# Patient Record
Sex: Female | Born: 1955 | Race: White | Hispanic: No | Marital: Single | Smoking: Never smoker
Health system: Southern US, Community
[De-identification: ages and names within clinical notes are randomized; demographics above are authoritative.]

## PROBLEM LIST (undated history)

## (undated) ENCOUNTER — Emergency Department (HOSPITAL_BASED_OUTPATIENT_CLINIC_OR_DEPARTMENT_OTHER): Admission: EM | Payer: Worker's Compensation | Source: Home / Self Care

## (undated) DIAGNOSIS — N390 Urinary tract infection, site not specified: Secondary | ICD-10-CM

## (undated) DIAGNOSIS — E119 Type 2 diabetes mellitus without complications: Secondary | ICD-10-CM

## (undated) DIAGNOSIS — G43909 Migraine, unspecified, not intractable, without status migrainosus: Secondary | ICD-10-CM

## (undated) DIAGNOSIS — K3184 Gastroparesis: Secondary | ICD-10-CM

## (undated) DIAGNOSIS — N186 End stage renal disease: Secondary | ICD-10-CM

## (undated) DIAGNOSIS — K7460 Unspecified cirrhosis of liver: Secondary | ICD-10-CM

## (undated) DIAGNOSIS — E1140 Type 2 diabetes mellitus with diabetic neuropathy, unspecified: Secondary | ICD-10-CM

## (undated) DIAGNOSIS — E785 Hyperlipidemia, unspecified: Secondary | ICD-10-CM

## (undated) DIAGNOSIS — N189 Chronic kidney disease, unspecified: Secondary | ICD-10-CM

## (undated) DIAGNOSIS — N3010 Interstitial cystitis (chronic) without hematuria: Secondary | ICD-10-CM

## (undated) HISTORY — PX: TOE SURGERY: SHX1073

## (undated) HISTORY — PX: ABDOMINAL HYSTERECTOMY: SHX81

## (undated) HISTORY — PX: ESOPHAGEAL DILATION: SHX303

## (undated) HISTORY — PX: APPENDECTOMY: SHX54

## (undated) HISTORY — PX: FINGER SURGERY: SHX640

## (undated) HISTORY — PX: ABDOMINAL SURGERY: SHX537

---

## 2010-02-18 ENCOUNTER — Emergency Department (HOSPITAL_BASED_OUTPATIENT_CLINIC_OR_DEPARTMENT_OTHER): Admission: EM | Admit: 2010-02-18 | Discharge: 2010-02-18 | Payer: Self-pay | Admitting: Emergency Medicine

## 2010-12-04 LAB — URINALYSIS, ROUTINE W REFLEX MICROSCOPIC
Bilirubin Urine: NEGATIVE
Glucose, UA: >1000 mg/dL — AB
Hgb urine dipstick: NEGATIVE
Specific Gravity, Urine: 1.027 (ref 1.005–1.030)
pH: 6 (ref 5.0–8.0)

## 2010-12-04 LAB — COMPREHENSIVE METABOLIC PANEL
ALT: 52 U/L — ABNORMAL HIGH (ref 0–35)
AST: 93 U/L — ABNORMAL HIGH (ref 0–37)
CO2: 22 mEq/L (ref 19–32)
Calcium: 8.6 mg/dL (ref 8.4–10.5)
Creatinine, Ser: 0.7 mg/dL (ref 0.4–1.2)
GFR calc Af Amer: >60 mL/min (ref >60)
GFR calc non Af Amer: >60 mL/min (ref >60)
Sodium: 136 mEq/L (ref 135–145)
Total Protein: 6.9 g/dL (ref 6.0–8.3)

## 2010-12-04 LAB — DIFFERENTIAL
Eosinophils Absolute: 0.1 10*3/uL (ref 0.0–0.7)
Lymphocytes Relative: 25 % (ref 12–46)
Lymphs Abs: 1.5 10*3/uL (ref 0.7–4.0)
Monocytes Relative: 7 % (ref 3–12)
Neutrophils Relative %: 66 % (ref 43–77)

## 2010-12-04 LAB — URINE MICROSCOPIC-ADD ON

## 2010-12-04 LAB — AMMONIA: Ammonia: 16 umol/L (ref 11–35)

## 2010-12-04 LAB — LIPASE, BLOOD: Lipase: 133 U/L (ref 23–300)

## 2010-12-04 LAB — GLUCOSE, CAPILLARY: Glucose-Capillary: 458 mg/dL — ABNORMAL HIGH (ref 70–99)

## 2010-12-04 LAB — CBC
MCHC: 34.2 g/dL (ref 30.0–36.0)
MCV: 90.1 fL (ref 78.0–100.0)
RDW: 13.2 % (ref 11.5–15.5)

## 2013-08-05 ENCOUNTER — Emergency Department (HOSPITAL_BASED_OUTPATIENT_CLINIC_OR_DEPARTMENT_OTHER)
Admission: EM | Admit: 2013-08-05 | Discharge: 2013-08-05 | Disposition: A | Discharge status: Home / Self Care | Payer: Medicare Other | Attending: Emergency Medicine | Admitting: Emergency Medicine

## 2013-08-05 ENCOUNTER — Encounter (HOSPITAL_BASED_OUTPATIENT_CLINIC_OR_DEPARTMENT_OTHER): Payer: Self-pay | Admitting: Emergency Medicine

## 2013-08-05 DIAGNOSIS — R000 Tachycardia, unspecified: Secondary | ICD-10-CM | POA: Insufficient documentation

## 2013-08-05 DIAGNOSIS — E119 Type 2 diabetes mellitus without complications: Secondary | ICD-10-CM | POA: Insufficient documentation

## 2013-08-05 DIAGNOSIS — G43909 Migraine, unspecified, not intractable, without status migrainosus: Secondary | ICD-10-CM | POA: Insufficient documentation

## 2013-08-05 DIAGNOSIS — E860 Dehydration: Secondary | ICD-10-CM | POA: Insufficient documentation

## 2013-08-05 DIAGNOSIS — Z8719 Personal history of other diseases of the digestive system: Secondary | ICD-10-CM | POA: Insufficient documentation

## 2013-08-05 DIAGNOSIS — M542 Cervicalgia: Secondary | ICD-10-CM | POA: Insufficient documentation

## 2013-08-05 HISTORY — DX: Type 2 diabetes mellitus without complications: E11.9

## 2013-08-05 HISTORY — DX: Gastroparesis: K31.84

## 2013-08-05 HISTORY — DX: Migraine, unspecified, not intractable, without status migrainosus: G43.909

## 2013-08-05 HISTORY — DX: Hyperlipidemia, unspecified: E78.5

## 2013-08-05 LAB — CBC WITH DIFFERENTIAL/PLATELET
Basophils Absolute: 0 10*3/uL (ref 0.0–0.1)
Eosinophils Absolute: 0 10*3/uL (ref 0.0–0.7)
Lymphocytes Relative: 16 % (ref 12–46)
MCHC: 33.3 g/dL (ref 30.0–36.0)
Monocytes Relative: 9 % (ref 3–12)
Neutrophils Relative %: 75 % (ref 43–77)
Platelets: 115 10*3/uL — ABNORMAL LOW (ref 150–400)
RDW: 13.1 % (ref 11.5–15.5)
Smear Review: ADEQUATE
WBC: 7.4 10*3/uL (ref 4.0–10.5)

## 2013-08-05 LAB — BASIC METABOLIC PANEL
BUN: 18 mg/dL (ref 6–23)
Chloride: 102 mEq/L (ref 96–112)
GFR calc Af Amer: 51 mL/min — ABNORMAL LOW (ref >90)
GFR calc non Af Amer: 44 mL/min — ABNORMAL LOW (ref >90)
Potassium: 3.6 mEq/L (ref 3.5–5.1)
Sodium: 137 mEq/L (ref 135–145)

## 2013-08-05 MED ORDER — DEXAMETHASONE SODIUM PHOSPHATE 10 MG/ML IJ SOLN
10.0000 mg | Freq: Once | INTRAMUSCULAR | Status: AC
  Administered 2013-08-05: 10 mg via INTRAVENOUS
  Filled 2013-08-05: qty 1

## 2013-08-05 MED ORDER — DIPHENHYDRAMINE HCL 50 MG/ML IJ SOLN
25.0000 mg | Freq: Once | INTRAMUSCULAR | Status: AC
  Administered 2013-08-05: 16:00:00 via INTRAVENOUS
  Filled 2013-08-05: qty 1

## 2013-08-05 MED ORDER — HYDROMORPHONE HCL PF 1 MG/ML IJ SOLN
INTRAMUSCULAR | Status: AC
  Administered 2013-08-05: 1 mg via INTRAVENOUS
  Filled 2013-08-05: qty 1

## 2013-08-05 MED ORDER — ONDANSETRON HCL 4 MG/2ML IJ SOLN: 4.0000 mg | Freq: Once | INTRAMUSCULAR | Status: DC

## 2013-08-05 MED ORDER — METOCLOPRAMIDE HCL 5 MG/ML IJ SOLN
10.0000 mg | Freq: Once | INTRAMUSCULAR | Status: AC
  Administered 2013-08-05: 10 mg via INTRAVENOUS
  Filled 2013-08-05: qty 2

## 2013-08-05 MED ORDER — HYDROMORPHONE HCL PF 1 MG/ML IJ SOLN
1.0000 mg | Freq: Once | INTRAMUSCULAR | Status: AC
  Administered 2013-08-05: 1 mg via INTRAVENOUS

## 2013-08-05 MED ORDER — SODIUM CHLORIDE 0.9 % IV BOLUS (SEPSIS)
1000.0000 mL | Freq: Once | INTRAVENOUS | Status: AC
  Administered 2013-08-05: 1000 mL via INTRAVENOUS

## 2013-08-05 NOTE — ED Provider Notes (Addendum)
CSN: [id number]     Arrival date & time 08/05/13  1505 History   First MD Initiated Contact with Patient 08/05/13 1520     Chief Complaint  Patient presents with  . Migraine   (Consider location/radiation/quality/duration/timing/severity/associated sxs/prior Treatment) Patient is a 57 y.o. female presenting with migraines. The history is provided by the patient.  Migraine This is a recurrent problem. Episode onset: 1 week ago. The problem occurs constantly. The problem has been gradually worsening. Associated symptoms include headaches. Pertinent negatives include no abdominal pain. Associated symptoms comments: N/V, neck pain, balance problems today when headache became severe.  States she has had headaches like this in the past but it has been awhile. The symptoms are aggravated by exertion, stress and eating (light). Nothing relieves the symptoms. Treatments tried: maxalt, ibuprofen, reglan. The treatment provided no relief.    Past Medical History  Diagnosis Date  . Migraine   . Diabetes mellitus without complication   . Gastroparesis   . Hyperlipemia    Past Surgical History  Procedure Laterality Date  . Abdominal hysterectomy    . Appendectomy    . Abdominal surgery     History reviewed. No pertinent family history. History  Substance Use Topics  . Smoking status: Never Smoker   . Smokeless tobacco: Not on file  . Alcohol Use: No   OB History   Grav Para Term Preterm Abortions TAB SAB Ect Mult Living                 Review of Systems  Constitutional: Negative for fever and chills.  Gastrointestinal: Positive for nausea and vomiting. Negative for abdominal pain.  Musculoskeletal: Positive for neck pain.  Neurological: Positive for headaches.  All other systems reviewed and are negative.    Allergies  Sulfa antibiotics  Home Medications  No current outpatient prescriptions on file. BP 154/77  Pulse 120  Temp(Src) 99.9 F (37.7 C)  Resp 18  Ht 5\' 2"  (1.575  m)  Wt 144 lb (65.318 kg)  BMI 26.33 kg/m2  SpO2 97% Physical Exam  Nursing note and vitals reviewed. Constitutional: She is oriented to person, place, and time. She appears well-developed and well-nourished. No distress.  HENT:  Head: Normocephalic and atraumatic.  Mouth/Throat: Oropharynx is clear and moist.  Eyes: Conjunctivae and EOM are normal. Pupils are equal, round, and reactive to light.  photophobia  Neck: Normal range of motion. Neck supple. No rigidity. Normal range of motion present. No Brudzinski's sign and no Kernig's sign noted.    Pt is able to completely flex and ext the neck without restriction.  No meningeal signs  Cardiovascular: Regular rhythm and intact distal pulses.  Tachycardia present.   No murmur heard. Pulmonary/Chest: Effort normal and breath sounds normal. No respiratory distress. She has no wheezes. She has no rales.  Abdominal: Soft. She exhibits no distension. There is no tenderness. There is no rebound and no guarding.  Musculoskeletal: Normal range of motion. She exhibits no edema and no tenderness.  Neurological: She is alert and oriented to person, place, and time. She has normal strength. No cranial nerve deficit or sensory deficit.  Skin: Skin is warm and dry. No rash noted. No erythema.  Psychiatric: She has a normal mood and affect. Her behavior is normal.    ED Course  Procedures (including critical care time) Labs Review Labs Reviewed  CBC WITH DIFFERENTIAL - Abnormal; Notable for the following:    Hemoglobin 11.5 (*)    HCT 34.5 (*)  Platelets 115 (*)    All other components within normal limits  BASIC METABOLIC PANEL - Abnormal; Notable for the following:    Glucose, Bld 135 (*)    Creatinine, Ser 1.30 (*)    GFR calc non Af Amer 44 (*)    GFR calc Af Amer 51 (*)    All other components within normal limits   Imaging Review No results found.  EKG Interpretation   None       MDM   1. Migraine   2. Dehydration      Pt withhx of migraine HA for years who not has had HA for 1week that continues to worsen with trouble with balance and N/V without sx suggestive of SAH(sudden onset, worst of life, or deficits), infection, or cavernous vein thrombosis.  Normal neuro exam.  Tachycardic here at 120 and appears dehydrated.  Pt denies fever at home 99.9 here. Will give HA cocktail and given extent of HA and hx of DM with gastroparesis and N/V will check CBC, BMP to ensure normal electrolytes.  4:34 PM Labs wnl.  After fluids and HA cocktail and pain medications pt feels much better.  She is eating and drinking and now has no complaints.  Tachycardia improved.   Blanchie Dessert, MD 08/05/13 1635  Blanchie Dessert, MD 08/05/13 [place], MD 08/05/13 1654

## 2013-08-05 NOTE — ED Notes (Signed)
Pt c/o " migraine" x 1 week

## 2013-08-07 ENCOUNTER — Emergency Department (HOSPITAL_BASED_OUTPATIENT_CLINIC_OR_DEPARTMENT_OTHER): Payer: Medicare Other

## 2013-08-07 ENCOUNTER — Encounter (HOSPITAL_BASED_OUTPATIENT_CLINIC_OR_DEPARTMENT_OTHER): Payer: Self-pay | Admitting: Emergency Medicine

## 2013-08-07 ENCOUNTER — Emergency Department (HOSPITAL_BASED_OUTPATIENT_CLINIC_OR_DEPARTMENT_OTHER)
Admission: EM | Admit: 2013-08-07 | Discharge: 2013-08-07 | Disposition: A | Discharge status: Other Acute Inpatient Hospital | Payer: Medicare Other | Attending: Emergency Medicine | Admitting: Emergency Medicine

## 2013-08-07 DIAGNOSIS — G43909 Migraine, unspecified, not intractable, without status migrainosus: Secondary | ICD-10-CM | POA: Insufficient documentation

## 2013-08-07 DIAGNOSIS — Z79899 Other long term (current) drug therapy: Secondary | ICD-10-CM | POA: Insufficient documentation

## 2013-08-07 DIAGNOSIS — E119 Type 2 diabetes mellitus without complications: Secondary | ICD-10-CM | POA: Insufficient documentation

## 2013-08-07 DIAGNOSIS — R509 Fever, unspecified: Secondary | ICD-10-CM | POA: Insufficient documentation

## 2013-08-07 DIAGNOSIS — E785 Hyperlipidemia, unspecified: Secondary | ICD-10-CM | POA: Insufficient documentation

## 2013-08-07 DIAGNOSIS — Z8719 Personal history of other diseases of the digestive system: Secondary | ICD-10-CM | POA: Insufficient documentation

## 2013-08-07 HISTORY — DX: Unspecified cirrhosis of liver: K74.60

## 2013-08-07 LAB — URINE MICROSCOPIC-ADD ON

## 2013-08-07 LAB — CBC WITH DIFFERENTIAL/PLATELET
Basophils Absolute: 0 10*3/uL (ref 0.0–0.1)
Eosinophils Relative: 1 % (ref 0–5)
HCT: 31.5 % — ABNORMAL LOW (ref 36.0–46.0)
Lymphocytes Relative: 13 % (ref 12–46)
Lymphs Abs: 0.6 10*3/uL — ABNORMAL LOW (ref 0.7–4.0)
MCHC: 32.7 g/dL (ref 30.0–36.0)
Monocytes Relative: 13 % — ABNORMAL HIGH (ref 3–12)
Neutro Abs: 3.4 10*3/uL (ref 1.7–7.7)
Neutrophils Relative %: 73 % (ref 43–77)
Platelets: 111 10*3/uL — ABNORMAL LOW (ref 150–400)
RBC: 3.56 MIL/uL — ABNORMAL LOW (ref 3.87–5.11)
RDW: 13.1 % (ref 11.5–15.5)
WBC: 4.6 10*3/uL (ref 4.0–10.5)

## 2013-08-07 LAB — COMPREHENSIVE METABOLIC PANEL
AST: 30 U/L (ref 0–37)
Albumin: 3.2 g/dL — ABNORMAL LOW (ref 3.5–5.2)
Alkaline Phosphatase: 83 U/L (ref 39–117)
BUN: 14 mg/dL (ref 6–23)
Chloride: 102 mEq/L (ref 96–112)
Glucose, Bld: 155 mg/dL — ABNORMAL HIGH (ref 70–99)
Potassium: 3.4 mEq/L — ABNORMAL LOW (ref 3.5–5.1)
Total Bilirubin: 0.3 mg/dL (ref 0.3–1.2)
Total Protein: 6.5 g/dL (ref 6.0–8.3)

## 2013-08-07 LAB — URINALYSIS, ROUTINE W REFLEX MICROSCOPIC
Ketones, ur: NEGATIVE mg/dL
Nitrite: POSITIVE — AB
pH: 5.5 (ref 5.0–8.0)

## 2013-08-07 LAB — GLUCOSE, CAPILLARY: Glucose-Capillary: 133 mg/dL — ABNORMAL HIGH (ref 70–99)

## 2013-08-07 LAB — CG4 I-STAT (LACTIC ACID): Lactic Acid, Venous: 2.89 mmol/L — ABNORMAL HIGH (ref 0.5–2.2)

## 2013-08-07 MED ORDER — IBUPROFEN 400 MG PO TABS
400.0000 mg | ORAL_TABLET | Freq: Once | ORAL | Status: AC
  Administered 2013-08-07: 400 mg via ORAL
  Filled 2013-08-07: qty 1

## 2013-08-07 MED ORDER — IBUPROFEN 400 MG PO TABS
400.0000 mg | ORAL_TABLET | Freq: Once | ORAL | Status: AC
  Administered 2013-08-07: 400 mg via ORAL

## 2013-08-07 MED ORDER — VANCOMYCIN HCL IN DEXTROSE 1-5 GM/200ML-% IV SOLN
1000.0000 mg | Freq: Once | INTRAVENOUS | Status: AC
  Administered 2013-08-07: 1000 mg via INTRAVENOUS
  Filled 2013-08-07: qty 200

## 2013-08-07 MED ORDER — CEFTRIAXONE SODIUM 2 G IJ SOLR
INTRAMUSCULAR | Status: AC
  Filled 2013-08-07: qty 2

## 2013-08-07 MED ORDER — ACETAMINOPHEN 325 MG PO TABS
650.0000 mg | ORAL_TABLET | Freq: Once | ORAL | Status: AC
  Administered 2013-08-07: 650 mg via ORAL
  Filled 2013-08-07: qty 2

## 2013-08-07 MED ORDER — SODIUM CHLORIDE 0.9 % IV BOLUS (SEPSIS)
1000.0000 mL | Freq: Once | INTRAVENOUS | Status: AC
  Administered 2013-08-07: 1000 mL via INTRAVENOUS

## 2013-08-07 MED ORDER — DEXTROSE 5 % IV SOLN
2.0000 g | Freq: Once | INTRAVENOUS | Status: AC
  Administered 2013-08-07: 2 g via INTRAVENOUS

## 2013-08-07 MED ORDER — SODIUM CHLORIDE 0.9 % IV SOLN
INTRAVENOUS | Status: DC
  Administered 2013-08-07 (×2): via INTRAVENOUS

## 2013-08-07 NOTE — ED Provider Notes (Addendum)
CSN: [id number]     Arrival date & time 08/07/13  0112 History   First MD Initiated Contact with Patient 08/07/13 0134     Chief Complaint  Patient presents with  . Altered Mental Status   (Consider location/radiation/quality/duration/timing/severity/associated sxs/prior Treatment) HPI Level 5 Caveat: altered mental status.  This is a 57 year old female with a history of diabetes and migraine headache. She also has a history of a bladder implant and has to self catheterize herself. Her roommate states that she has had a prolonged history of vomiting after eating. She's been diagnosed with gastroparesis. For the past week she has been weaker than usual.  She was seen here 2 days ago for a headache and was treated as a migraine with relief of her headache. She was noted to have a temperature of 99.9 at that time.   Her roommate brings her to the ED this morning and due to a fever of 103.7 and altered mental status which she first noticed about 2 hours ago. The altered mental status manifests itself as lethargy and changing the subject in the middle of sentences. Her temperature was noted to be 104.3 on arrival with a pulse rate of 132. She has not been vomiting this morning. She denies headache, stiff neck, shortness of breath, chest pain or abdominal pain. She has not had a bowel movement in several days her roommate attributes to persistent vomiting after eating.    Past Medical History  Diagnosis Date  . Migraine   . Diabetes mellitus without complication   . Gastroparesis   . Hyperlipemia    Past Surgical History  Procedure Laterality Date  . Abdominal hysterectomy    . Appendectomy    . Abdominal surgery     No family history on file. History  Substance Use Topics  . Smoking status: Never Smoker   . Smokeless tobacco: Not on file  . Alcohol Use: No   OB History   Grav Para Term Preterm Abortions TAB SAB Ect Mult Living                 Review of Systems  Unable to  perform ROS   Allergies  Sulfa antibiotics  Home Medications   Current Outpatient Rx  Name  Route  Sig  Dispense  Refill  . buPROPion (WELLBUTRIN SR) 100 MG 12 hr tablet   Oral   Take 200 mg by mouth 2 (two) times daily.         Marland Kitchen buPROPion (WELLBUTRIN) 100 MG tablet   Oral   Take 100 mg by mouth 2 (two) times daily.         . clonazePAM (KLONOPIN) 0.5 MG tablet   Oral   Take 0.5 mg by mouth as directed. .5mg  tablet 1-2 tablets in am, 1 tablet in afternoon, then 1 tablet at bedtime         . ibuprofen (ADVIL,MOTRIN) 200 MG tablet   Oral   Take 200 mg by mouth every 6 (six) hours as needed.         . lamoTRIgine (LAMICTAL) 100 MG tablet   Oral   Take 100 mg by mouth 2 (two) times daily.         . pravastatin (PRAVACHOL) 20 MG tablet   Oral   Take 20 mg by mouth daily.         . pregabalin (LYRICA) 150 MG capsule   Oral   Take 150 mg by mouth 2 (two) times daily.         Marland Kitchen  sitaGLIPtin-metformin (JANUMET) 50-1000 MG per tablet   Oral   Take 1 tablet by mouth 2 (two) times daily with a meal.          BP 124/50  Pulse 120  Temp(Src) 103.2 F (39.6 C) (Rectal)  Resp 18  Wt 144 lb (65.318 kg)  SpO2 97%  Physical Exam General: Well-developed, well-nourished female in no acute distress; appearance consistent with age of record HENT: normocephalic; atraumatic Eyes: pupils equal, round and reactive to light; extraocular muscles intact Neck: supple; no meningeal signs Heart: regular rate and rhythm; tachycardia Lungs: clear to auscultation bilaterally Abdomen: soft; nondistended; suprapubic tenderness; no masses or hepatosplenomegaly; bowel sounds present Extremities: No deformity; full range of motion; pulses normal; no muscle rigidity Neurologic: Awake but lethargic; oriented to person, place, day of week and month; motor function intact in all extremities and symmetric with generalized weakness; no facial droop; answers questions inappropriately with  changing of subject midsentence Skin: Warm and dry    ED Course  Procedures (including critical care time)  LUMBAR PUNCTURE The patient was placed upright and bending forward. She was prepped and draped in the usual sterile fashion. Landmarks were identified by palpating the iliac crests. LP site was anesthetized with 2 mL of 1% lidocaine. A standard spinal needle was used to enter the L3/4 vertebral interspace. Several attempts were made but only gross blood was obtained which did not halo. The patient tolerated this well and there were no immediate complications.  CRITICAL CARE Performed by: Shanon Rosser L Total critical care time: 30 minutes Critical care time was exclusive of separately billable procedures and treating other patients. Critical care was necessary to treat or prevent imminent or life-threatening deterioration. Critical care was time spent personally by me on the following activities: development of treatment plan with patient and/or surrogate as well as nursing, discussions with consultants, evaluation of patient's response to treatment, examination of patient, obtaining history from patient or surrogate, ordering and performing treatments and interventions, ordering and review of laboratory studies, ordering and review of radiographic studies, pulse oximetry and re-evaluation of patient's condition.    MDM   Nursing notes and vitals signs, including pulse oximetry, reviewed.  Summary of this visit's results, reviewed by myself:  Labs:  Results for orders placed during the hospital encounter of 08/07/13 (from the past 24 hour(s))  CBC WITH DIFFERENTIAL     Status: Abnormal   Collection Time    08/07/13  1:35 AM      Result Value Range   WBC 4.6  4.0 - 10.5 K/uL   RBC 3.56 (*) 3.87 - 5.11 MIL/uL   Hemoglobin 10.3 (*) 12.0 - 15.0 g/dL   HCT 31.5 (*) 36.0 - 46.0 %   MCV 88.5  78.0 - 100.0 fL   MCH 28.9  26.0 - 34.0 pg   MCHC 32.7  30.0 - 36.0 g/dL   RDW 13.1  11.5  - 15.5 %   Platelets 111 (*) 150 - 400 K/uL   Neutrophils Relative % 73  43 - 77 %   Neutro Abs 3.4  1.7 - 7.7 K/uL   Lymphocytes Relative 13  12 - 46 %   Lymphs Abs 0.6 (*) 0.7 - 4.0 K/uL   Monocytes Relative 13 (*) 3 - 12 %   Monocytes Absolute 0.6  0.1 - 1.0 K/uL   Eosinophils Relative 1  0 - 5 %   Eosinophils Absolute 0.0  0.0 - 0.7 K/uL   Basophils Relative 0  0 -  1 %   Basophils Absolute 0.0  0.0 - 0.1 K/uL   WBC Morphology WHITE COUNT CONFIRMED ON SMEAR     Smear Review PLATELET COUNT CONFIRMED BY SMEAR    COMPREHENSIVE METABOLIC PANEL     Status: Abnormal   Collection Time    08/07/13  1:35 AM      Result Value Range   Sodium 136  135 - 145 mEq/L   Potassium 3.4 (*) 3.5 - 5.1 mEq/L   Chloride 102  96 - 112 mEq/L   CO2 17 (*) 19 - 32 mEq/L   Glucose, Bld 155 (*) 70 - 99 mg/dL   BUN 14  6 - 23 mg/dL   Creatinine, Ser 1.10  0.50 - 1.10 mg/dL   Calcium 7.9 (*) 8.4 - 10.5 mg/dL   Total Protein 6.5  6.0 - 8.3 g/dL   Albumin 3.2 (*) 3.5 - 5.2 g/dL   AST 30  0 - 37 U/L   ALT 20  0 - 35 U/L   Alkaline Phosphatase 83  39 - 117 U/L   Total Bilirubin 0.3  0.3 - 1.2 mg/dL   GFR calc non Af Amer 54 (*) >90 mL/min   GFR calc Af Amer 62 (*) >90 mL/min  CG4 I-STAT (LACTIC ACID)     Status: Abnormal   Collection Time    08/07/13  1:43 AM      Result Value Range   Lactic Acid, Venous 2.89 (*) 0.5 - 2.2 mmol/L  GLUCOSE, CAPILLARY     Status: Abnormal   Collection Time    08/07/13  1:43 AM      Result Value Range   Glucose-Capillary 133 (*) 70 - 99 mg/dL  URINALYSIS, ROUTINE W REFLEX MICROSCOPIC     Status: Abnormal   Collection Time    08/07/13  1:57 AM      Result Value Range   Color, Urine YELLOW  YELLOW   APPearance CLEAR  CLEAR   Specific Gravity, Urine 1.014  1.005 - 1.030   pH 5.5  5.0 - 8.0   Glucose, UA NEGATIVE  NEGATIVE mg/dL   Hgb urine dipstick NEGATIVE  NEGATIVE   Bilirubin Urine NEGATIVE  NEGATIVE   Ketones, ur NEGATIVE  NEGATIVE mg/dL   Protein, ur NEGATIVE   NEGATIVE mg/dL   Urobilinogen, UA 0.2  0.0 - 1.0 mg/dL   Nitrite POSITIVE (*) NEGATIVE   Leukocytes, UA SMALL (*) NEGATIVE  URINE MICROSCOPIC-ADD ON     Status: Abnormal   Collection Time    08/07/13  1:57 AM      Result Value Range   Squamous Epithelial / LPF RARE  RARE   WBC, UA 3-6  <3 WBC/hpf   RBC / HPF 0-2  <3 RBC/hpf   Bacteria, UA MANY (*) RARE    Imaging Studies: Ct Head Wo Contrast  08/07/2013   CLINICAL DATA:  Altered mental status, severe headache  EXAM: CT HEAD WITHOUT CONTRAST  TECHNIQUE: Contiguous axial images were obtained from the base of the skull through the vertex without intravenous contrast.  COMPARISON:  Prior CT from 06/12/2012  FINDINGS: No acute intracranial hemorrhage or infarct identified. No mass or midline shift. CSF containing spaces are within normal limits. No hydrocephalus. Gray-white matter differentiation is maintained. No extra-axial fluid collection.  Orbits are normal. Calvarium is intact. Paranasal sinuses and mastoid air cells are clear.  IMPRESSION: Normal head CT with no acute intracranial process.   Electronically Signed   By: Jeannine Boga  M.D.   On: 08/07/2013 02:56   Dg Chest Port 1 View  08/07/2013   CLINICAL DATA:  Fever.  Altered mental status  EXAM: PORTABLE CHEST - 1 VIEW  COMPARISON:  None.  Priors not currently available.  FINDINGS: The heart size and mediastinal contours are within normal limits. Both lungs are clear. The visualized skeletal structures are unremarkable.  IMPRESSION: Negative for pneumonia.   Electronically Signed   By: Jorje Guild M.D.   On: 08/07/2013 02:46   3:39 AM Rocephin 2 g IV given for possible meningitis. Vancomycin will be added. Lumbar puncture was unsuccessful. We will have the patient admitted to [facility]. Discussed with Dr. Florene Glen.  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 The hyperthermia and lack of  leukocytosis would be consistent with serotonin syndrome but the fevers response to ibuprofen would be unexpected. Also the patient did not exhibit muscular rigidity.   4:23 AM Mentation has improved following defervescence. No meningeal signs present.  6:04 AM Fever 101.9, patient oriented and appropriate.  7:00 AM Stable. Awaiting bed at [facility].    Wynetta Fines, MD 08/07/13 0345  Wynetta Fines, MD 08/07/13 [id number]  Wynetta Fines, MD 08/07/13 [phone]

## 2013-08-07 NOTE — ED Notes (Signed)
Pt seen here 2 days ago for migraine. Family member sts that pt has had a fever and has been delirious since apprx. midnight tonight. Pt woke her family member and stated that she had dropped some papers and fell but there were no papers on the floor. Pt able to answer questions and follow commands.

## 2013-08-07 NOTE — ED Notes (Signed)
LP attempted by EDP. Procedure unsuccessful. Patient tolerated procedure well. Denies pain at procedure site.

## 2013-08-07 NOTE — ED Notes (Signed)
Pt care assumed, pt and companion sleeping, arouse to spoken name, pt is awake and alert, denies any c/o, oriented x 4. Given ginger ale per request, updated on plan of care.

## 2013-08-07 NOTE — ED Provider Notes (Signed)
10:04 AM  Pt seen by Dr. Florina Ou overnight.  Pt is a 57 y.o. female with history of migraines, diabetes, hyperlipidemia who presented to the emergency department overnight with fever and altered mental status. Labs have been unremarkable.  Lumbar puncture was attempted without success. Urine does show positive urinary tract infection. She has received vancomycin and ceftriaxone. Patient's altered mental status has completely resolved after treatment of her fever. She is currently hemodynamically stable, alert oriented x3 without complaints. She is awaiting transfer to [facility].  11:03 AM  Patient is still waiting for a bed at [place] with Dr. Mcarthur Rossetti, hospitalist at [facility]. Given patient has no meningismus on exam in her symptoms have improved and she has nitrite positive urinary tract infection which is likely the cause of her fever and altered mental status, he will discuss with his infectious disease colleagues and likely admit patient to a non-isolation room which we'll expedite transfer.  12:00 PM  Pt to go to non-isolation room.  [place], DO 08/07/13 1343

## 2013-08-07 NOTE — ED Notes (Signed)
Pt was transferred to [facility] at 1305. Epic will not allow me to d/c pt at actual departure time due to need for pain score within one hour. Pt left at 1305.

## 2013-08-09 LAB — URINE CULTURE: Colony Count: >=100000

## 2013-08-10 NOTE — ED Notes (Signed)
Culture report reviewed and completed by Pharmacy.

## 2013-08-10 NOTE — Progress Notes (Signed)
ED Antimicrobial Stewardship Positive Culture Follow Up   Tina Ramos is an 57 y.o. female who presented to [facility] on 08/07/2013 with a chief complaint of  Chief Complaint  Patient presents with  . Altered Mental Status    Recent Results (from the past 720 hour(s))  CULTURE, BLOOD (ROUTINE X 2)     Status: None   Collection Time    08/07/13  1:35 AM      Result Value Range Status   Specimen Description BLOOD RIGHT ARM   Final   Special Requests BOTTLES DRAWN AEROBIC AND ANAEROBIC 5CC EACH   Final   Culture  Setup Time     Final   Value: 08/07/2013 09:19     Performed at [facility]   Culture     Final   Value:        BLOOD CULTURE RECEIVED NO GROWTH TO DATE CULTURE WILL BE HELD FOR 5 DAYS BEFORE ISSUING A FINAL NEGATIVE REPORT     Performed at [facility]   Report Status PENDING   Incomplete  CULTURE, BLOOD (ROUTINE X 2)     Status: None   Collection Time    08/07/13  1:50 AM      Result Value Range Status   Specimen Description BLOOD LEFT ARM   Final   Special Requests BOTTLES DRAWN AEROBIC AND ANAEROBIC 10 ML EACH   Final   Culture  Setup Time     Final   Value: 08/07/2013 09:19     Performed at [facility]   Culture     Final   Value:        BLOOD CULTURE RECEIVED NO GROWTH TO DATE CULTURE WILL BE HELD FOR 5 DAYS BEFORE ISSUING A FINAL NEGATIVE REPORT     Performed at [facility]   Report Status PENDING   Incomplete  URINE CULTURE     Status: None   Collection Time    08/07/13  1:57 AM      Result Value Range Status   Specimen Description URINE, CATHETERIZED   Final   Special Requests NONE   Final   Culture  Setup Time     Final   Value: 08/07/2013 09:16     Performed at [place]     Final   Value: >=100,000 COLONIES/ML     Performed at [facility]   Culture     Final   Value: ESCHERICHIA COLI     Performed at [facility]   Report Status 08/09/2013 FINAL   Final   Organism  ID, Bacteria ESCHERICHIA COLI   Final   Patient presented to [facility] ED with AMS and fever. During ED visit, patient received Vancomycin 1g and Ceftriaxone 2g IV. LP was attempted but was unsuccessful. Per notes, patient's mental status improved after fever trended down. Patient was transferred and admitted to [place]: Spoke with Caralyn Guile (pharmacist at [facility])  1. Faxed and confirmed receipt of culture results - culture will placed on chart and pharmacist will communicate with MD 2. Confirmed patient has been receiving Ceftriaxone during hospitalization (will cover Ecoli organism)   ED Provider: Margarita Mail, PA-C   Earleen Newport 08/10/2013, 4:14 PM Infectious Diseases Pharmacist Phone# [phone]

## 2013-08-13 LAB — CULTURE, BLOOD (ROUTINE X 2)
Culture: NO GROWTH
Culture: NO GROWTH

## 2013-09-23 ENCOUNTER — Encounter (HOSPITAL_BASED_OUTPATIENT_CLINIC_OR_DEPARTMENT_OTHER): Payer: Self-pay | Admitting: Emergency Medicine

## 2013-09-23 ENCOUNTER — Emergency Department (HOSPITAL_BASED_OUTPATIENT_CLINIC_OR_DEPARTMENT_OTHER)
Admission: EM | Admit: 2013-09-23 | Discharge: 2013-09-23 | Disposition: A | Discharge status: Home / Self Care | Payer: Medicare HMO | Attending: Emergency Medicine | Admitting: Emergency Medicine

## 2013-09-23 DIAGNOSIS — E119 Type 2 diabetes mellitus without complications: Secondary | ICD-10-CM | POA: Insufficient documentation

## 2013-09-23 DIAGNOSIS — Z794 Long term (current) use of insulin: Secondary | ICD-10-CM | POA: Insufficient documentation

## 2013-09-23 DIAGNOSIS — Z87440 Personal history of urinary (tract) infections: Secondary | ICD-10-CM | POA: Insufficient documentation

## 2013-09-23 DIAGNOSIS — Z8719 Personal history of other diseases of the digestive system: Secondary | ICD-10-CM | POA: Insufficient documentation

## 2013-09-23 DIAGNOSIS — Z79899 Other long term (current) drug therapy: Secondary | ICD-10-CM | POA: Insufficient documentation

## 2013-09-23 DIAGNOSIS — R519 Headache, unspecified: Secondary | ICD-10-CM

## 2013-09-23 DIAGNOSIS — E785 Hyperlipidemia, unspecified: Secondary | ICD-10-CM | POA: Insufficient documentation

## 2013-09-23 DIAGNOSIS — G43909 Migraine, unspecified, not intractable, without status migrainosus: Secondary | ICD-10-CM | POA: Insufficient documentation

## 2013-09-23 DIAGNOSIS — Z91040 Latex allergy status: Secondary | ICD-10-CM | POA: Insufficient documentation

## 2013-09-23 DIAGNOSIS — R51 Headache: Secondary | ICD-10-CM

## 2013-09-23 HISTORY — DX: Urinary tract infection, site not specified: N39.0

## 2013-09-23 MED ORDER — LORAZEPAM 2 MG/ML IJ SOLN
1.0000 mg | Freq: Once | INTRAMUSCULAR | Status: AC
  Administered 2013-09-23: 1 mg via INTRAMUSCULAR
  Filled 2013-09-23: qty 1

## 2013-09-23 MED ORDER — HYDROMORPHONE HCL PF 1 MG/ML IJ SOLN
1.0000 mg | Freq: Once | INTRAMUSCULAR | Status: AC
  Administered 2013-09-23: 1 mg via INTRAMUSCULAR
  Filled 2013-09-23: qty 1

## 2013-09-23 MED ORDER — KETOROLAC TROMETHAMINE 60 MG/2ML IM SOLN
60.0000 mg | Freq: Once | INTRAMUSCULAR | Status: AC
  Administered 2013-09-23: 60 mg via INTRAMUSCULAR
  Filled 2013-09-23: qty 2

## 2013-09-23 MED ORDER — PROMETHAZINE HCL 25 MG/ML IJ SOLN
25.0000 mg | Freq: Once | INTRAMUSCULAR | Status: AC
  Administered 2013-09-23: 25 mg via INTRAMUSCULAR
  Filled 2013-09-23: qty 1

## 2013-09-23 NOTE — ED Notes (Signed)
Migraine HA x 3 days-NAD-steady gait into triage

## 2013-09-23 NOTE — ED Provider Notes (Signed)
CSN: [id number]     Arrival date & time 09/23/13  1558 History   First MD Initiated Contact with Patient 09/23/13 1758     Chief Complaint  Patient presents with  . Migraine   (Consider location/radiation/quality/duration/timing/severity/associated sxs/prior Treatment) Patient is a 58 y.o. female presenting with migraines. The history is provided by the patient.  Migraine This is a new problem. The current episode started in the past 7 days. The problem occurs constantly. The problem has been gradually worsening. Nothing aggravates the symptoms. She has tried nothing for the symptoms. The treatment provided no relief.  Pt reports she has migrine headaches.  Pt complains of a migraine for 3 days  Past Medical History  Diagnosis Date  . Migraine   . Diabetes mellitus without complication   . Gastroparesis   . Hyperlipemia   . Cirrhosis   . UTI (lower urinary tract infection)    Past Surgical History  Procedure Laterality Date  . Abdominal hysterectomy    . Appendectomy    . Abdominal surgery     No family history on file. History  Substance Use Topics  . Smoking status: Never Smoker   . Smokeless tobacco: Not on file  . Alcohol Use: No   OB History   Grav Para Term Preterm Abortions TAB SAB Ect Mult Living                 Review of Systems  All other systems reviewed and are negative.    Allergies  Darvon; Latex; Sulfa antibiotics; and Talwin  Home Medications   Current Outpatient Rx  Name  Route  Sig  Dispense  Refill  . insulin glargine (LANTUS) 100 UNIT/ML injection   Subcutaneous   Inject into the skin at bedtime.         Marland Kitchen QUEtiapine Fumarate (SEROQUEL PO)   Oral   Take by mouth.         Marland Kitchen buPROPion (WELLBUTRIN SR) 100 MG 12 hr tablet   Oral   Take 200 mg by mouth 2 (two) times daily.         Marland Kitchen buPROPion (WELLBUTRIN) 100 MG tablet   Oral   Take 100 mg by mouth 2 (two) times daily.         . clonazePAM (KLONOPIN) 0.5 MG tablet   Oral  Take 0.5 mg by mouth as directed. .5mg  tablet 1-2 tablets in am, 1 tablet in afternoon, then 1 tablet at bedtime         . ibuprofen (ADVIL,MOTRIN) 200 MG tablet   Oral   Take 200 mg by mouth every 6 (six) hours as needed.         . lamoTRIgine (LAMICTAL) 100 MG tablet   Oral   Take 100 mg by mouth 2 (two) times daily.         . pravastatin (PRAVACHOL) 20 MG tablet   Oral   Take 20 mg by mouth daily.         . pregabalin (LYRICA) 150 MG capsule   Oral   Take 150 mg by mouth 2 (two) times daily.         . sitaGLIPtin-metformin (JANUMET) 50-1000 MG per tablet   Oral   Take 1 tablet by mouth 2 (two) times daily with a meal.          BP 160/74  Pulse 106  Temp(Src) 98.1 F (36.7 C) (Oral)  Resp 16  SpO2 98% Physical Exam  Nursing note and vitals  reviewed. Constitutional: She is oriented to person, place, and time. She appears well-developed and well-nourished.  HENT:  Head: Normocephalic.  Right Ear: External ear normal.  Left Ear: External ear normal.  Eyes: Conjunctivae and EOM are normal. Pupils are equal, round, and reactive to light.  Neck: Normal range of motion. Neck supple.  Cardiovascular: Normal rate and regular rhythm.   Pulmonary/Chest: Effort normal and breath sounds normal.  Abdominal: Soft. She exhibits no distension.  Musculoskeletal: Normal range of motion.  Neurological: She is alert and oriented to person, place, and time.  Skin: Skin is warm.  Psychiatric: She has a normal mood and affect.    ED Course  Procedures (including critical care time) Labs Review Labs Reviewed - No data to display Imaging Review No results found.  EKG Interpretation   None       MDM  No diagnosis found. Pt feels better with torodol, phenergan and dilaudid.  Pt complains of continued spasm neck and right arm.  Pt given Ativan im.   Pt advised to see her Md tomorrow for recheck    Fransico Meadow, [place] 09/23/13 1913

## 2013-09-23 NOTE — Discharge Instructions (Signed)

## 2013-09-23 NOTE — ED Provider Notes (Signed)
Medical screening examination/treatment/procedure(s) were performed by non-physician practitioner and as supervising physician I was immediately available for consultation/collaboration.  EKG Interpretation   None         Blanchie Dessert, MD 09/23/13 2328

## 2013-10-26 ENCOUNTER — Emergency Department (HOSPITAL_BASED_OUTPATIENT_CLINIC_OR_DEPARTMENT_OTHER)
Admission: EM | Admit: 2013-10-26 | Discharge: 2013-10-26 | Disposition: A | Discharge status: Home / Self Care | Payer: Medicare HMO | Attending: Emergency Medicine | Admitting: Emergency Medicine

## 2013-10-26 ENCOUNTER — Encounter (HOSPITAL_BASED_OUTPATIENT_CLINIC_OR_DEPARTMENT_OTHER): Payer: Self-pay | Admitting: Emergency Medicine

## 2013-10-26 DIAGNOSIS — R350 Frequency of micturition: Secondary | ICD-10-CM | POA: Insufficient documentation

## 2013-10-26 DIAGNOSIS — R109 Unspecified abdominal pain: Secondary | ICD-10-CM | POA: Insufficient documentation

## 2013-10-26 DIAGNOSIS — R300 Dysuria: Secondary | ICD-10-CM | POA: Insufficient documentation

## 2013-10-26 DIAGNOSIS — Z79899 Other long term (current) drug therapy: Secondary | ICD-10-CM | POA: Insufficient documentation

## 2013-10-26 DIAGNOSIS — E119 Type 2 diabetes mellitus without complications: Secondary | ICD-10-CM | POA: Insufficient documentation

## 2013-10-26 DIAGNOSIS — E785 Hyperlipidemia, unspecified: Secondary | ICD-10-CM | POA: Insufficient documentation

## 2013-10-26 DIAGNOSIS — G43909 Migraine, unspecified, not intractable, without status migrainosus: Secondary | ICD-10-CM | POA: Insufficient documentation

## 2013-10-26 DIAGNOSIS — Z91040 Latex allergy status: Secondary | ICD-10-CM | POA: Insufficient documentation

## 2013-10-26 DIAGNOSIS — Z8719 Personal history of other diseases of the digestive system: Secondary | ICD-10-CM | POA: Insufficient documentation

## 2013-10-26 DIAGNOSIS — Z794 Long term (current) use of insulin: Secondary | ICD-10-CM | POA: Insufficient documentation

## 2013-10-26 DIAGNOSIS — Z87440 Personal history of urinary (tract) infections: Secondary | ICD-10-CM | POA: Insufficient documentation

## 2013-10-26 LAB — URINALYSIS, ROUTINE W REFLEX MICROSCOPIC
BILIRUBIN URINE: NEGATIVE
Glucose, UA: NEGATIVE mg/dL
Hgb urine dipstick: NEGATIVE
Ketones, ur: NEGATIVE mg/dL
Leukocytes, UA: NEGATIVE
NITRITE: NEGATIVE
Protein, ur: NEGATIVE mg/dL
SPECIFIC GRAVITY, URINE: 1.022 (ref 1.005–1.030)
Urobilinogen, UA: 0.2 mg/dL (ref 0.0–1.0)
pH: 5.5 (ref 5.0–8.0)

## 2013-10-26 MED ORDER — NITROFURANTOIN MONOHYD MACRO 100 MG PO CAPS: 100.0000 mg | ORAL_CAPSULE | Freq: Two times a day (BID) | ORAL | Status: DC

## 2013-10-26 MED ORDER — NITROFURANTOIN MONOHYD MACRO 100 MG PO CAPS: 100.0000 mg | ORAL_CAPSULE | Freq: Once | ORAL | Status: DC

## 2013-10-26 MED ORDER — PHENAZOPYRIDINE HCL 100 MG PO TABS
100.0000 mg | ORAL_TABLET | Freq: Once | ORAL | Status: AC
  Administered 2013-10-26: 100 mg via ORAL
  Filled 2013-10-26: qty 1

## 2013-10-26 MED ORDER — PHENAZOPYRIDINE HCL 200 MG PO TABS: 200.0000 mg | ORAL_TABLET | Freq: Three times a day (TID) | ORAL | Status: DC

## 2013-10-26 NOTE — Discharge Instructions (Signed)
Your urine culture will take 2-3 days to determine the presence of infection. Recheck with her urologist at your scheduled appointment. They can review culture results. Take the Macrobid for a minimum of 3 days, or until your symptoms are resolved.  Dysuria Dysuria is the medical term for pain with urination. There are many causes for dysuria, but urinary tract infection is the most common. If a urinalysis was performed it can show that there is a urinary tract infection. A urine culture confirms that you or your child is sick. You will need to follow up with a healthcare provider because:  If a urine culture was done you will need to know the culture results and treatment recommendations.  If the urine culture was positive, you or your child will need to be put on antibiotics or know if the antibiotics prescribed are the right antibiotics for your urinary tract infection.  If the urine culture is negative (no urinary tract infection), then other causes may need to be explored or antibiotics need to be stopped. Today laboratory work may have been done and there does not seem to be an infection. If cultures were done they will take at least 24 to 48 hours to be completed. Today x-rays may have been taken and they read as normal. No cause can be found for the problems. The x-rays may be re-read by a radiologist and you will be contacted if additional findings are made. You or your child may have been put on medications to help with this problem until you can see your primary caregiver. If the problems get better, see your primary caregiver if the problems return. If you were given antibiotics (medications which kill germs), take all of the mediations as directed for the full course of treatment.  If laboratory work was done, you need to find the results. Leave a telephone number where you can be reached. If this is not possible, make sure you find out how you are to get test results. HOME CARE  INSTRUCTIONS   Drink lots of fluids. For adults, drink eight, 8 ounce glasses of clear juice or water a day. For children, replace fluids as suggested by your caregiver.  Empty the bladder often. Avoid holding urine for long periods of time.  After a bowel movement, women should cleanse front to back, using each tissue only once.  Empty your bladder before and after sexual intercourse.  Take all the medicine given to you until it is gone. You may feel better in a few days, but TAKE ALL MEDICINE.  Avoid caffeine, tea, alcohol and carbonated beverages, because they tend to irritate the bladder.  In men, alcohol may irritate the prostate.  Only take over-the-counter or prescription medicines for pain, discomfort, or fever as directed by your caregiver.  If your caregiver has given you a follow-up appointment, it is very important to keep that appointment. Not keeping the appointment could result in a chronic or permanent injury, pain, and disability. If there is any problem keeping the appointment, you must call back to this facility for assistance. SEEK IMMEDIATE MEDICAL CARE IF:   Back pain develops.  A fever develops.  There is nausea (feeling sick to your stomach) or vomiting (throwing up).  Problems are no better with medications or are getting worse. MAKE SURE YOU:   Understand these instructions.  Will watch your condition.  Will get help right away if you are not doing well or get worse. Document Released: 06/01/2004 Document Revised: 11/26/2011  Document Reviewed: 04/08/2008 [facility] Patient Information 2014 [place].

## 2013-10-26 NOTE — ED Notes (Signed)
Pt c/o painful urination and urinary frequency. Pt reports h/o internal stimulator for bladder spasms and has to self cath.

## 2013-10-26 NOTE — ED Provider Notes (Signed)
CSN: [id number]     Arrival date & time 10/26/13  1454 History  This chart was scribed for Tanna Furry, MD by Celesta Gentile, ED Scribe. The patient was seen in room MH03/MH03. Patient's care was started at 3:26 PM.  Chief Complaint  Patient presents with  . Dysuria    The history is provided by the patient. No language interpreter was used.   HPI Comments: Tina Ramos is a 58 y.o. female who presents to the Emergency Department complaining of dysuria with associated urinary frequency that started about 3 days ago.  Pt states that she feels pressure in her bladder.  She reports that she has voided several times in the past couple of days.  Pt denies fever, chills, nausea, and emesis.  Pt has tried a heating pad without relief.  Pt reports she has a h/o internal bladder stimulator for bladder spasms and has to self cath.  Pt states that she is in full control of her stimulator.  Pt states that she normally caths in the morning and evening.  She states that her self cath is disposable.     Past Medical History  Diagnosis Date  . Migraine   . Diabetes mellitus without complication   . Gastroparesis   . Hyperlipemia   . Cirrhosis   . UTI (lower urinary tract infection)    Past Surgical History  Procedure Laterality Date  . Abdominal hysterectomy    . Appendectomy    . Abdominal surgery     No family history on file. History  Substance Use Topics  . Smoking status: Never Smoker   . Smokeless tobacco: Not on file  . Alcohol Use: No   OB History   Grav Para Term Preterm Abortions TAB SAB Ect Mult Living                 Review of Systems  Constitutional: Negative for fever, chills, diaphoresis, appetite change and fatigue.  HENT: Negative for mouth sores, sore throat and trouble swallowing.   Eyes: Negative for visual disturbance.  Respiratory: Negative for cough, chest tightness, shortness of breath and wheezing.   Cardiovascular: Negative for chest pain.  Gastrointestinal:  Negative for nausea, vomiting, abdominal pain, diarrhea and abdominal distention.  Endocrine: Negative for polydipsia, polyphagia and polyuria.  Genitourinary: Positive for dysuria and frequency. Negative for hematuria.  Musculoskeletal: Negative for gait problem.  Skin: Negative for color change, pallor and rash.  Neurological: Negative for dizziness, syncope, light-headedness and headaches.  Hematological: Does not bruise/bleed easily.  Psychiatric/Behavioral: Negative for behavioral problems and confusion.      Allergies  Darvon; Latex; Sulfa antibiotics; and Talwin  Home Medications   Current Outpatient Rx  Name  Route  Sig  Dispense  Refill  . buPROPion (WELLBUTRIN SR) 100 MG 12 hr tablet   Oral   Take 200 mg by mouth 2 (two) times daily.         Marland Kitchen buPROPion (WELLBUTRIN) 100 MG tablet   Oral   Take 100 mg by mouth 2 (two) times daily.         . clonazePAM (KLONOPIN) 0.5 MG tablet   Oral   Take 0.5 mg by mouth as directed. .5mg  tablet 1-2 tablets in am, 1 tablet in afternoon, then 1 tablet at bedtime         . ibuprofen (ADVIL,MOTRIN) 200 MG tablet   Oral   Take 200 mg by mouth every 6 (six) hours as needed.         Marland Kitchen  insulin glargine (LANTUS) 100 UNIT/ML injection   Subcutaneous   Inject into the skin at bedtime.         . lamoTRIgine (LAMICTAL) 100 MG tablet   Oral   Take 100 mg by mouth 2 (two) times daily.         . nitrofurantoin, macrocrystal-monohydrate, (MACROBID) 100 MG capsule   Oral   Take 1 capsule (100 mg total) by mouth 2 (two) times daily.   10 capsule   0   . phenazopyridine (PYRIDIUM) 200 MG tablet   Oral   Take 1 tablet (200 mg total) by mouth 3 (three) times daily.   6 tablet   0   . pravastatin (PRAVACHOL) 20 MG tablet   Oral   Take 20 mg by mouth daily.         . pregabalin (LYRICA) 150 MG capsule   Oral   Take 150 mg by mouth 2 (two) times daily.         . QUEtiapine Fumarate (SEROQUEL PO)   Oral   Take by  mouth.         . sitaGLIPtin-metformin (JANUMET) 50-1000 MG per tablet   Oral   Take 1 tablet by mouth 2 (two) times daily with a meal.          Triage Vitals: BP 134/58  Pulse 82  Temp(Src) 98 F (36.7 C) (Oral)  Resp 18  SpO2 100% Physical Exam  Nursing note and vitals reviewed. Constitutional: She appears well-developed and well-nourished. No distress.  HENT:  Head: Normocephalic and atraumatic.  Eyes: Conjunctivae and EOM are normal. Right eye exhibits no discharge. Left eye exhibits no discharge.  Neck: Neck supple.  Cardiovascular: Normal rate, regular rhythm and normal heart sounds.  Exam reveals no gallop and no friction rub.   No murmur heard. Pulmonary/Chest: Effort normal and breath sounds normal. No respiratory distress. She has no wheezes. She has no rales.  Abdominal: Soft. She exhibits no distension. There is no tenderness.  Musculoskeletal: She exhibits no edema and no tenderness.  Slight tenderness to suprapubic.    Neurological: She is alert.  Skin: Skin is warm and dry. No rash noted. No pallor.  Psychiatric: She has a normal mood and affect. Her behavior is normal. Judgment and thought content normal.    ED Course  Procedures (including critical care time) DIAGNOSTIC STUDIES: Oxygen Saturation is 100% on RA, normal by my interpretation.    COORDINATION OF CARE: 3:35 PM-Will order UA.  Patient informed of current plan of treatment and evaluation and agrees with plan.    Labs Review Labs Reviewed  URINALYSIS, ROUTINE W REFLEX MICROSCOPIC - Abnormal; Notable for the following:    APPearance CLOUDY (*)    All other components within normal limits   Imaging Review No results found.  MDM   Final diagnoses:  Dysuria   I personally performed the services described in this documentation, which was scribed in my presence. The recorded information has been reviewed and is accurate.   Urinalysis is essentially normal other than cloudy urine. She has  had a robotic sent home for the last 24 hours. I will place her on a minimum of 3 day course of Macrobid, and asked her to continue until her symptoms resolve.. She has a urologist appointment on Thursday. Asked her to recheck the culture results then.    Tanna Furry, MD 10/26/13 [phone]

## 2013-11-27 ENCOUNTER — Encounter (HOSPITAL_BASED_OUTPATIENT_CLINIC_OR_DEPARTMENT_OTHER): Payer: Self-pay | Admitting: Emergency Medicine

## 2013-11-27 ENCOUNTER — Emergency Department (HOSPITAL_BASED_OUTPATIENT_CLINIC_OR_DEPARTMENT_OTHER)
Admission: EM | Admit: 2013-11-27 | Discharge: 2013-11-27 | Disposition: A | Discharge status: Home / Self Care | Payer: Medicare HMO | Attending: Emergency Medicine | Admitting: Emergency Medicine

## 2013-11-27 DIAGNOSIS — E785 Hyperlipidemia, unspecified: Secondary | ICD-10-CM | POA: Insufficient documentation

## 2013-11-27 DIAGNOSIS — Z794 Long term (current) use of insulin: Secondary | ICD-10-CM | POA: Insufficient documentation

## 2013-11-27 DIAGNOSIS — Z79899 Other long term (current) drug therapy: Secondary | ICD-10-CM | POA: Insufficient documentation

## 2013-11-27 DIAGNOSIS — E119 Type 2 diabetes mellitus without complications: Secondary | ICD-10-CM | POA: Insufficient documentation

## 2013-11-27 DIAGNOSIS — G43909 Migraine, unspecified, not intractable, without status migrainosus: Secondary | ICD-10-CM | POA: Insufficient documentation

## 2013-11-27 DIAGNOSIS — Z87440 Personal history of urinary (tract) infections: Secondary | ICD-10-CM | POA: Insufficient documentation

## 2013-11-27 DIAGNOSIS — Z91040 Latex allergy status: Secondary | ICD-10-CM | POA: Insufficient documentation

## 2013-11-27 MED ORDER — DIPHENHYDRAMINE HCL 50 MG/ML IJ SOLN
25.0000 mg | Freq: Once | INTRAMUSCULAR | Status: AC
  Administered 2013-11-27: 25 mg via INTRAMUSCULAR
  Filled 2013-11-27: qty 1

## 2013-11-27 MED ORDER — DIAZEPAM 5 MG PO TABS
5.0000 mg | ORAL_TABLET | Freq: Once | ORAL | Status: AC
  Administered 2013-11-27: 5 mg via ORAL
  Filled 2013-11-27: qty 1

## 2013-11-27 MED ORDER — METOCLOPRAMIDE HCL 5 MG/ML IJ SOLN
10.0000 mg | Freq: Once | INTRAMUSCULAR | Status: AC
  Administered 2013-11-27: 10 mg via INTRAMUSCULAR
  Filled 2013-11-27: qty 2

## 2013-11-27 MED ORDER — KETOROLAC TROMETHAMINE 60 MG/2ML IM SOLN
60.0000 mg | Freq: Once | INTRAMUSCULAR | Status: AC
  Administered 2013-11-27: 60 mg via INTRAMUSCULAR
  Filled 2013-11-27: qty 2

## 2013-11-27 NOTE — ED Notes (Signed)
Migraine x 3 days. Light sensitive. Nauseated. Has taken Maxalt x 2 today and 2 yesterday.

## 2013-11-27 NOTE — Discharge Instructions (Signed)
Migraine Headache A migraine headache is an intense, throbbing pain on one or both sides of your head. A migraine can last for 30 minutes to several hours. CAUSES  The exact cause of a migraine headache is not always known. However, a migraine may be caused when nerves in the brain become irritated and release chemicals that cause inflammation. This causes pain. Certain things may also trigger migraines, such as:  Alcohol.  Smoking.  Stress.  Menstruation.  Aged cheeses.  Foods or drinks that contain nitrates, glutamate, aspartame, or tyramine.  Lack of sleep.  Chocolate.  Caffeine.  Hunger.  Physical exertion.  Fatigue.  Medicines used to treat chest pain (nitroglycerine), birth control pills, estrogen, and some blood pressure medicines. SIGNS AND SYMPTOMS  Pain on one or both sides of your head.  Pulsating or throbbing pain.  Severe pain that prevents daily activities.  Pain that is aggravated by any physical activity.  Nausea, vomiting, or both.  Dizziness.  Pain with exposure to bright lights, loud noises, or activity.  General sensitivity to bright lights, loud noises, or smells. Before you get a migraine, you may get warning signs that a migraine is coming (aura). An aura may include:  Seeing flashing lights.  Seeing bright spots, halos, or zig-zag lines.  Having tunnel vision or blurred vision.  Having feelings of numbness or tingling.  Having trouble talking.  Having muscle weakness. DIAGNOSIS  A migraine headache is often diagnosed based on:  Symptoms.  Physical exam.  A CT scan or MRI of your head. These imaging tests cannot diagnose migraines, but they can help rule out other causes of headaches. TREATMENT Medicines may be given for pain and nausea. Medicines can also be given to help prevent recurrent migraines.  HOME CARE INSTRUCTIONS  Only take over-the-counter or prescription medicines for pain or discomfort as directed by your  health care provider. The use of long-term narcotics is not recommended.  Lie down in a dark, quiet room when you have a migraine.  Keep a journal to find out what may trigger your migraine headaches. For example, write down:  What you eat and drink.  How much sleep you get.  Any change to your diet or medicines.  Limit alcohol consumption.  Quit smoking if you smoke.  Get 7 9 hours of sleep, or as recommended by your health care provider.  Limit stress.  Keep lights dim if bright lights bother you and make your migraines worse. SEEK IMMEDIATE MEDICAL CARE IF:   Your migraine becomes severe.  You have a fever.  You have a stiff neck.  You have vision loss.  You have muscular weakness or loss of muscle control.  You start losing your balance or have trouble walking.  You feel faint or pass out.  You have severe symptoms that are different from your first symptoms. MAKE SURE YOU:   Understand these instructions.  Will watch your condition.  Will get help right away if you are not doing well or get worse. Document Released: 09/03/2005 Document Revised: 06/24/2013 Document Reviewed: 05/11/2013 [employer] Patient Information 2014 [employer].  

## 2013-11-27 NOTE — ED Provider Notes (Signed)
Medical screening examination/treatment/procedure(s) were performed by non-physician practitioner and as supervising physician I was immediately available for consultation/collaboration.   EKG Interpretation None        Eunie Lawn B. Karle Starch, MD 11/27/13 2214

## 2013-11-27 NOTE — ED Provider Notes (Signed)
CSN: [id number]     Arrival date & time 11/27/13  1924 History   First MD Initiated Contact with Patient 11/27/13 2135     Chief Complaint  Patient presents with  . Migraine     (Consider location/radiation/quality/duration/timing/severity/associated sxs/prior Treatment) Patient is a 58 y.o. female presenting with migraines. The history is provided by the patient. No language interpreter was used.  Migraine This is a new problem. Episode onset: 3 days. The problem occurs constantly. The problem has been gradually worsening. Pertinent negatives include no fever or sore throat. Nothing aggravates the symptoms. She has tried nothing for the symptoms. The treatment provided no relief.  Pt reports no relief with maxalt.    Past Medical History  Diagnosis Date  . Migraine   . Diabetes mellitus without complication   . Gastroparesis   . Hyperlipemia   . Cirrhosis   . UTI (lower urinary tract infection)    Past Surgical History  Procedure Laterality Date  . Abdominal hysterectomy    . Appendectomy    . Abdominal surgery     No family history on file. History  Substance Use Topics  . Smoking status: Never Smoker   . Smokeless tobacco: Not on file  . Alcohol Use: No   OB History   Grav Para Term Preterm Abortions TAB SAB Ect Mult Living                 Review of Systems  Constitutional: Negative for fever.  HENT: Negative for sore throat.   All other systems reviewed and are negative.      Allergies  Darvon; Latex; Sulfa antibiotics; and Talwin  Home Medications   Current Outpatient Rx  Name  Route  Sig  Dispense  Refill  . amitriptyline (ELAVIL) 50 MG tablet   Oral   Take 50 mg by mouth at bedtime.         Marland Kitchen buPROPion (WELLBUTRIN SR) 100 MG 12 hr tablet   Oral   Take 200 mg by mouth 2 (two) times daily.         Marland Kitchen buPROPion (WELLBUTRIN) 100 MG tablet   Oral   Take 100 mg by mouth 2 (two) times daily.         . clonazePAM (KLONOPIN) 0.5 MG tablet  Oral   Take 0.5 mg by mouth as directed. .5mg  tablet 1-2 tablets in am, 1 tablet in afternoon, then 1 tablet at bedtime         . ibuprofen (ADVIL,MOTRIN) 200 MG tablet   Oral   Take 200 mg by mouth every 6 (six) hours as needed.         . insulin glargine (LANTUS) 100 UNIT/ML injection   Subcutaneous   Inject into the skin at bedtime.         . lamoTRIgine (LAMICTAL) 100 MG tablet   Oral   Take 100 mg by mouth 2 (two) times daily.         . nitrofurantoin, macrocrystal-monohydrate, (MACROBID) 100 MG capsule   Oral   Take 1 capsule (100 mg total) by mouth 2 (two) times daily.   10 capsule   0   . phenazopyridine (PYRIDIUM) 200 MG tablet   Oral   Take 1 tablet (200 mg total) by mouth 3 (three) times daily.   6 tablet   0   . pravastatin (PRAVACHOL) 20 MG tablet   Oral   Take 20 mg by mouth daily.         Marland Kitchen  pregabalin (LYRICA) 150 MG capsule   Oral   Take 150 mg by mouth 2 (two) times daily.         . QUEtiapine Fumarate (SEROQUEL PO)   Oral   Take by mouth.         . sitaGLIPtin-metformin (JANUMET) 50-1000 MG per tablet   Oral   Take 1 tablet by mouth 2 (two) times daily with a meal.          BP 147/73  Pulse 95  Temp(Src) 98.6 F (37 C) (Oral)  Resp 20  Ht 5\' 2"  (1.575 m)  Wt 144 lb (65.318 kg)  BMI 26.33 kg/m2  SpO2 97% Physical Exam  Constitutional: She is oriented to person, place, and time. She appears well-developed and well-nourished.  HENT:  Head: Normocephalic.  Right Ear: External ear normal.  Left Ear: External ear normal.  Mouth/Throat: Oropharynx is clear and moist.  Eyes: Conjunctivae are normal. Pupils are equal, round, and reactive to light.  Neck: Normal range of motion.  Cardiovascular: Normal rate and normal heart sounds.   Pulmonary/Chest: Effort normal.  Abdominal: Soft.  Musculoskeletal: Normal range of motion.  Neurological: She is alert and oriented to person, place, and time.  Skin: Skin is warm.  Psychiatric:  She has a normal mood and affect.    ED Course  Procedures (including critical care time) Labs Review Labs Reviewed - No data to display Imaging Review No results found.   EKG Interpretation None      MDM   Final diagnoses:  Migraine    Pt reports [facility] cocktail has worked well in the past.  Pt given torodol, reglan and benadryl    Fransico Meadow, [place] 11/27/13 2213

## 2014-01-23 ENCOUNTER — Encounter (HOSPITAL_BASED_OUTPATIENT_CLINIC_OR_DEPARTMENT_OTHER): Payer: Self-pay | Admitting: Emergency Medicine

## 2014-01-23 ENCOUNTER — Emergency Department (HOSPITAL_BASED_OUTPATIENT_CLINIC_OR_DEPARTMENT_OTHER)
Admission: EM | Admit: 2014-01-23 | Discharge: 2014-01-23 | Disposition: A | Discharge status: Home / Self Care | Payer: Medicare HMO | Attending: Emergency Medicine | Admitting: Emergency Medicine

## 2014-01-23 DIAGNOSIS — Z79899 Other long term (current) drug therapy: Secondary | ICD-10-CM | POA: Insufficient documentation

## 2014-01-23 DIAGNOSIS — J309 Allergic rhinitis, unspecified: Secondary | ICD-10-CM | POA: Insufficient documentation

## 2014-01-23 DIAGNOSIS — Z8719 Personal history of other diseases of the digestive system: Secondary | ICD-10-CM | POA: Insufficient documentation

## 2014-01-23 DIAGNOSIS — Z794 Long term (current) use of insulin: Secondary | ICD-10-CM | POA: Insufficient documentation

## 2014-01-23 DIAGNOSIS — E119 Type 2 diabetes mellitus without complications: Secondary | ICD-10-CM | POA: Insufficient documentation

## 2014-01-23 DIAGNOSIS — Z91040 Latex allergy status: Secondary | ICD-10-CM | POA: Insufficient documentation

## 2014-01-23 DIAGNOSIS — G43909 Migraine, unspecified, not intractable, without status migrainosus: Secondary | ICD-10-CM | POA: Insufficient documentation

## 2014-01-23 DIAGNOSIS — E785 Hyperlipidemia, unspecified: Secondary | ICD-10-CM | POA: Insufficient documentation

## 2014-01-23 DIAGNOSIS — Z87440 Personal history of urinary (tract) infections: Secondary | ICD-10-CM | POA: Insufficient documentation

## 2014-01-23 MED ORDER — FEXOFENADINE HCL 60 MG PO TABS: 60.0000 mg | ORAL_TABLET | Freq: Two times a day (BID) | ORAL | Status: DC

## 2014-01-23 NOTE — ED Notes (Signed)
Rx x 1 given for allegra-

## 2014-01-23 NOTE — ED Notes (Signed)
Patient here with frontal sinus pressure, headache and congestion increasing since Wednesday. Using mucinex and alka seltzer with minimal relief. Also reports that nasal drainage is now green and some mild ear pain

## 2014-01-23 NOTE — ED Provider Notes (Signed)
CSN: [id number]     Arrival date & time 01/23/14  1428 History   First MD Initiated Contact with Patient 01/23/14 1445     Chief Complaint  Patient presents with  . Nasal Congestion  . Headache     (Consider location/radiation/quality/duration/timing/severity/associated sxs/prior Treatment) HPI Comments: Patient presents with a four-day history of nasal congestion and sneezing. She states she's had a history of allergic rhinitis in the past and feels like her allergies are acting up. She has clear and yellow nasal discharge. She has some pressure in her eyes. She denies any fevers. She denies any nausea vomiting or dizziness. She's had some mild bifrontal-type headaches. She's been using Mucinex and Alka-Seltzer without relief.   Past Medical History  Diagnosis Date  . Migraine   . Diabetes mellitus without complication   . Gastroparesis   . Hyperlipemia   . Cirrhosis   . UTI (lower urinary tract infection)    Past Surgical History  Procedure Laterality Date  . Abdominal hysterectomy    . Appendectomy    . Abdominal surgery     No family history on file. History  Substance Use Topics  . Smoking status: Never Smoker   . Smokeless tobacco: Not on file  . Alcohol Use: No   OB History   Grav Para Term Preterm Abortions TAB SAB Ect Mult Living                 Review of Systems  Constitutional: Negative for fever, chills, diaphoresis and fatigue.  HENT: Positive for congestion, postnasal drip, rhinorrhea and sinus pressure. Negative for sneezing.   Eyes: Negative.   Respiratory: Negative for cough, chest tightness and shortness of breath.   Cardiovascular: Negative for chest pain and leg swelling.  Gastrointestinal: Negative for nausea, vomiting, abdominal pain, diarrhea and blood in stool.  Genitourinary: Negative for frequency, hematuria, flank pain and difficulty urinating.  Musculoskeletal: Negative for arthralgias and back pain.  Skin: Negative for rash.   Neurological: Negative for dizziness, speech difficulty, weakness, numbness and headaches.      Allergies  Darvon; Latex; Sulfa antibiotics; and Talwin  Home Medications   Prior to Admission medications   Medication Sig Start Date End Date Taking? Authorizing Provider  amitriptyline (ELAVIL) 50 MG tablet Take 50 mg by mouth at bedtime.    Historical Provider, MD  buPROPion (WELLBUTRIN SR) 100 MG 12 hr tablet Take 200 mg by mouth 2 (two) times daily.    Historical Provider, MD  buPROPion (WELLBUTRIN) 100 MG tablet Take 100 mg by mouth 2 (two) times daily.    Historical Provider, MD  clonazePAM (KLONOPIN) 0.5 MG tablet Take 0.5 mg by mouth as directed. .5mg  tablet 1-2 tablets in am, 1 tablet in afternoon, then 1 tablet at bedtime    Historical Provider, MD  fexofenadine (ALLEGRA) 60 MG tablet Take 1 tablet (60 mg total) by mouth 2 (two) times daily. 01/23/14   Malvin Johns, MD  ibuprofen (ADVIL,MOTRIN) 200 MG tablet Take 200 mg by mouth every 6 (six) hours as needed.    Historical Provider, MD  insulin glargine (LANTUS) 100 UNIT/ML injection Inject into the skin at bedtime.    Historical Provider, MD  lamoTRIgine (LAMICTAL) 100 MG tablet Take 100 mg by mouth 2 (two) times daily.    Historical Provider, MD  pravastatin (PRAVACHOL) 20 MG tablet Take 20 mg by mouth daily.    Historical Provider, MD  pregabalin (LYRICA) 150 MG capsule Take 150 mg by mouth 2 (two) times  daily.    Historical Provider, MD  QUEtiapine Fumarate (SEROQUEL PO) Take by mouth.    Historical Provider, MD  sitaGLIPtin-metformin (JANUMET) 50-1000 MG per tablet Take 1 tablet by mouth 2 (two) times daily with a meal.    Historical Provider, MD   BP 184/75  Pulse 96  Temp(Src) 98.5 F (36.9 C) (Oral)  Resp 18  Ht 5\' 2"  (1.575 m)  Wt 139 lb (63.05 kg)  BMI 25.42 kg/m2  SpO2 94% Physical Exam  Constitutional: She is oriented to person, place, and time. She appears well-developed and well-nourished.  HENT:  Head:  Normocephalic and atraumatic.  Right Ear: External ear normal.  Left Ear: External ear normal.  Mouth/Throat: Oropharynx is clear and moist.  Eyes: Pupils are equal, round, and reactive to light.  Neck: Normal range of motion. Neck supple.  Cardiovascular: Normal rate, regular rhythm and normal heart sounds.   Pulmonary/Chest: Effort normal and breath sounds normal. No respiratory distress. She has no wheezes. She has no rales. She exhibits no tenderness.  Abdominal: Soft. Bowel sounds are normal. There is no tenderness. There is no rebound and no guarding.  Musculoskeletal: Normal range of motion. She exhibits no edema.  Lymphadenopathy:    She has no cervical adenopathy.  Neurological: She is alert and oriented to person, place, and time.  Skin: Skin is warm and dry. No rash noted.  Psychiatric: She has a normal mood and affect.    ED Course  Procedures (including critical care time) Labs Review Labs Reviewed - No data to display  Imaging Review No results found.   EKG Interpretation None      MDM   Final diagnoses:  Allergic rhinitis    Pt presents with nasal congestion and sneezing for the last 3-4 days. She is nontoxic appearing give her prescription for Allegra-D use. She also has and Nasacort spray that I advised her to start. She has a full bottle at home. She will follow with her primary care physician if her symptoms are not improving.Malvin Johns, MD 01/23/14 [phone]

## 2014-01-23 NOTE — Discharge Instructions (Signed)
Allergic Rhinitis Allergic rhinitis is when the mucous membranes in the nose respond to allergens. Allergens are particles in the air that cause your body to have an allergic reaction. This causes you to release allergic antibodies. Through a chain of events, these eventually cause you to release histamine into the blood stream. Although meant to protect the body, it is this release of histamine that causes your discomfort, such as frequent sneezing, congestion, and an itchy, runny nose.  CAUSES  Seasonal allergic rhinitis (hay fever) is caused by pollen allergens that may come from grasses, trees, and weeds. Year-round allergic rhinitis (perennial allergic rhinitis) is caused by allergens such as house dust mites, pet dander, and mold spores.  SYMPTOMS   Nasal stuffiness (congestion).  Itchy, runny nose with sneezing and tearing of the eyes. DIAGNOSIS  Your health care provider can help you determine the allergen or allergens that trigger your symptoms. If you and your health care provider are unable to determine the allergen, skin or blood testing may be used. TREATMENT  Allergic Rhinitis does not have a cure, but it can be controlled by:  Medicines and allergy shots (immunotherapy).  Avoiding the allergen. Hay fever may often be treated with antihistamines in pill or nasal spray forms. Antihistamines block the effects of histamine. There are over-the-counter medicines that may help with nasal congestion and swelling around the eyes. Check with your health care provider before taking or giving this medicine.  If avoiding the allergen or the medicine prescribed do not work, there are many new medicines your health care provider can prescribe. Stronger medicine may be used if initial measures are ineffective. Desensitizing injections can be used if medicine and avoidance does not work. Desensitization is when a patient is given ongoing shots until the body becomes less sensitive to the allergen.  Make sure you follow up with your health care provider if problems continue. HOME CARE INSTRUCTIONS It is not possible to completely avoid allergens, but you can reduce your symptoms by taking steps to limit your exposure to them. It helps to know exactly what you are allergic to so that you can avoid your specific triggers. SEEK MEDICAL CARE IF:   You have a fever.  You develop a cough that does not stop easily (persistent).  You have shortness of breath.  You start wheezing.  Symptoms interfere with normal daily activities. Document Released: 05/29/2001 Document Revised: 06/24/2013 Document Reviewed: 05/11/2013 [employer] Patient Information 2014 [employer].  

## 2014-07-01 ENCOUNTER — Emergency Department (HOSPITAL_BASED_OUTPATIENT_CLINIC_OR_DEPARTMENT_OTHER)
Admission: EM | Admit: 2014-07-01 | Discharge: 2014-07-01 | Disposition: A | Discharge status: Home / Self Care | Payer: Medicare HMO | Attending: Emergency Medicine | Admitting: Emergency Medicine

## 2014-07-01 ENCOUNTER — Encounter (HOSPITAL_BASED_OUTPATIENT_CLINIC_OR_DEPARTMENT_OTHER): Payer: Self-pay | Admitting: Emergency Medicine

## 2014-07-01 DIAGNOSIS — E785 Hyperlipidemia, unspecified: Secondary | ICD-10-CM | POA: Diagnosis not present

## 2014-07-01 DIAGNOSIS — E119 Type 2 diabetes mellitus without complications: Secondary | ICD-10-CM | POA: Diagnosis not present

## 2014-07-01 DIAGNOSIS — Z8719 Personal history of other diseases of the digestive system: Secondary | ICD-10-CM | POA: Insufficient documentation

## 2014-07-01 DIAGNOSIS — G43909 Migraine, unspecified, not intractable, without status migrainosus: Secondary | ICD-10-CM | POA: Diagnosis present

## 2014-07-01 DIAGNOSIS — Z87440 Personal history of urinary (tract) infections: Secondary | ICD-10-CM | POA: Insufficient documentation

## 2014-07-01 DIAGNOSIS — Z79899 Other long term (current) drug therapy: Secondary | ICD-10-CM | POA: Diagnosis not present

## 2014-07-01 DIAGNOSIS — Z91040 Latex allergy status: Secondary | ICD-10-CM | POA: Insufficient documentation

## 2014-07-01 DIAGNOSIS — Z791 Long term (current) use of non-steroidal anti-inflammatories (NSAID): Secondary | ICD-10-CM | POA: Insufficient documentation

## 2014-07-01 DIAGNOSIS — G43709 Chronic migraine without aura, not intractable, without status migrainosus: Secondary | ICD-10-CM | POA: Diagnosis not present

## 2014-07-01 MED ORDER — DEXAMETHASONE SODIUM PHOSPHATE 10 MG/ML IJ SOLN
10.0000 mg | Freq: Once | INTRAMUSCULAR | Status: AC
  Administered 2014-07-01: 10 mg via INTRAVENOUS
  Filled 2014-07-01: qty 1

## 2014-07-01 MED ORDER — KETOROLAC TROMETHAMINE 30 MG/ML IJ SOLN
30.0000 mg | Freq: Once | INTRAMUSCULAR | Status: AC
  Administered 2014-07-01: 30 mg via INTRAVENOUS
  Filled 2014-07-01: qty 1

## 2014-07-01 MED ORDER — DIPHENHYDRAMINE HCL 50 MG/ML IJ SOLN
25.0000 mg | Freq: Once | INTRAMUSCULAR | Status: AC
  Administered 2014-07-01: 25 mg via INTRAVENOUS
  Filled 2014-07-01: qty 1

## 2014-07-01 MED ORDER — METOCLOPRAMIDE HCL 5 MG/ML IJ SOLN
10.0000 mg | Freq: Once | INTRAMUSCULAR | Status: AC
  Administered 2014-07-01: 10 mg via INTRAVENOUS
  Filled 2014-07-01: qty 2

## 2014-07-01 MED ORDER — HYDROCODONE-ACETAMINOPHEN 5-325 MG PO TABS: 1.0000 | ORAL_TABLET | Freq: Four times a day (QID) | ORAL | Status: DC | PRN

## 2014-07-01 MED ORDER — SODIUM CHLORIDE 0.9 % IV BOLUS (SEPSIS)
1000.0000 mL | Freq: Once | INTRAVENOUS | Status: AC
  Administered 2014-07-01: 1000 mL via INTRAVENOUS

## 2014-07-01 NOTE — ED Notes (Signed)
Pt has migraine for two days.  Pt has some nausea, no vomiting.  No fever.  Some light and sound sensitivity.  Pt states this is typical of her migraines.

## 2014-07-01 NOTE — Discharge Instructions (Signed)

## 2014-07-01 NOTE — ED Provider Notes (Signed)
CSN: [id number]     Arrival date & time 07/01/14  1331 History   First MD Initiated Contact with Patient 07/01/14 1345     Chief Complaint  Patient presents with  . Migraine     (Consider location/radiation/quality/duration/timing/severity/associated sxs/prior Treatment) Patient is a 58 y.o. female presenting with migraines. The history is provided by the patient.  Migraine This is a chronic problem. The current episode started more than 2 days ago. The problem occurs constantly. The problem has not changed since onset.Pertinent negatives include no chest pain, no abdominal pain and no shortness of breath. Exacerbated by: light, sound. Nothing relieves the symptoms.    Past Medical History  Diagnosis Date  . Migraine   . Diabetes mellitus without complication   . Gastroparesis   . Hyperlipemia   . Cirrhosis   . UTI (lower urinary tract infection)    Past Surgical History  Procedure Laterality Date  . Abdominal hysterectomy    . Appendectomy    . Abdominal surgery     No family history on file. History  Substance Use Topics  . Smoking status: Never Smoker   . Smokeless tobacco: Not on file  . Alcohol Use: No   OB History   Grav Para Term Preterm Abortions TAB SAB Ect Mult Living                 Review of Systems  Constitutional: Negative for fever.  Eyes: Positive for visual disturbance (mild blurry vision).  Respiratory: Negative for cough and shortness of breath.   Cardiovascular: Negative for chest pain.  Gastrointestinal: Positive for nausea. Negative for abdominal pain.  All other systems reviewed and are negative.     Allergies  Darvon; Latex; Sulfa antibiotics; and Talwin  Home Medications   Prior to Admission medications   Medication Sig Start Date End Date Taking? Authorizing Provider  amitriptyline (ELAVIL) 50 MG tablet Take 50 mg by mouth at bedtime.    Historical Provider, MD  buPROPion (WELLBUTRIN SR) 100 MG 12 hr tablet Take 200 mg by mouth 2  (two) times daily.    Historical Provider, MD  buPROPion (WELLBUTRIN) 100 MG tablet Take 100 mg by mouth 2 (two) times daily.    Historical Provider, MD  clonazePAM (KLONOPIN) 0.5 MG tablet Take 0.5 mg by mouth as directed. .5mg  tablet 1-2 tablets in am, 1 tablet in afternoon, then 1 tablet at bedtime    Historical Provider, MD  fexofenadine (ALLEGRA) 60 MG tablet Take 1 tablet (60 mg total) by mouth 2 (two) times daily. 01/23/14   Malvin Johns, MD  ibuprofen (ADVIL,MOTRIN) 200 MG tablet Take 200 mg by mouth every 6 (six) hours as needed.    Historical Provider, MD  insulin glargine (LANTUS) 100 UNIT/ML injection Inject into the skin at bedtime.    Historical Provider, MD  lamoTRIgine (LAMICTAL) 100 MG tablet Take 100 mg by mouth 2 (two) times daily.    Historical Provider, MD  pravastatin (PRAVACHOL) 20 MG tablet Take 20 mg by mouth daily.    Historical Provider, MD  pregabalin (LYRICA) 150 MG capsule Take 150 mg by mouth 2 (two) times daily.    Historical Provider, MD  QUEtiapine Fumarate (SEROQUEL PO) Take by mouth.    Historical Provider, MD  sitaGLIPtin-metformin (JANUMET) 50-1000 MG per tablet Take 1 tablet by mouth 2 (two) times daily with a meal.    Historical Provider, MD   BP 158/74  Pulse 106  Temp(Src) 98 F (36.7 C) (Oral)  Resp  14  Ht 5\' 2"  (1.575 m)  Wt 146 lb (66.225 kg)  BMI 26.70 kg/m2  SpO2 98% Physical Exam  Nursing note and vitals reviewed. Constitutional: She is oriented to person, place, and time. She appears well-developed and well-nourished. No distress.  HENT:  Head: Normocephalic and atraumatic.  Mouth/Throat: Oropharynx is clear and moist.  Eyes: EOM are normal. Pupils are equal, round, and reactive to light.  Neck: Normal range of motion. Neck supple.  Cardiovascular: Normal rate and regular rhythm.  Exam reveals no friction rub.   No murmur heard. Pulmonary/Chest: Effort normal and breath sounds normal. No respiratory distress. She has no wheezes. She has  no rales.  Abdominal: Soft. She exhibits no distension. There is no tenderness. There is no rebound.  Musculoskeletal: Normal range of motion. She exhibits no edema.  Neurological: She is alert and oriented to person, place, and time. No cranial nerve deficit. She exhibits normal muscle tone. Coordination normal.  Skin: No rash noted. She is not diaphoretic.    ED Course  Procedures (including critical care time) Labs Review Labs Reviewed - No data to display  Imaging Review No results found.   EKG Interpretation None      MDM   Final diagnoses:  Chronic migraine without aura without status migrainosus, not intractable    25F here with migraine. Hx of same. Here multiple times for migraine. Pain for last few days. No fevers. Associated mild blurry vision and nausea, similar to prior migraines. Sensitive to light and sound. Here AFVSS. Nonfocal neuro exam. Will give migraine cocktail. Feeling much better, ready for discharge. Easily ambulatory.  Evelina Bucy, MD 07/01/14 [phone]

## 2014-09-10 ENCOUNTER — Encounter (HOSPITAL_BASED_OUTPATIENT_CLINIC_OR_DEPARTMENT_OTHER): Payer: Self-pay | Admitting: *Deleted

## 2014-09-10 ENCOUNTER — Emergency Department (HOSPITAL_BASED_OUTPATIENT_CLINIC_OR_DEPARTMENT_OTHER)
Admission: EM | Admit: 2014-09-10 | Discharge: 2014-09-10 | Disposition: A | Discharge status: Home / Self Care | Payer: Medicare HMO | Attending: Emergency Medicine | Admitting: Emergency Medicine

## 2014-09-10 DIAGNOSIS — G43001 Migraine without aura, not intractable, with status migrainosus: Secondary | ICD-10-CM | POA: Insufficient documentation

## 2014-09-10 DIAGNOSIS — E785 Hyperlipidemia, unspecified: Secondary | ICD-10-CM | POA: Insufficient documentation

## 2014-09-10 DIAGNOSIS — Z91040 Latex allergy status: Secondary | ICD-10-CM | POA: Insufficient documentation

## 2014-09-10 DIAGNOSIS — E119 Type 2 diabetes mellitus without complications: Secondary | ICD-10-CM | POA: Insufficient documentation

## 2014-09-10 DIAGNOSIS — Z794 Long term (current) use of insulin: Secondary | ICD-10-CM | POA: Insufficient documentation

## 2014-09-10 DIAGNOSIS — Z8719 Personal history of other diseases of the digestive system: Secondary | ICD-10-CM | POA: Diagnosis not present

## 2014-09-10 DIAGNOSIS — G43909 Migraine, unspecified, not intractable, without status migrainosus: Secondary | ICD-10-CM | POA: Diagnosis present

## 2014-09-10 DIAGNOSIS — N390 Urinary tract infection, site not specified: Secondary | ICD-10-CM | POA: Insufficient documentation

## 2014-09-10 DIAGNOSIS — Z79899 Other long term (current) drug therapy: Secondary | ICD-10-CM | POA: Insufficient documentation

## 2014-09-10 HISTORY — DX: Interstitial cystitis (chronic) without hematuria: N30.10

## 2014-09-10 MED ORDER — KETOROLAC TROMETHAMINE 30 MG/ML IJ SOLN
30.0000 mg | Freq: Once | INTRAMUSCULAR | Status: AC
  Administered 2014-09-10: 30 mg via INTRAVENOUS
  Filled 2014-09-10: qty 1

## 2014-09-10 MED ORDER — SODIUM CHLORIDE 0.9 % IV BOLUS (SEPSIS)
1000.0000 mL | Freq: Once | INTRAVENOUS | Status: AC
  Administered 2014-09-10: 1000 mL via INTRAVENOUS

## 2014-09-10 MED ORDER — METOCLOPRAMIDE HCL 5 MG/ML IJ SOLN
10.0000 mg | Freq: Once | INTRAMUSCULAR | Status: AC
  Administered 2014-09-10: 10 mg via INTRAVENOUS
  Filled 2014-09-10: qty 2

## 2014-09-10 MED ORDER — DIPHENHYDRAMINE HCL 50 MG/ML IJ SOLN
25.0000 mg | Freq: Once | INTRAMUSCULAR | Status: AC
  Administered 2014-09-10: 25 mg via INTRAVENOUS
  Filled 2014-09-10: qty 1

## 2014-09-10 MED ORDER — DIAZEPAM 5 MG PO TABS
5.0000 mg | ORAL_TABLET | Freq: Once | ORAL | Status: AC
  Administered 2014-09-10: 5 mg via ORAL
  Filled 2014-09-10: qty 1

## 2014-09-10 MED ORDER — DEXAMETHASONE SODIUM PHOSPHATE 10 MG/ML IJ SOLN
10.0000 mg | Freq: Once | INTRAMUSCULAR | Status: AC
  Administered 2014-09-10: 10 mg via INTRAVENOUS
  Filled 2014-09-10: qty 1

## 2014-09-10 NOTE — Discharge Instructions (Signed)
Return to the ED with any concerns including weakness of arms or legs, changes in vision or speech, fainting, vomiting and not able to keep down liquids, decreased level of alertness/lethargy or any other alarming symptoms

## 2014-09-10 NOTE — ED Notes (Signed)
mha x 2 days- taking maxalt without relief- +nausea

## 2014-09-10 NOTE — ED Provider Notes (Signed)
CSN: [id number]     Arrival date & time 09/10/14  1349 History   First MD Initiated Contact with Patient 09/10/14 1507     Chief Complaint  Patient presents with  . Migraine     (Consider location/radiation/quality/duration/timing/severity/associated sxs/prior Treatment) HPI  Pt presents with c/o migraine headache, similar to her prior migraines.  Pain is primarily frontal and on the right side.  Throbbing and constant.  She has had some nausea and vomiting associated.  No fever/chills.  No neck stiffness.  Pt states headache has been present for 2 days, was gradual in onset and has been worsening. She has been taking maxalt without much relief of her symptoms.  There are no other associated systemic symptoms, there are no other alleviating or modifying factors.   Past Medical History  Diagnosis Date  . Migraine   . Diabetes mellitus without complication   . Gastroparesis   . Hyperlipemia   . Cirrhosis   . UTI (lower urinary tract infection)   . Interstitial cystitis    Past Surgical History  Procedure Laterality Date  . Abdominal hysterectomy    . Appendectomy    . Abdominal surgery     No family history on file. History  Substance Use Topics  . Smoking status: Never Smoker   . Smokeless tobacco: Not on file  . Alcohol Use: No   OB History    No data available     Review of Systems  ROS reviewed and all otherwise negative except for mentioned in HPI    Allergies  Darvon; Sulfa antibiotics; Talwin; and Latex  Home Medications   Prior to Admission medications   Medication Sig Start Date End Date Taking? Authorizing Provider  amitriptyline (ELAVIL) 50 MG tablet Take 50 mg by mouth at bedtime.   Yes Historical Provider, MD  buPROPion (WELLBUTRIN SR) 100 MG 12 hr tablet Take 200 mg by mouth 2 (two) times daily.   Yes Historical Provider, MD  buPROPion (WELLBUTRIN) 100 MG tablet Take 100 mg by mouth 2 (two) times daily.   Yes Historical Provider, MD  clonazePAM  (KLONOPIN) 0.5 MG tablet Take 0.5 mg by mouth as directed. .5mg  tablet 1-2 tablets in am, 1 tablet in afternoon, then 1 tablet at bedtime   Yes Historical Provider, MD  fexofenadine (ALLEGRA) 60 MG tablet Take 1 tablet (60 mg total) by mouth 2 (two) times daily. 01/23/14  Yes Malvin Johns, MD  lamoTRIgine (LAMICTAL) 100 MG tablet Take 100 mg by mouth 2 (two) times daily.   Yes Historical Provider, MD  pravastatin (PRAVACHOL) 20 MG tablet Take 20 mg by mouth daily.   Yes Historical Provider, MD  QUEtiapine Fumarate (SEROQUEL PO) Take by mouth.   Yes Historical Provider, MD  sitaGLIPtin-metformin (JANUMET) 50-1000 MG per tablet Take 1 tablet by mouth 2 (two) times daily with a meal.   Yes Historical Provider, MD  HYDROcodone-acetaminophen (NORCO/VICODIN) 5-325 MG per tablet Take 1 tablet by mouth every 6 (six) hours as needed for moderate pain. 07/01/14   Evelina Bucy, MD  ibuprofen (ADVIL,MOTRIN) 200 MG tablet Take 200 mg by mouth every 6 (six) hours as needed.    Historical Provider, MD  insulin glargine (LANTUS) 100 UNIT/ML injection Inject into the skin at bedtime.    Historical Provider, MD  pregabalin (LYRICA) 150 MG capsule Take 150 mg by mouth 2 (two) times daily.    Historical Provider, MD   BP 114/55 mmHg  Pulse 97  Temp(Src) 97.9 F (36.6 C) (Oral)  Resp 18  SpO2 98%  Vitals reviewed Physical Exam  Physical Examination: General appearance - alert, well appearing, and in no distress Mental status - alert, oriented to person, place, and time Eyes - pupils equal and reactive, extraocular eye movements intact Mouth - mucous membranes moist, pharynx normal without lesions Neck - supple, no significant adenopathy Chest - clear to auscultation, no wheezes, rales or rhonchi, symmetric air entry Heart - normal rate, regular rhythm, normal S1, S2, no murmurs, rubs, clicks or gallops Neurological - alert, oriented x 3, cranial nerves 2-12 tested and intact, strength 5/5 in extremities x 4,  sensation intact Extremities - peripheral pulses normal, no pedal edema, no clubbing or cyanosis Skin - normal coloration and turgor, no rashes  ED Course  Procedures (including critical care time)  5:30 PM pt feels complete resolution of her headache and she is requesting discharge.  Labs Review Labs Reviewed - No data to display  Imaging Review No results found.   EKG Interpretation None      MDM   Final diagnoses:  Migraine without aura and with status migrainosus, not intractable    Pt presenting with c/o headache similar to her prior migraines- pt feels improved after migraine cocktail and is requesting discharge.  Normal neuro exam.  Low suspicion for [facility], meningitis or other emergent intracranial process    Threasa Beards, MD 09/12/14 2227

## 2014-11-27 ENCOUNTER — Encounter (HOSPITAL_BASED_OUTPATIENT_CLINIC_OR_DEPARTMENT_OTHER): Payer: Self-pay

## 2014-11-27 ENCOUNTER — Emergency Department (HOSPITAL_BASED_OUTPATIENT_CLINIC_OR_DEPARTMENT_OTHER)
Admission: EM | Admit: 2014-11-27 | Discharge: 2014-11-27 | Disposition: A | Discharge status: Home / Self Care | Payer: Medicare Other | Attending: Emergency Medicine | Admitting: Emergency Medicine

## 2014-11-27 DIAGNOSIS — G43109 Migraine with aura, not intractable, without status migrainosus: Secondary | ICD-10-CM | POA: Insufficient documentation

## 2014-11-27 DIAGNOSIS — Z8719 Personal history of other diseases of the digestive system: Secondary | ICD-10-CM | POA: Diagnosis not present

## 2014-11-27 DIAGNOSIS — Z87440 Personal history of urinary (tract) infections: Secondary | ICD-10-CM | POA: Insufficient documentation

## 2014-11-27 DIAGNOSIS — Z87448 Personal history of other diseases of urinary system: Secondary | ICD-10-CM | POA: Diagnosis not present

## 2014-11-27 DIAGNOSIS — G43009 Migraine without aura, not intractable, without status migrainosus: Secondary | ICD-10-CM

## 2014-11-27 DIAGNOSIS — Z794 Long term (current) use of insulin: Secondary | ICD-10-CM | POA: Insufficient documentation

## 2014-11-27 DIAGNOSIS — G43909 Migraine, unspecified, not intractable, without status migrainosus: Secondary | ICD-10-CM | POA: Diagnosis present

## 2014-11-27 DIAGNOSIS — E785 Hyperlipidemia, unspecified: Secondary | ICD-10-CM | POA: Diagnosis not present

## 2014-11-27 DIAGNOSIS — E119 Type 2 diabetes mellitus without complications: Secondary | ICD-10-CM | POA: Insufficient documentation

## 2014-11-27 DIAGNOSIS — Z79899 Other long term (current) drug therapy: Secondary | ICD-10-CM | POA: Insufficient documentation

## 2014-11-27 DIAGNOSIS — Z91040 Latex allergy status: Secondary | ICD-10-CM | POA: Insufficient documentation

## 2014-11-27 MED ORDER — HYDROCODONE-ACETAMINOPHEN 5-325 MG PO TABS: 1.0000 | ORAL_TABLET | Freq: Four times a day (QID) | ORAL | Status: DC | PRN

## 2014-11-27 MED ORDER — KETOROLAC TROMETHAMINE 30 MG/ML IJ SOLN
30.0000 mg | Freq: Once | INTRAMUSCULAR | Status: AC
  Administered 2014-11-27: 30 mg via INTRAVENOUS
  Filled 2014-11-27: qty 1

## 2014-11-27 MED ORDER — SODIUM CHLORIDE 0.9 % IV BOLUS (SEPSIS)
1000.0000 mL | Freq: Once | INTRAVENOUS | Status: AC
  Administered 2014-11-27: 1000 mL via INTRAVENOUS

## 2014-11-27 MED ORDER — PROMETHAZINE HCL 25 MG PO TABS: 25.0000 mg | ORAL_TABLET | Freq: Four times a day (QID) | ORAL | Status: AC | PRN

## 2014-11-27 MED ORDER — IBUPROFEN 600 MG PO TABS: 600.0000 mg | ORAL_TABLET | Freq: Four times a day (QID) | ORAL | Status: DC | PRN

## 2014-11-27 MED ORDER — METOCLOPRAMIDE HCL 5 MG/ML IJ SOLN
10.0000 mg | Freq: Once | INTRAMUSCULAR | Status: AC
  Administered 2014-11-27: 10 mg via INTRAVENOUS
  Filled 2014-11-27: qty 2

## 2014-11-27 MED ORDER — DIPHENHYDRAMINE HCL 50 MG/ML IJ SOLN
25.0000 mg | Freq: Once | INTRAMUSCULAR | Status: AC
  Administered 2014-11-27: 25 mg via INTRAVENOUS
  Filled 2014-11-27: qty 1

## 2014-11-27 NOTE — Discharge Instructions (Signed)
Migraine Headache A migraine headache is an intense, throbbing pain on one or both sides of your head. A migraine can last for 30 minutes to several hours. CAUSES  The exact cause of a migraine headache is not always known. However, a migraine may be caused when nerves in the brain become irritated and release chemicals that cause inflammation. This causes pain. Certain things may also trigger migraines, such as:  Alcohol.  Smoking.  Stress.  Menstruation.  Aged cheeses.  Foods or drinks that contain nitrates, glutamate, aspartame, or tyramine.  Lack of sleep.  Chocolate.  Caffeine.  Hunger.  Physical exertion.  Fatigue.  Medicines used to treat chest pain (nitroglycerine), birth control pills, estrogen, and some blood pressure medicines. SIGNS AND SYMPTOMS  Pain on one or both sides of your head.  Pulsating or throbbing pain.  Severe pain that prevents daily activities.  Pain that is aggravated by any physical activity.  Nausea, vomiting, or both.  Dizziness.  Pain with exposure to bright lights, loud noises, or activity.  General sensitivity to bright lights, loud noises, or smells. Before you get a migraine, you may get warning signs that a migraine is coming (aura). An aura may include:  Seeing flashing lights.  Seeing bright spots, halos, or zigzag lines.  Having tunnel vision or blurred vision.  Having feelings of numbness or tingling.  Having trouble talking.  Having muscle weakness. DIAGNOSIS  A migraine headache is often diagnosed based on:  Symptoms.  Physical exam.  A CT scan or MRI of your head. These imaging tests cannot diagnose migraines, but they can help rule out other causes of headaches. TREATMENT Medicines may be given for pain and nausea. Medicines can also be given to help prevent recurrent migraines.  HOME CARE INSTRUCTIONS  Only take over-the-counter or prescription medicines for pain or discomfort as directed by your  health care provider. The use of long-term narcotics is not recommended.  Lie down in a dark, quiet room when you have a migraine.  Keep a journal to find out what may trigger your migraine headaches. For example, write down:  What you eat and drink.  How much sleep you get.  Any change to your diet or medicines.  Limit alcohol consumption.  Quit smoking if you smoke.  Get 7-9 hours of sleep, or as recommended by your health care provider.  Limit stress.  Keep lights dim if bright lights bother you and make your migraines worse. SEEK IMMEDIATE MEDICAL CARE IF:   Your migraine becomes severe.  You have a fever.  You have a stiff neck.  You have vision loss.  You have muscular weakness or loss of muscle control.  You start losing your balance or have trouble walking.  You feel faint or pass out.  You have severe symptoms that are different from your first symptoms. MAKE SURE YOU:   Understand these instructions.  Will watch your condition.  Will get help right away if you are not doing well or get worse. Document Released: 09/03/2005 Document Revised: 01/18/2014 Document Reviewed: 05/11/2013 [facility] Patient Information 2015 [place], [place]. This information is not intended to replace advice given to you by your health care provider. Make sure you discuss any questions you have with your health care provider.  Recurrent Migraine Headache A migraine headache is an intense, throbbing pain on one or both sides of your head. Recurrent migraines keep coming back. A migraine can last for 30 minutes to several hours. CAUSES  The exact cause  of a migraine headache is not always known. However, a migraine may be caused when nerves in the brain become irritated and release chemicals that cause inflammation. This causes pain. Certain things may also trigger migraines, such as:   Alcohol.  Smoking.  Stress.  Menstruation.  Aged cheeses.  Foods or drinks that contain  nitrates, glutamate, aspartame, or tyramine.  Lack of sleep.  Chocolate.  Caffeine.  Hunger.  Physical exertion.  Fatigue.  Medicines used to treat chest pain (nitroglycerine), birth control pills, estrogen, and some blood pressure medicines. SYMPTOMS   Pain on one or both sides of your head.  Pulsating or throbbing pain.  Severe pain that prevents daily activities.  Pain that is aggravated by any physical activity.  Nausea, vomiting, or both.  Dizziness.  Pain with exposure to bright lights, loud noises, or activity.  General sensitivity to bright lights, loud noises, or smells. Before you get a migraine, you may get warning signs that a migraine is coming (aura). An aura may include:  Seeing flashing lights.  Seeing bright spots, halos, or zigzag lines.  Having tunnel vision or blurred vision.  Having feelings of numbness or tingling.  Having trouble talking.  Having muscle weakness. DIAGNOSIS  A recurrent migraine headache is often diagnosed based on:  Symptoms.  Physical examination.  A CT scan or MRI of your head. These imaging tests cannot diagnose migraines but can help rule out other causes of headaches.  TREATMENT  Medicines may be given for pain and nausea. Medicines can also be given to help prevent recurrent migraines. HOME CARE INSTRUCTIONS  Only take over-the-counter or prescription medicines for pain or discomfort as directed by your health care provider. The use of long-term narcotics is not recommended.  Lie down in a dark, quiet room when you have a migraine.  Keep a journal to find out what may trigger your migraine headaches. For example, write down:  What you eat and drink.  How much sleep you get.  Any change to your diet or medicines.  Limit alcohol consumption.  Quit smoking if you smoke.  Get 7-9 hours of sleep, or as recommended by your health care provider.  Limit stress.  Keep lights dim if bright lights bother  you and make your migraines worse. SEEK MEDICAL CARE IF:   You do not get relief from the medicines given to you.  You have a recurrence of pain.  You have a fever. SEEK IMMEDIATE MEDICAL CARE IF:  Your migraine becomes severe.  You have a stiff neck.  You have loss of vision.  You have muscular weakness or loss of muscle control.  You start losing your balance or have trouble walking.  You feel faint or pass out.  You have severe symptoms that are different from your first symptoms. MAKE SURE YOU:   Understand these instructions.  Will watch your condition.  Will get help right away if you are not doing well or get worse. Document Released: 05/29/2001 Document Revised: 01/18/2014 Document Reviewed: 05/11/2013 [facility] Patient Information 2015 [place], [place]. This information is not intended to replace advice given to you by your health care provider. Make sure you discuss any questions you have with your health care provider.

## 2014-11-27 NOTE — ED Notes (Signed)
Patient here with 4 days of persistent headache and saw neurologist on Thursday and received injection of toradol with no relief. Nausea with same, denies trauma. History of migraines

## 2014-11-27 NOTE — ED Provider Notes (Signed)
CSN: [id number]     Arrival date & time 11/27/14  1026 History   First MD Initiated Contact with Patient 11/27/14 1134     Chief Complaint  Patient presents with  . Migraine     (Consider location/radiation/quality/duration/timing/severity/associated sxs/prior Treatment) HPI Comments: Tina Ramos is a 59 y.o. female who complains of migraine headache for 4 day(s). She has a well established history of recurrent migraines.  Description of pain: throbbing pain, bilateral in the frontal area. Associated symptoms: light sensitivity and nausea. Patient has already taken Maxzide for this headache without relief.  Current Facility-Administered Medications: sodium chloride 0.9 % bolus 1,000 mL, 1,000 mL, Intravenous, Once, Courvoisier Hamblen, MD, Last Rate: 1,000 mL/hr at 11/27/14 1155, 1,000 mL at 11/27/14 1155  Current Outpatient Prescriptions: amitriptyline (ELAVIL) 50 MG tablet, Take 50 mg by mouth at bedtime., Disp: , Rfl:  buPROPion (WELLBUTRIN SR) 100 MG 12 hr tablet, Take 200 mg by mouth 2 (two) times daily., Disp: , Rfl:  buPROPion (WELLBUTRIN) 100 MG tablet, Take 100 mg by mouth 2 (two) times daily., Disp: , Rfl:  clonazePAM (KLONOPIN) 0.5 MG tablet, Take 0.5 mg by mouth as directed. .5mg  tablet 1-2 tablets in am, 1 tablet in afternoon, then 1 tablet at bedtime, Disp: , Rfl:  fexofenadine (ALLEGRA) 60 MG tablet, Take 1 tablet (60 mg total) by mouth 2 (two) times daily., Disp: 30 tablet, Rfl: 0 HYDROcodone-acetaminophen (NORCO/VICODIN) 5-325 MG per tablet, Take 1 tablet by mouth every 6 (six) hours as needed., Disp: 6 tablet, Rfl: 0 ibuprofen (ADVIL,MOTRIN) 600 MG tablet, Take 1 tablet (600 mg total) by mouth every 6 (six) hours as needed., Disp: 30 tablet, Rfl: 0 insulin glargine (LANTUS) 100 UNIT/ML injection, Inject into the skin at bedtime., Disp: , Rfl:  lamoTRIgine (LAMICTAL) 100 MG tablet, Take 100 mg by mouth 2 (two) times daily., Disp: , Rfl:  pravastatin (PRAVACHOL) 20 MG  tablet, Take 20 mg by mouth daily., Disp: , Rfl:  pregabalin (LYRICA) 150 MG capsule, Take 150 mg by mouth 2 (two) times daily., Disp: , Rfl:  QUEtiapine Fumarate (SEROQUEL PO), Take by mouth., Disp: , Rfl:  sitaGLIPtin-metformin (JANUMET) 50-1000 MG per tablet, Take 1 tablet by mouth 2 (two) times daily with a meal., Disp: , Rfl:    There are no associated abnormal neurological symptoms such as TIA's, loss of balance, loss of vision or speech, numbness or weakness on review. Past neurological history: negative for stroke, MS, epilepsy, or brain tumor.    Patient is a 59 y.o. female presenting with migraines. The history is provided by the patient.  Migraine Associated symptoms include headaches. Pertinent negatives include no chest pain, no abdominal pain and no shortness of breath.    Past Medical History  Diagnosis Date  . Migraine   . Diabetes mellitus without complication   . Gastroparesis   . Hyperlipemia   . Cirrhosis   . UTI (lower urinary tract infection)   . Interstitial cystitis    Past Surgical History  Procedure Laterality Date  . Abdominal hysterectomy    . Appendectomy    . Abdominal surgery     No family history on file. History  Substance Use Topics  . Smoking status: Never Smoker   . Smokeless tobacco: Not on file  . Alcohol Use: No   OB History    No data available     Review of Systems  Constitutional: Negative for activity change.  Respiratory: Negative for shortness of breath.   Cardiovascular: Negative for  chest pain.  Gastrointestinal: Positive for nausea. Negative for vomiting and abdominal pain.  Genitourinary: Negative for dysuria.  Musculoskeletal: Negative for neck pain.  Neurological: Positive for headaches. Negative for dizziness, weakness and numbness.      Allergies  Darvon; Sulfa antibiotics; Talwin; and Latex  Home Medications   Prior to Admission medications   Medication Sig Start Date End Date Taking? Authorizing Provider   amitriptyline (ELAVIL) 50 MG tablet Take 50 mg by mouth at bedtime.    Historical Provider, MD  buPROPion (WELLBUTRIN SR) 100 MG 12 hr tablet Take 200 mg by mouth 2 (two) times daily.    Historical Provider, MD  buPROPion (WELLBUTRIN) 100 MG tablet Take 100 mg by mouth 2 (two) times daily.    Historical Provider, MD  clonazePAM (KLONOPIN) 0.5 MG tablet Take 0.5 mg by mouth as directed. .5mg  tablet 1-2 tablets in am, 1 tablet in afternoon, then 1 tablet at bedtime    Historical Provider, MD  fexofenadine (ALLEGRA) 60 MG tablet Take 1 tablet (60 mg total) by mouth 2 (two) times daily. 01/23/14   Malvin Johns, MD  HYDROcodone-acetaminophen (NORCO/VICODIN) 5-325 MG per tablet Take 1 tablet by mouth every 6 (six) hours as needed. 11/27/14   Varney Biles, MD  ibuprofen (ADVIL,MOTRIN) 600 MG tablet Take 1 tablet (600 mg total) by mouth every 6 (six) hours as needed. 11/27/14   Varney Biles, MD  insulin glargine (LANTUS) 100 UNIT/ML injection Inject into the skin at bedtime.    Historical Provider, MD  lamoTRIgine (LAMICTAL) 100 MG tablet Take 100 mg by mouth 2 (two) times daily.    Historical Provider, MD  pravastatin (PRAVACHOL) 20 MG tablet Take 20 mg by mouth daily.    Historical Provider, MD  pregabalin (LYRICA) 150 MG capsule Take 150 mg by mouth 2 (two) times daily.    Historical Provider, MD  QUEtiapine Fumarate (SEROQUEL PO) Take by mouth.    Historical Provider, MD  sitaGLIPtin-metformin (JANUMET) 50-1000 MG per tablet Take 1 tablet by mouth 2 (two) times daily with a meal.    Historical Provider, MD   BP 138/71 mmHg  Pulse 122  Temp(Src) 98.3 F (36.8 C) (Oral)  Resp 20  Ht 5\' 2"  (1.575 m)  Wt 134 lb (60.782 kg)  BMI 24.50 kg/m2  SpO2 98% Physical Exam  Constitutional: She is oriented to person, place, and time. She appears well-developed and well-nourished.  HENT:  Head: Normocephalic and atraumatic.  Eyes: Conjunctivae and EOM are normal.  Neck: Neck supple.  Pulmonary/Chest:  Effort normal. No respiratory distress.  Neurological: She is alert and oriented to person, place, and time. No cranial nerve deficit.  Skin: Skin is warm and dry.  Nursing note and vitals reviewed.   ED Course  Procedures (including critical care time) Labs Review Labs Reviewed - No data to display  Imaging Review No results found.   EKG Interpretation None      MDM   Final diagnoses:  Migraine without aura and without status migrainosus, not intractable    DDX includes: Primary headaches - including migrainous headaches, cluster headaches, tension headaches. ICH Carotid dissection Cavernous sinus thrombosis Meningitis Encephalitis Sinusitis Tumor Vascular headaches AV malformation Brain aneurysm Muscular headaches  A/P: Pt comes in with cc of headaches. No concerns for life threatening secondary headaches based on history and exam and there is hx of migraines with similar headaches. Pt given toradol, reglan and is better. Headache resolved. Will d.c. She will see her Neurologist next week.  Varney Biles, MD 11/27/14 1301

## 2015-01-05 ENCOUNTER — Emergency Department (HOSPITAL_BASED_OUTPATIENT_CLINIC_OR_DEPARTMENT_OTHER): Payer: Medicare Other

## 2015-01-05 ENCOUNTER — Encounter (HOSPITAL_BASED_OUTPATIENT_CLINIC_OR_DEPARTMENT_OTHER): Payer: Self-pay

## 2015-01-05 ENCOUNTER — Emergency Department (HOSPITAL_BASED_OUTPATIENT_CLINIC_OR_DEPARTMENT_OTHER)
Admission: EM | Admit: 2015-01-05 | Discharge: 2015-01-05 | Disposition: A | Discharge status: Home / Self Care | Payer: Medicare Other | Attending: Emergency Medicine | Admitting: Emergency Medicine

## 2015-01-05 DIAGNOSIS — Z91040 Latex allergy status: Secondary | ICD-10-CM | POA: Diagnosis not present

## 2015-01-05 DIAGNOSIS — E119 Type 2 diabetes mellitus without complications: Secondary | ICD-10-CM | POA: Insufficient documentation

## 2015-01-05 DIAGNOSIS — G43909 Migraine, unspecified, not intractable, without status migrainosus: Secondary | ICD-10-CM | POA: Insufficient documentation

## 2015-01-05 DIAGNOSIS — Z87440 Personal history of urinary (tract) infections: Secondary | ICD-10-CM | POA: Insufficient documentation

## 2015-01-05 DIAGNOSIS — Z87448 Personal history of other diseases of urinary system: Secondary | ICD-10-CM | POA: Diagnosis not present

## 2015-01-05 DIAGNOSIS — R112 Nausea with vomiting, unspecified: Secondary | ICD-10-CM | POA: Diagnosis not present

## 2015-01-05 DIAGNOSIS — E785 Hyperlipidemia, unspecified: Secondary | ICD-10-CM | POA: Diagnosis not present

## 2015-01-05 DIAGNOSIS — K5900 Constipation, unspecified: Secondary | ICD-10-CM | POA: Insufficient documentation

## 2015-01-05 DIAGNOSIS — Z79899 Other long term (current) drug therapy: Secondary | ICD-10-CM | POA: Insufficient documentation

## 2015-01-05 DIAGNOSIS — Z794 Long term (current) use of insulin: Secondary | ICD-10-CM | POA: Insufficient documentation

## 2015-01-05 DIAGNOSIS — R1012 Left upper quadrant pain: Secondary | ICD-10-CM | POA: Diagnosis present

## 2015-01-05 DIAGNOSIS — R1013 Epigastric pain: Secondary | ICD-10-CM

## 2015-01-05 LAB — URINALYSIS, ROUTINE W REFLEX MICROSCOPIC
Bilirubin Urine: NEGATIVE
GLUCOSE, UA: NEGATIVE mg/dL
HGB URINE DIPSTICK: NEGATIVE
Ketones, ur: NEGATIVE mg/dL
LEUKOCYTES UA: NEGATIVE
NITRITE: NEGATIVE
PROTEIN: NEGATIVE mg/dL
SPECIFIC GRAVITY, URINE: 1.015 (ref 1.005–1.030)
UROBILINOGEN UA: 0.2 mg/dL (ref 0.0–1.0)
pH: 6.5 (ref 5.0–8.0)

## 2015-01-05 LAB — CBC WITH DIFFERENTIAL/PLATELET
Basophils Absolute: 0 10*3/uL (ref 0.0–0.1)
Basophils Relative: 0 % (ref 0–1)
Eosinophils Absolute: 0.2 10*3/uL (ref 0.0–0.7)
Eosinophils Relative: 2 % (ref 0–5)
HCT: 43.5 % (ref 36.0–46.0)
Hemoglobin: 14.4 g/dL (ref 12.0–15.0)
Lymphocytes Relative: 23 % (ref 12–46)
Lymphs Abs: 1.7 10*3/uL (ref 0.7–4.0)
MCH: 31.2 pg (ref 26.0–34.0)
MCHC: 33.1 g/dL (ref 30.0–36.0)
MCV: 94.2 fL (ref 78.0–100.0)
Monocytes Absolute: 0.5 10*3/uL (ref 0.1–1.0)
Monocytes Relative: 7 % (ref 3–12)
Neutro Abs: 5.1 10*3/uL (ref 1.7–7.7)
Neutrophils Relative %: 68 % (ref 43–77)
Platelets: 137 10*3/uL — ABNORMAL LOW (ref 150–400)
RBC: 4.62 MIL/uL (ref 3.87–5.11)
RDW: 13.4 % (ref 11.5–15.5)
WBC: 7.4 10*3/uL (ref 4.0–10.5)

## 2015-01-05 LAB — TROPONIN I

## 2015-01-05 LAB — COMPREHENSIVE METABOLIC PANEL
ALT: 25 U/L (ref 0–35)
ANION GAP: 9 (ref 5–15)
AST: 33 U/L (ref 0–37)
Albumin: 4.3 g/dL (ref 3.5–5.2)
Alkaline Phosphatase: 62 U/L (ref 39–117)
BUN: 19 mg/dL (ref 6–23)
CHLORIDE: 110 mmol/L (ref 96–112)
CO2: 20 mmol/L (ref 19–32)
CREATININE: 1.13 mg/dL — AB (ref 0.50–1.10)
Calcium: 9.1 mg/dL (ref 8.4–10.5)
GFR calc Af Amer: 60 mL/min — ABNORMAL LOW (ref >90)
GFR calc non Af Amer: 52 mL/min — ABNORMAL LOW (ref >90)
GLUCOSE: 271 mg/dL — AB (ref 70–99)
Potassium: 5.2 mmol/L — ABNORMAL HIGH (ref 3.5–5.1)
Sodium: 139 mmol/L (ref 135–145)
Total Bilirubin: 0.4 mg/dL (ref 0.3–1.2)
Total Protein: 7.2 g/dL (ref 6.0–8.3)

## 2015-01-05 LAB — CBG MONITORING, ED: GLUCOSE-CAPILLARY: 252 mg/dL — AB (ref 70–99)

## 2015-01-05 LAB — LIPASE, BLOOD: Lipase: 38 U/L (ref 11–59)

## 2015-01-05 MED ORDER — PANTOPRAZOLE SODIUM 40 MG IV SOLR
40.0000 mg | Freq: Once | INTRAVENOUS | Status: AC
  Administered 2015-01-05: 40 mg via INTRAVENOUS
  Filled 2015-01-05: qty 40

## 2015-01-05 MED ORDER — GI COCKTAIL ~~LOC~~
30.0000 mL | Freq: Once | ORAL | Status: AC
  Administered 2015-01-05: 30 mL via ORAL
  Filled 2015-01-05: qty 30

## 2015-01-05 MED ORDER — SODIUM CHLORIDE 0.9 % IV BOLUS (SEPSIS)
500.0000 mL | Freq: Once | INTRAVENOUS | Status: AC
  Administered 2015-01-05: 500 mL via INTRAVENOUS

## 2015-01-05 MED ORDER — IOHEXOL 300 MG/ML  SOLN
50.0000 mL | Freq: Once | INTRAMUSCULAR | Status: AC | PRN
  Administered 2015-01-05: 50 mL via ORAL

## 2015-01-05 NOTE — Discharge Instructions (Signed)
Your potassium was slightly elevated today.  Drink plenty of fluids and have this rechecked by your Family Doctor in the next week.  Your CT scan showed an irregularity in your colon that needs to be rechecked by your Gastroenterologist.     Abdominal Pain, Women Abdominal (stomach, pelvic, or belly) pain can be caused by many things. It is important to tell your doctor:  The location of the pain.  Does it come and go or is it present all the time?  Are there things that start the pain (eating certain foods, exercise)?  Are there other symptoms associated with the pain (fever, nausea, vomiting, diarrhea)? All of this is helpful to know when trying to find the cause of the pain. CAUSES   Stomach: virus or bacteria infection, or ulcer.  Intestine: appendicitis (inflamed appendix), regional ileitis (Crohn's disease), ulcerative colitis (inflamed colon), irritable bowel syndrome, diverticulitis (inflamed diverticulum of the colon), or cancer of the stomach or intestine.  Gallbladder disease or stones in the gallbladder.  Kidney disease, kidney stones, or infection.  Pancreas infection or cancer.  Fibromyalgia (pain disorder).  Diseases of the female organs:  Uterus: fibroid (non-cancerous) tumors or infection.  Fallopian tubes: infection or tubal pregnancy.  Ovary: cysts or tumors.  Pelvic adhesions (scar tissue).  Endometriosis (uterus lining tissue growing in the pelvis and on the pelvic organs).  Pelvic congestion syndrome (female organs filling up with blood just before the menstrual period).  Pain with the menstrual period.  Pain with ovulation (producing an egg).  Pain with an IUD (intrauterine device, birth control) in the uterus.  Cancer of the female organs.  Functional pain (pain not caused by a disease, may improve without treatment).  Psychological pain.  Depression. DIAGNOSIS  Your doctor will decide the seriousness of your pain by doing an  examination.  Blood tests.  X-rays.  Ultrasound.  CT scan (computed tomography, special type of X-ray).  MRI (magnetic resonance imaging).  Cultures, for infection.  Barium enema (dye inserted in the large intestine, to better view it with X-rays).  Colonoscopy (looking in intestine with a lighted tube).  Laparoscopy (minor surgery, looking in abdomen with a lighted tube).  Major abdominal exploratory surgery (looking in abdomen with a large incision). TREATMENT  The treatment will depend on the cause of the pain.   Many cases can be observed and treated at home.  Over-the-counter medicines recommended by your caregiver.  Prescription medicine.  Antibiotics, for infection.  Birth control pills, for painful periods or for ovulation pain.  Hormone treatment, for endometriosis.  Nerve blocking injections.  Physical therapy.  Antidepressants.  Counseling with a psychologist or psychiatrist.  Minor or major surgery. HOME CARE INSTRUCTIONS   Do not take laxatives, unless directed by your caregiver.  Take over-the-counter pain medicine only if ordered by your caregiver. Do not take aspirin because it can cause an upset stomach or bleeding.  Try a clear liquid diet (broth or water) as ordered by your caregiver. Slowly move to a bland diet, as tolerated, if the pain is related to the stomach or intestine.  Have a thermometer and take your temperature several times a day, and record it.  Bed rest and sleep, if it helps the pain.  Avoid sexual intercourse, if it causes pain.  Avoid stressful situations.  Keep your follow-up appointments and tests, as your caregiver orders.  If the pain does not go away with medicine or surgery, you may try:  Acupuncture.  Relaxation exercises (yoga, meditation).  Group therapy.  Counseling. SEEK MEDICAL CARE IF:   You notice certain foods cause stomach pain.  Your home care treatment is not helping your pain.  You  need stronger pain medicine.  You want your IUD removed.  You feel faint or lightheaded.  You develop nausea and vomiting.  You develop a rash.  You are having side effects or an allergy to your medicine. SEEK IMMEDIATE MEDICAL CARE IF:   Your pain does not go away or gets worse.  You have a fever.  Your pain is felt only in portions of the abdomen. The right side could possibly be appendicitis. The left lower portion of the abdomen could be colitis or diverticulitis.  You are passing blood in your stools (bright red or black tarry stools, with or without vomiting).  You have blood in your urine.  You develop chills, with or without a fever.  You pass out. MAKE SURE YOU:   Understand these instructions.  Will watch your condition.  Will get help right away if you are not doing well or get worse. Document Released: 07/01/2007 Document Revised: 01/18/2014 Document Reviewed: 07/21/2009 [facility] Patient Information 2015 [place], [place]. This information is not intended to replace advice given to you by your health care provider. Make sure you discuss any questions you have with your health care provider.  Constipation Constipation is when a person has fewer than three bowel movements a week, has difficulty having a bowel movement, or has stools that are dry, hard, or larger than normal. As people grow older, constipation is more common. If you try to fix constipation with medicines that make you have a bowel movement (laxatives), the problem may get worse. Long-term laxative use may cause the muscles of the colon to become weak. A low-fiber diet, not taking in enough fluids, and taking certain medicines may make constipation worse.  CAUSES   Certain medicines, such as antidepressants, pain medicine, iron supplements, antacids, and water pills.   Certain diseases, such as diabetes, irritable bowel syndrome (IBS), thyroid disease, or depression.   Not drinking enough water.    Not eating enough fiber-rich foods.   Stress or travel.   Lack of physical activity or exercise.   Ignoring the urge to have a bowel movement.   Using laxatives too much.  SIGNS AND SYMPTOMS   Having fewer than three bowel movements a week.   Straining to have a bowel movement.   Having stools that are hard, dry, or larger than normal.   Feeling full or bloated.   Pain in the lower abdomen.   Not feeling relief after having a bowel movement.  DIAGNOSIS  Your health care provider will take a medical history and perform a physical exam. Further testing may be done for severe constipation. Some tests may include:  A barium enema X-ray to examine your rectum, colon, and, sometimes, your small intestine.   A sigmoidoscopy to examine your lower colon.   A colonoscopy to examine your entire colon. TREATMENT  Treatment will depend on the severity of your constipation and what is causing it. Some dietary treatments include drinking more fluids and eating more fiber-rich foods. Lifestyle treatments may include regular exercise. If these diet and lifestyle recommendations do not help, your health care provider may recommend taking over-the-counter laxative medicines to help you have bowel movements. Prescription medicines may be prescribed if over-the-counter medicines do not work.  HOME CARE INSTRUCTIONS   Eat foods that have a lot of fiber, such as  fruits, vegetables, whole grains, and beans.  Limit foods high in fat and processed sugars, such as french fries, hamburgers, cookies, candies, and soda.   A fiber supplement may be added to your diet if you cannot get enough fiber from foods.   Drink enough fluids to keep your urine clear or pale yellow.   Exercise regularly or as directed by your health care provider.   Go to the restroom when you have the urge to go. Do not hold it.   Only take over-the-counter or prescription medicines as directed by your  health care provider. Do not take other medicines for constipation without talking to your health care provider first.  [place] IF:   You have bright red blood in your stool.   Your constipation lasts for more than 4 days or gets worse.   You have abdominal or rectal pain.   You have thin, pencil-like stools.   You have unexplained weight loss. MAKE SURE YOU:   Understand these instructions.  Will watch your condition.  Will get help right away if you are not doing well or get worse. Document Released: 06/01/2004 Document Revised: 09/08/2013 Document Reviewed: 06/15/2013 [facility] Patient Information 2015 [place], [place]. This information is not intended to replace advice given to you by your health care provider. Make sure you discuss any questions you have with your health care provider.

## 2015-01-05 NOTE — ED Notes (Signed)
Pt reports hx of ulcers and started having pain last Saturday in epigastric region.  Reports been taking over the counter medication without relief.

## 2015-01-05 NOTE — ED Notes (Signed)
Pt alert, NAD, calm, interactive, resps e/u, speaking in clear complete sentences. Denies sob, nausea or dizziness, rates abd pain 8/10, "feel the same".

## 2015-01-05 NOTE — ED Provider Notes (Signed)
CSN: [id number]     Arrival date & time 01/05/15  1817 History  This chart was scribed for Quintella Reichert, MD by Irene Pap, ED Scribe. This patient was seen in room MH02/MH02 and patient care was started at 6:41 PM.       Chief Complaint  Patient presents with  . Abdominal Pain   Patient is a 59 y.o. female presenting with abdominal pain. The history is provided by the patient. No language interpreter was used.  Abdominal Pain Pain location:  LUQ Pain quality: bloating and burning   Pain radiates to:  Does not radiate Pain severity:  Moderate Onset quality:  Sudden Duration:  4 days Timing:  Constant Progression:  Worsening Chronicity:  New Associated symptoms: nausea and vomiting   Associated symptoms: no constipation    HPI Comments: Tina Ramos is a 59 y.o. female with a history of cirrhosis, gastroparesis and diabetes who presents to the Emergency Department complaining of constant, burning left upper abdominal pain onset 4 days ago. Patient reports associated polydipsia, nausea, vomiting, and feels swollen in the abdominal area. She states that she is unsure about her current sugar level as she has run out of strips and has not received anymore from her doctor. She states that her sugar was over 300 a few days ago, which she states is really high for her. She denies constipation, fever, or dysuria. She reports additional history of fatty liver and liver problems and a hysterectomy.   Past Medical History  Diagnosis Date  . Migraine   . Diabetes mellitus without complication   . Gastroparesis   . Hyperlipemia   . Cirrhosis   . UTI (lower urinary tract infection)   . Interstitial cystitis    Past Surgical History  Procedure Laterality Date  . Abdominal hysterectomy    . Appendectomy    . Abdominal surgery     No family history on file. History  Substance Use Topics  . Smoking status: Never Smoker   . Smokeless tobacco: Not on file  . Alcohol Use: No   OB  History    No data available     Review of Systems  Gastrointestinal: Positive for nausea, vomiting and abdominal pain. Negative for constipation.  Endocrine: Positive for polydipsia.  All other systems reviewed and are negative.  Allergies  Darvon; Sulfa antibiotics; Talwin; and Latex  Home Medications   Prior to Admission medications   Medication Sig Start Date End Date Taking? Authorizing Provider  amitriptyline (ELAVIL) 50 MG tablet Take 50 mg by mouth at bedtime.    Historical Provider, MD  buPROPion (WELLBUTRIN SR) 100 MG 12 hr tablet Take 200 mg by mouth 2 (two) times daily.    Historical Provider, MD  buPROPion (WELLBUTRIN) 100 MG tablet Take 100 mg by mouth 2 (two) times daily.    Historical Provider, MD  clonazePAM (KLONOPIN) 0.5 MG tablet Take 0.5 mg by mouth as directed. .5mg  tablet 1-2 tablets in am, 1 tablet in afternoon, then 1 tablet at bedtime    Historical Provider, MD  fexofenadine (ALLEGRA) 60 MG tablet Take 1 tablet (60 mg total) by mouth 2 (two) times daily. 01/23/14   Malvin Johns, MD  HYDROcodone-acetaminophen (NORCO/VICODIN) 5-325 MG per tablet Take 1 tablet by mouth every 6 (six) hours as needed. 11/27/14   Varney Biles, MD  ibuprofen (ADVIL,MOTRIN) 600 MG tablet Take 1 tablet (600 mg total) by mouth every 6 (six) hours as needed. 11/27/14   Varney Biles, MD  insulin  glargine (LANTUS) 100 UNIT/ML injection Inject into the skin at bedtime.    Historical Provider, MD  lamoTRIgine (LAMICTAL) 100 MG tablet Take 100 mg by mouth 2 (two) times daily.    Historical Provider, MD  pravastatin (PRAVACHOL) 20 MG tablet Take 20 mg by mouth daily.    Historical Provider, MD  pregabalin (LYRICA) 150 MG capsule Take 150 mg by mouth 2 (two) times daily.    Historical Provider, MD  promethazine (PHENERGAN) 25 MG tablet Take 1 tablet (25 mg total) by mouth every 6 (six) hours as needed for nausea. 11/27/14   Varney Biles, MD  QUEtiapine Fumarate (SEROQUEL PO) Take by mouth.     Historical Provider, MD  sitaGLIPtin-metformin (JANUMET) 50-1000 MG per tablet Take 1 tablet by mouth 2 (two) times daily with a meal.    Historical Provider, MD   BP 124/62 mmHg  Pulse 108  Temp(Src) 98.8 F (37.1 C) (Oral)  Resp 18  Ht 5\' 2"  (1.575 m)  Wt 134 lb (60.782 kg)  BMI 24.50 kg/m2  SpO2 99%   Physical Exam  Constitutional: She is oriented to person, place, and time. She appears well-developed and well-nourished.  HENT:  Head: Normocephalic and atraumatic.  Cardiovascular: Normal rate and regular rhythm.   No murmur heard. Pulmonary/Chest: Effort normal and breath sounds normal. No respiratory distress.  Abdominal: Soft. There is tenderness. There is no rebound and no guarding.  Mild diffuse tenderness  Musculoskeletal: She exhibits no edema or tenderness.  Neurological: She is alert and oriented to person, place, and time.  Skin: Skin is warm and dry.  Psychiatric: She has a normal mood and affect. Her behavior is normal.  Nursing note and vitals reviewed.   ED Course  Procedures (including critical care time) DIAGNOSTIC STUDIES: Oxygen Saturation is 99% on ropom air,normal  by my interpretation.    COORDINATION OF CARE: 6:45 PM-Discussed treatment plan which includes labs and EKG with pt at bedside and pt agreed to plan.   Labs Review Labs Reviewed  COMPREHENSIVE METABOLIC PANEL - Abnormal; Notable for the following:    Potassium 5.2 (*)    Glucose, Bld 271 (*)    Creatinine, Ser 1.13 (*)    GFR calc non Af Amer 52 (*)    GFR calc Af Amer 60 (*)    All other components within normal limits  CBC WITH DIFFERENTIAL/PLATELET - Abnormal; Notable for the following:    Platelets 137 (*)    All other components within normal limits  CBG MONITORING, ED - Abnormal; Notable for the following:    Glucose-Capillary 252 (*)    All other components within normal limits  URINALYSIS, ROUTINE W REFLEX MICROSCOPIC  TROPONIN I  LIPASE, BLOOD   Imaging Review Ct  Abdomen Pelvis Wo Contrast  01/05/2015   CLINICAL DATA:  59 year old female with 4 day history of left upper quadrant abdominal pain, nausea, vomiting, constipation.  EXAM: CT ABDOMEN AND PELVIS WITHOUT CONTRAST  TECHNIQUE: Multidetector CT imaging of the abdomen and pelvis was performed following the standard protocol without IV contrast.  COMPARISON:  Prior CT abdomen/pelvis 06/11/2012  FINDINGS: Lower Chest: The lung bases are clear. Visualized cardiac structures are within normal limits for size. No pericardial effusion. Unremarkable visualized distal thoracic esophagus.  Abdomen: Unremarkable CT appearance of the stomach, duodenum, spleen, adrenal glands and pancreas save for fatty atrophy in the head and neck. Diffusely nodular hepatic contour with cirrhotic morphology. No discrete hepatic lesion identified although evaluation is limited in the absence  of intravenous contrast. Gallbladder is unremarkable. No intra or extrahepatic biliary ductal dilatation.  Unremarkable appearance of the bilateral kidneys. No focal solid lesion, hydronephrosis or nephrolithiasis. The left colon is nearly completely decompressed. There is a moderate volume of formed stool throughout the transverse colon and the cecum is distended with fecal contents. The transition point from stool containing transverse colon to decompress left colon is noted in the left upper quadrant just proximal to the splenic flexure (image 44 series 2. There is a suggestion of focal wall thickening at this location. No evidence of surrounding inflammation or lymphadenopathy.  No free fluid or suspicious adenopathy.  Pelvis: Surgical changes of prior hysterectomy. Bladder is unremarkable. No free fluid or suspicious adenopathy.  Bones/Soft Tissues: Sacral stimulator. Multilevel degenerative disc disease and right worse than left lower lumbar facet arthropathy.  Vascular: Limited evaluation in the absence of intravenous contrast. No aneurysmal dilatation  or significant atherosclerotic calcification.  IMPRESSION: 1. No acute abnormality in the abdomen or pelvis. 2. Large colonic stool burden in the cecum, ascending and transverse colon with an abrupt tapered through decompressed colon in the left upper quadrant. There is a suggestion of mild focal thickening in this region of the colon without surrounding inflammatory change or evidence of lymphadenopathy. Focal contraction is possible however, a partially obstructing circumferential colonic mass is difficult to exclude radiographically. Consider further evaluation with colonoscopy. 3. Hepatic cirrhosis. 4. Multilevel degenerative disc disease and facet arthropathy.   Electronically Signed   By: Jacqulynn Cadet M.D.   On: 01/05/2015 22:46     EKG Interpretation   Date/Time:  Wednesday January 05 2015 19:02:56 EDT Ventricular Rate:  101 PR Interval:  150 QRS Duration: 76 QT Interval:  362 QTC Calculation: 469 R Axis:   74 Text Interpretation:  Sinus tachycardia Otherwise normal ECG Confirmed by  Hazle Coca [phone]) on 01/05/2015 7:17:33 PM      MDM   Final diagnoses:  Epigastric pain  Constipation, unspecified constipation type   Patient here for evaluation of abdominal pain. Labs demonstrate stable renal insufficiency with minimal hyperkalemia, no EKG changes. CT abdomen demonstrates large colonic stool burden and some circumferential thickening which may be causing partial obstruction. Patient without any vomiting in the department, tolerating oral fluids. Discussed with patient findings of CT scan and recommendation for treatment of constipation. Patient does have lactulose at home and will start this when she gets home. Discussed PCP as well as gastroenterology follow-up and return precautions.  I personally performed the services described in this documentation, which was scribed in my presence. The recorded information has been reviewed and is accurate.    Quintella Reichert, MD 01/06/15  [phone]

## 2015-03-17 ENCOUNTER — Emergency Department (HOSPITAL_BASED_OUTPATIENT_CLINIC_OR_DEPARTMENT_OTHER)
Admission: EM | Admit: 2015-03-17 | Discharge: 2015-03-17 | Disposition: A | Discharge status: Home / Self Care | Payer: Medicare Other | Attending: Emergency Medicine | Admitting: Emergency Medicine

## 2015-03-17 ENCOUNTER — Encounter (HOSPITAL_BASED_OUTPATIENT_CLINIC_OR_DEPARTMENT_OTHER): Payer: Self-pay | Admitting: *Deleted

## 2015-03-17 DIAGNOSIS — Z79899 Other long term (current) drug therapy: Secondary | ICD-10-CM | POA: Diagnosis not present

## 2015-03-17 DIAGNOSIS — G43909 Migraine, unspecified, not intractable, without status migrainosus: Secondary | ICD-10-CM | POA: Diagnosis not present

## 2015-03-17 DIAGNOSIS — Y9289 Other specified places as the place of occurrence of the external cause: Secondary | ICD-10-CM | POA: Insufficient documentation

## 2015-03-17 DIAGNOSIS — Z87448 Personal history of other diseases of urinary system: Secondary | ICD-10-CM | POA: Insufficient documentation

## 2015-03-17 DIAGNOSIS — R110 Nausea: Secondary | ICD-10-CM | POA: Insufficient documentation

## 2015-03-17 DIAGNOSIS — T07XXXA Unspecified multiple injuries, initial encounter: Secondary | ICD-10-CM

## 2015-03-17 DIAGNOSIS — W541XXA Struck by dog, initial encounter: Secondary | ICD-10-CM | POA: Diagnosis not present

## 2015-03-17 DIAGNOSIS — Z8719 Personal history of other diseases of the digestive system: Secondary | ICD-10-CM | POA: Insufficient documentation

## 2015-03-17 DIAGNOSIS — X58XXXA Exposure to other specified factors, initial encounter: Secondary | ICD-10-CM | POA: Diagnosis not present

## 2015-03-17 DIAGNOSIS — E119 Type 2 diabetes mellitus without complications: Secondary | ICD-10-CM | POA: Insufficient documentation

## 2015-03-17 DIAGNOSIS — M542 Cervicalgia: Secondary | ICD-10-CM | POA: Insufficient documentation

## 2015-03-17 DIAGNOSIS — Y998 Other external cause status: Secondary | ICD-10-CM | POA: Insufficient documentation

## 2015-03-17 DIAGNOSIS — E785 Hyperlipidemia, unspecified: Secondary | ICD-10-CM | POA: Insufficient documentation

## 2015-03-17 DIAGNOSIS — Y9389 Activity, other specified: Secondary | ICD-10-CM | POA: Diagnosis not present

## 2015-03-17 DIAGNOSIS — Z794 Long term (current) use of insulin: Secondary | ICD-10-CM | POA: Insufficient documentation

## 2015-03-17 DIAGNOSIS — Z87440 Personal history of urinary (tract) infections: Secondary | ICD-10-CM | POA: Diagnosis not present

## 2015-03-17 DIAGNOSIS — S50812A Abrasion of left forearm, initial encounter: Secondary | ICD-10-CM | POA: Insufficient documentation

## 2015-03-17 DIAGNOSIS — Z91040 Latex allergy status: Secondary | ICD-10-CM | POA: Insufficient documentation

## 2015-03-17 MED ORDER — METOCLOPRAMIDE HCL 5 MG/ML IJ SOLN
10.0000 mg | Freq: Once | INTRAMUSCULAR | Status: AC
  Administered 2015-03-17: 10 mg via INTRAVENOUS
  Filled 2015-03-17: qty 2

## 2015-03-17 MED ORDER — SODIUM CHLORIDE 0.9 % IV BOLUS (SEPSIS)
1000.0000 mL | Freq: Once | INTRAVENOUS | Status: AC
  Administered 2015-03-17: 1000 mL via INTRAVENOUS

## 2015-03-17 MED ORDER — MUPIROCIN CALCIUM 2 % EX CREA: 1.0000 "application " | TOPICAL_CREAM | Freq: Two times a day (BID) | CUTANEOUS | Status: AC

## 2015-03-17 MED ORDER — DIPHENHYDRAMINE HCL 50 MG/ML IJ SOLN
25.0000 mg | Freq: Once | INTRAMUSCULAR | Status: AC
  Administered 2015-03-17: 25 mg via INTRAVENOUS
  Filled 2015-03-17: qty 1

## 2015-03-17 MED ORDER — KETOROLAC TROMETHAMINE 30 MG/ML IJ SOLN
30.0000 mg | Freq: Once | INTRAMUSCULAR | Status: AC
Start: 2015-03-17 — End: 2015-03-17
  Administered 2015-03-17: 30 mg via INTRAVENOUS
  Filled 2015-03-17: qty 1

## 2015-03-17 NOTE — ED Notes (Signed)
Headache x 3 days. Nausea. States she has been taking OTC meds and generic Maxalt with no relief.

## 2015-03-17 NOTE — ED Provider Notes (Signed)
CSN: [id number]     Arrival date & time 03/17/15  1349 History   First MD Initiated Contact with Patient 03/17/15 1357     Chief Complaint  Patient presents with  . Migraine     (Consider location/radiation/quality/duration/timing/severity/associated sxs/prior Treatment) HPI Comments: Patient presents with a migraine. She has a history of migraines. She's had a gradually worsening headache over last 3 days. She's been taking her usual medications as well as Maxalt with no improvement of symptoms. She has pain to the back of her head and neck as well as radiation to her frontal region. She has some photophobia. She has nausea but no vomiting. She states it is same symptoms that she always has with her migraines. She denies any unusual type discomfort. There is no fevers or chills. She denies any recent head trauma. She denies any numbness or weakness in her extremities.  Patient is a 59 y.o. female presenting with migraines.  Migraine Associated symptoms include headaches. Pertinent negatives include no chest pain, no abdominal pain and no shortness of breath.    Past Medical History  Diagnosis Date  . Migraine   . Diabetes mellitus without complication   . Gastroparesis   . Hyperlipemia   . Cirrhosis   . UTI (lower urinary tract infection)   . Interstitial cystitis    Past Surgical History  Procedure Laterality Date  . Abdominal hysterectomy    . Appendectomy    . Abdominal surgery     No family history on file. History  Substance Use Topics  . Smoking status: Never Smoker   . Smokeless tobacco: Not on file  . Alcohol Use: No   OB History    No data available     Review of Systems  Constitutional: Negative for fever, chills, diaphoresis and fatigue.  HENT: Negative for congestion, rhinorrhea and sneezing.   Eyes: Positive for photophobia.  Respiratory: Negative for cough, chest tightness and shortness of breath.   Cardiovascular: Negative for chest pain and leg  swelling.  Gastrointestinal: Positive for nausea. Negative for vomiting, abdominal pain, diarrhea and blood in stool.  Genitourinary: Negative for frequency, hematuria, flank pain and difficulty urinating.  Musculoskeletal: Positive for neck pain. Negative for back pain and arthralgias.  Skin: Negative for rash.  Neurological: Positive for headaches. Negative for dizziness, speech difficulty, weakness and numbness.      Allergies  Darvon; Sulfa antibiotics; Talwin; and Latex  Home Medications   Prior to Admission medications   Medication Sig Start Date End Date Taking? Authorizing Provider  amitriptyline (ELAVIL) 50 MG tablet Take 50 mg by mouth at bedtime.    Historical Provider, MD  buPROPion (WELLBUTRIN SR) 100 MG 12 hr tablet Take 200 mg by mouth 2 (two) times daily.    Historical Provider, MD  buPROPion (WELLBUTRIN) 100 MG tablet Take 100 mg by mouth 2 (two) times daily.    Historical Provider, MD  clonazePAM (KLONOPIN) 0.5 MG tablet Take 0.5 mg by mouth as directed. .5mg  tablet 1-2 tablets in am, 1 tablet in afternoon, then 1 tablet at bedtime    Historical Provider, MD  fexofenadine (ALLEGRA) 60 MG tablet Take 1 tablet (60 mg total) by mouth 2 (two) times daily. 01/23/14   Malvin Johns, MD  HYDROcodone-acetaminophen (NORCO/VICODIN) 5-325 MG per tablet Take 1 tablet by mouth every 6 (six) hours as needed. 11/27/14   Varney Biles, MD  ibuprofen (ADVIL,MOTRIN) 600 MG tablet Take 1 tablet (600 mg total) by mouth every 6 (six) hours as  needed. 11/27/14   Varney Biles, MD  insulin glargine (LANTUS) 100 UNIT/ML injection Inject into the skin at bedtime.    Historical Provider, MD  lamoTRIgine (LAMICTAL) 100 MG tablet Take 100 mg by mouth 2 (two) times daily.    Historical Provider, MD  mupirocin cream (BACTROBAN) 2 % Apply 1 application topically 2 (two) times daily. For 7 days 03/17/15   Malvin Johns, MD  pravastatin (PRAVACHOL) 20 MG tablet Take 20 mg by mouth daily.    Historical  Provider, MD  pregabalin (LYRICA) 150 MG capsule Take 150 mg by mouth 2 (two) times daily.    Historical Provider, MD  promethazine (PHENERGAN) 25 MG tablet Take 1 tablet (25 mg total) by mouth every 6 (six) hours as needed for nausea. 11/27/14   Varney Biles, MD  QUEtiapine Fumarate (SEROQUEL PO) Take by mouth.    Historical Provider, MD  sitaGLIPtin-metformin (JANUMET) 50-1000 MG per tablet Take 1 tablet by mouth 2 (two) times daily with a meal.    Historical Provider, MD   BP 125/69 mmHg  Pulse 101  Temp(Src) 98.4 F (36.9 C) (Oral)  Resp 18  Ht 5\' 2"  (1.575 m)  Wt 134 lb (60.782 kg)  BMI 24.50 kg/m2  SpO2 97% Physical Exam  Constitutional: She is oriented to person, place, and time. She appears well-developed and well-nourished.  HENT:  Head: Normocephalic and atraumatic.  Eyes: Pupils are equal, round, and reactive to light.  Fundi not well visualized  Neck: Normal range of motion. Neck supple.  No meningismus  Cardiovascular: Normal rate, regular rhythm and normal heart sounds.   Pulmonary/Chest: Effort normal and breath sounds normal. No respiratory distress. She has no wheezes. She has no rales. She exhibits no tenderness.  Abdominal: Soft. Bowel sounds are normal. There is no tenderness. There is no rebound and no guarding.  Musculoskeletal: Normal range of motion. She exhibits no edema.  Lymphadenopathy:    She has no cervical adenopathy.  Neurological: She is alert and oriented to person, place, and time. She has normal strength. No cranial nerve deficit or sensory deficit. GCS eye subscore is 4. GCS verbal subscore is 5. GCS motor subscore is 6.  Finger to nose intact, no pronator drift  Skin: Skin is warm and dry. No rash noted.  Psychiatric: She has a normal mood and affect.    ED Course  Procedures (including critical care time) Labs Review Labs Reviewed - No data to display  Imaging Review No results found.   EKG Interpretation None      MDM   Final  diagnoses:  Migraine without status migrainosus, not intractable, unspecified migraine type  Abrasions of multiple sites    Patient presents with a migraine. Her symptoms are consistent with her past migraines type headaches. There is no unusual symptoms that would be more suggestive of subarachnoid hemorrhage or meningitis. She's neurologically intact. She does have multiple abrasions to her left forearm. They're scabbed over but there some mild redness around some of the abrasions. She states her dog scratched her. They were not bites. She's concerned because she is a diabetic and is worried about them getting infected area I don't see any overt signs of infection. There is no drainage. I will give her prescription for Bactroban ointment to use on the areas.    Malvin Johns, MD 03/17/15 [phone]

## 2015-03-17 NOTE — Discharge Instructions (Signed)
Abrasion An abrasion is a cut or scrape of the skin. Abrasions do not extend through all layers of the skin and most heal within 10 days. It is important to care for your abrasion properly to prevent infection. CAUSES  Most abrasions are caused by falling on, or gliding across, the ground or other surface. When your skin rubs on something, the outer and inner layer of skin rubs off, causing an abrasion. DIAGNOSIS  Your caregiver will be able to diagnose an abrasion during a physical exam.  TREATMENT  Your treatment depends on how large and deep the abrasion is. Generally, your abrasion will be cleaned with water and a mild soap to remove any dirt or debris. An antibiotic ointment may be put over the abrasion to prevent an infection. A bandage (dressing) may be wrapped around the abrasion to keep it from getting dirty.  You may need a tetanus shot if:  You cannot remember when you had your last tetanus shot.  You have never had a tetanus shot.  The injury broke your skin. If you get a tetanus shot, your arm may swell, get red, and feel warm to the touch. This is common and not a problem. If you need a tetanus shot and you choose not to have one, there is a rare chance of getting tetanus. Sickness from tetanus can be serious.  HOME CARE INSTRUCTIONS   If a dressing was applied, change it at least once a day or as directed by your caregiver. If the bandage sticks, soak it off with warm water.   Wash the area with water and a mild soap to remove all the ointment 2 times a day. Rinse off the soap and pat the area dry with a clean towel.   Reapply any ointment as directed by your caregiver. This will help prevent infection and keep the bandage from sticking. Use gauze over the wound and under the dressing to help keep the bandage from sticking.   Change your dressing right away if it becomes wet or dirty.   Only take over-the-counter or prescription medicines for pain, discomfort, or fever as  directed by your caregiver.   Follow up with your caregiver within 24-48 hours for a wound check, or as directed. If you were not given a wound-check appointment, look closely at your abrasion for redness, swelling, or pus. These are signs of infection. SEEK IMMEDIATE MEDICAL CARE IF:   You have increasing pain in the wound.   You have redness, swelling, or tenderness around the wound.   You have pus coming from the wound.   You have a fever or persistent symptoms for more than 2-3 days.  You have a fever and your symptoms suddenly get worse.  You have a bad smell coming from the wound or dressing.  MAKE SURE YOU:   Understand these instructions.  Will watch your condition.  Will get help right away if you are not doing well or get worse. Document Released: 06/13/2005 Document Revised: 08/20/2012 Document Reviewed: 08/07/2011 [facility] Patient Information 2015 [place], [place]. This information is not intended to replace advice given to you by your health care provider. Make sure you discuss any questions you have with your health care provider.  Migraine Headache A migraine headache is an intense, throbbing pain on one or both sides of your head. A migraine can last for 30 minutes to several hours. CAUSES  The exact cause of a migraine headache is not always known. However, a migraine may  be caused when nerves in the brain become irritated and release chemicals that cause inflammation. This causes pain. Certain things may also trigger migraines, such as:  Alcohol.  Smoking.  Stress.  Menstruation.  Aged cheeses.  Foods or drinks that contain nitrates, glutamate, aspartame, or tyramine.  Lack of sleep.  Chocolate.  Caffeine.  Hunger.  Physical exertion.  Fatigue.  Medicines used to treat chest pain (nitroglycerine), birth control pills, estrogen, and some blood pressure medicines. SIGNS AND SYMPTOMS  Pain on one or both sides of your head.  Pulsating or  throbbing pain.  Severe pain that prevents daily activities.  Pain that is aggravated by any physical activity.  Nausea, vomiting, or both.  Dizziness.  Pain with exposure to bright lights, loud noises, or activity.  General sensitivity to bright lights, loud noises, or smells. Before you get a migraine, you may get warning signs that a migraine is coming (aura). An aura may include:  Seeing flashing lights.  Seeing bright spots, halos, or zigzag lines.  Having tunnel vision or blurred vision.  Having feelings of numbness or tingling.  Having trouble talking.  Having muscle weakness. DIAGNOSIS  A migraine headache is often diagnosed based on:  Symptoms.  Physical exam.  A CT scan or MRI of your head. These imaging tests cannot diagnose migraines, but they can help rule out other causes of headaches. TREATMENT Medicines may be given for pain and nausea. Medicines can also be given to help prevent recurrent migraines.  HOME CARE INSTRUCTIONS  Only take over-the-counter or prescription medicines for pain or discomfort as directed by your health care provider. The use of long-term narcotics is not recommended.  Lie down in a dark, quiet room when you have a migraine.  Keep a journal to find out what may trigger your migraine headaches. For example, write down:  What you eat and drink.  How much sleep you get.  Any change to your diet or medicines.  Limit alcohol consumption.  Quit smoking if you smoke.  Get 7-9 hours of sleep, or as recommended by your health care provider.  Limit stress.  Keep lights dim if bright lights bother you and make your migraines worse. SEEK IMMEDIATE MEDICAL CARE IF:   Your migraine becomes severe.  You have a fever.  You have a stiff neck.  You have vision loss.  You have muscular weakness or loss of muscle control.  You start losing your balance or have trouble walking.  You feel faint or pass out.  You have severe  symptoms that are different from your first symptoms. MAKE SURE YOU:   Understand these instructions.  Will watch your condition.  Will get help right away if you are not doing well or get worse. Document Released: 09/03/2005 Document Revised: 01/18/2014 Document Reviewed: 05/11/2013 [facility] Patient Information 2015 [place], [place]. This information is not intended to replace advice given to you by your health care provider. Make sure you discuss any questions you have with your health care provider.

## 2015-05-23 ENCOUNTER — Emergency Department (HOSPITAL_BASED_OUTPATIENT_CLINIC_OR_DEPARTMENT_OTHER)
Admission: EM | Admit: 2015-05-23 | Discharge: 2015-05-23 | Disposition: A | Discharge status: Home / Self Care | Payer: Medicare Other | Attending: Emergency Medicine | Admitting: Emergency Medicine

## 2015-05-23 ENCOUNTER — Encounter (HOSPITAL_BASED_OUTPATIENT_CLINIC_OR_DEPARTMENT_OTHER): Payer: Self-pay

## 2015-05-23 DIAGNOSIS — E119 Type 2 diabetes mellitus without complications: Secondary | ICD-10-CM | POA: Diagnosis not present

## 2015-05-23 DIAGNOSIS — Z87448 Personal history of other diseases of urinary system: Secondary | ICD-10-CM | POA: Diagnosis not present

## 2015-05-23 DIAGNOSIS — Z91040 Latex allergy status: Secondary | ICD-10-CM | POA: Diagnosis not present

## 2015-05-23 DIAGNOSIS — Z87440 Personal history of urinary (tract) infections: Secondary | ICD-10-CM | POA: Diagnosis not present

## 2015-05-23 DIAGNOSIS — Z794 Long term (current) use of insulin: Secondary | ICD-10-CM | POA: Insufficient documentation

## 2015-05-23 DIAGNOSIS — E785 Hyperlipidemia, unspecified: Secondary | ICD-10-CM | POA: Insufficient documentation

## 2015-05-23 DIAGNOSIS — Z8719 Personal history of other diseases of the digestive system: Secondary | ICD-10-CM | POA: Insufficient documentation

## 2015-05-23 DIAGNOSIS — G43909 Migraine, unspecified, not intractable, without status migrainosus: Secondary | ICD-10-CM | POA: Insufficient documentation

## 2015-05-23 DIAGNOSIS — Z79899 Other long term (current) drug therapy: Secondary | ICD-10-CM | POA: Diagnosis not present

## 2015-05-23 MED ORDER — METOCLOPRAMIDE HCL 5 MG/ML IJ SOLN
10.0000 mg | Freq: Once | INTRAMUSCULAR | Status: AC
  Administered 2015-05-23: 10 mg via INTRAVENOUS
  Filled 2015-05-23: qty 2

## 2015-05-23 MED ORDER — KETOROLAC TROMETHAMINE 30 MG/ML IJ SOLN
30.0000 mg | Freq: Once | INTRAMUSCULAR | Status: AC
  Administered 2015-05-23: 30 mg via INTRAVENOUS
  Filled 2015-05-23: qty 1

## 2015-05-23 MED ORDER — DIPHENHYDRAMINE HCL 50 MG/ML IJ SOLN
25.0000 mg | Freq: Once | INTRAMUSCULAR | Status: AC
  Administered 2015-05-23: 25 mg via INTRAVENOUS
  Filled 2015-05-23: qty 1

## 2015-05-23 NOTE — ED Provider Notes (Signed)
CSN: [id number]     Arrival date & time 05/23/15  1627 History   First MD Initiated Contact with Patient 05/23/15 1749     Chief Complaint  Patient presents with  . Migraine     (Consider location/radiation/quality/duration/timing/severity/associated sxs/prior Treatment) HPI Comments: Patient with a history of Migraines presents today with a chief complaint of a right sided headache.  Pain has been constant since yesterday.  Gradual in onset.  She reports that her headache is similar to previous migraines.  She denies head injury or trauma.  She has taken Maxalt and Ultram for the pain without relief.  She reports associated photophobia and nausea.  She denies vomiting, fever, chills, neck pain/stiffness, double vision, blurred vision, weakness, numbness, or tingling.  She states that she has an appointment in 2 days to see her Neurologist and have botox injections.    The history is provided by the patient.    Past Medical History  Diagnosis Date  . Migraine   . Diabetes mellitus without complication   . Gastroparesis   . Hyperlipemia   . Cirrhosis   . UTI (lower urinary tract infection)   . Interstitial cystitis    Past Surgical History  Procedure Laterality Date  . Abdominal hysterectomy    . Appendectomy    . Abdominal surgery     No family history on file. Social History  Substance Use Topics  . Smoking status: Never Smoker   . Smokeless tobacco: None  . Alcohol Use: No   OB History    No data available     Review of Systems  All other systems reviewed and are negative.     Allergies  Darvon; Sulfa antibiotics; Talwin; and Latex  Home Medications   Prior to Admission medications   Medication Sig Start Date End Date Taking? Authorizing Provider  amitriptyline (ELAVIL) 50 MG tablet Take 50 mg by mouth at bedtime.    Historical Provider, MD  buPROPion (WELLBUTRIN SR) 100 MG 12 hr tablet Take 200 mg by mouth 2 (two) times daily.    Historical Provider, MD   buPROPion (WELLBUTRIN) 100 MG tablet Take 100 mg by mouth 2 (two) times daily.    Historical Provider, MD  clonazePAM (KLONOPIN) 0.5 MG tablet Take 0.5 mg by mouth as directed. .5mg  tablet 1-2 tablets in am, 1 tablet in afternoon, then 1 tablet at bedtime    Historical Provider, MD  fexofenadine (ALLEGRA) 60 MG tablet Take 1 tablet (60 mg total) by mouth 2 (two) times daily. 01/23/14   Malvin Johns, MD  HYDROcodone-acetaminophen (NORCO/VICODIN) 5-325 MG per tablet Take 1 tablet by mouth every 6 (six) hours as needed. 11/27/14   Varney Biles, MD  ibuprofen (ADVIL,MOTRIN) 600 MG tablet Take 1 tablet (600 mg total) by mouth every 6 (six) hours as needed. 11/27/14   Varney Biles, MD  insulin glargine (LANTUS) 100 UNIT/ML injection Inject into the skin at bedtime.    Historical Provider, MD  lamoTRIgine (LAMICTAL) 100 MG tablet Take 100 mg by mouth 2 (two) times daily.    Historical Provider, MD  mupirocin cream (BACTROBAN) 2 % Apply 1 application topically 2 (two) times daily. For 7 days 03/17/15   Malvin Johns, MD  pravastatin (PRAVACHOL) 20 MG tablet Take 20 mg by mouth daily.    Historical Provider, MD  pregabalin (LYRICA) 150 MG capsule Take 150 mg by mouth 2 (two) times daily.    Historical Provider, MD  promethazine (PHENERGAN) 25 MG tablet Take 1 tablet (25  mg total) by mouth every 6 (six) hours as needed for nausea. 11/27/14   Varney Biles, MD  QUEtiapine Fumarate (SEROQUEL PO) Take by mouth.    Historical Provider, MD  sitaGLIPtin-metformin (JANUMET) 50-1000 MG per tablet Take 1 tablet by mouth 2 (two) times daily with a meal.    Historical Provider, MD   BP 153/87 mmHg  Pulse 102  Temp(Src) 98.4 F (36.9 C) (Oral)  Resp 18  Ht 5\' 2"  (1.575 m)  Wt 140 lb (63.504 kg)  BMI 25.60 kg/m2  SpO2 95% Physical Exam  Constitutional: She appears well-developed and well-nourished.  HENT:  Head: Normocephalic and atraumatic.  Mouth/Throat: Oropharynx is clear and moist.  Eyes: EOM are normal.  Pupils are equal, round, and reactive to light.  Neck: Normal range of motion. Neck supple.  Cardiovascular: Normal rate, regular rhythm and normal heart sounds.   Pulmonary/Chest: Effort normal and breath sounds normal.  Musculoskeletal: Normal range of motion.  Neurological: She is alert. She has normal strength. No cranial nerve deficit or sensory deficit. Coordination and gait normal.  Normal finger to nose testing Normal rapid alternating movements Normal gait, no ataxia  Skin: Skin is warm and dry.  Psychiatric: She has a normal mood and affect.  Nursing note and vitals reviewed.   ED Course  Procedures (including critical care time) Labs Review Labs Reviewed - No data to display  Imaging Review No results found. I have personally reviewed and evaluated these images and lab results as part of my medical decision-making.   EKG Interpretation None     7:22 PM Patient reports significant improvement in headache at this time.  MDM   Final diagnoses:  None   Pt HA treated and improved while in ED.  Presentation is like pts typical migraine HA and non concerning for [facility], ICH, Meningitis, or temporal arteritis.  Headache gradual in onset.  Pt is afebrile with no focal neuro deficits, nuchal rigidity, or change in vision. Pt has follow up with neurology in 2 days.  Patient stable for discharge.  Return precautions given.   Pt verbalizes understanding and is agreeable with plan to dc.      Hyman Bible, PA-C 05/24/15 [place], MD 05/24/15 2008

## 2015-05-23 NOTE — ED Notes (Signed)
Migraine started yesterday-NAD

## 2015-05-23 NOTE — Discharge Instructions (Signed)

## 2015-09-27 ENCOUNTER — Emergency Department (HOSPITAL_BASED_OUTPATIENT_CLINIC_OR_DEPARTMENT_OTHER)
Admission: EM | Admit: 2015-09-27 | Discharge: 2015-09-27 | Disposition: A | Discharge status: Home / Self Care | Payer: Medicare Other | Attending: Emergency Medicine | Admitting: Emergency Medicine

## 2015-09-27 ENCOUNTER — Encounter (HOSPITAL_BASED_OUTPATIENT_CLINIC_OR_DEPARTMENT_OTHER): Payer: Self-pay

## 2015-09-27 DIAGNOSIS — E785 Hyperlipidemia, unspecified: Secondary | ICD-10-CM | POA: Insufficient documentation

## 2015-09-27 DIAGNOSIS — Z79899 Other long term (current) drug therapy: Secondary | ICD-10-CM | POA: Diagnosis not present

## 2015-09-27 DIAGNOSIS — Z91040 Latex allergy status: Secondary | ICD-10-CM | POA: Insufficient documentation

## 2015-09-27 DIAGNOSIS — R51 Headache: Secondary | ICD-10-CM | POA: Diagnosis present

## 2015-09-27 DIAGNOSIS — E119 Type 2 diabetes mellitus without complications: Secondary | ICD-10-CM | POA: Insufficient documentation

## 2015-09-27 DIAGNOSIS — Z87448 Personal history of other diseases of urinary system: Secondary | ICD-10-CM | POA: Insufficient documentation

## 2015-09-27 DIAGNOSIS — Z87440 Personal history of urinary (tract) infections: Secondary | ICD-10-CM | POA: Diagnosis not present

## 2015-09-27 DIAGNOSIS — Z8719 Personal history of other diseases of the digestive system: Secondary | ICD-10-CM | POA: Diagnosis not present

## 2015-09-27 DIAGNOSIS — Z794 Long term (current) use of insulin: Secondary | ICD-10-CM | POA: Insufficient documentation

## 2015-09-27 DIAGNOSIS — Z7984 Long term (current) use of oral hypoglycemic drugs: Secondary | ICD-10-CM | POA: Insufficient documentation

## 2015-09-27 DIAGNOSIS — G43809 Other migraine, not intractable, without status migrainosus: Secondary | ICD-10-CM | POA: Insufficient documentation

## 2015-09-27 MED ORDER — KETOROLAC TROMETHAMINE 30 MG/ML IJ SOLN
30.0000 mg | Freq: Once | INTRAMUSCULAR | Status: AC
Start: 2015-09-27 — End: 2015-09-27
  Administered 2015-09-27: 30 mg via INTRAVENOUS
  Filled 2015-09-27: qty 1

## 2015-09-27 MED ORDER — DIPHENHYDRAMINE HCL 50 MG/ML IJ SOLN
25.0000 mg | Freq: Once | INTRAMUSCULAR | Status: AC
  Administered 2015-09-27: 25 mg via INTRAVENOUS
  Filled 2015-09-27: qty 1

## 2015-09-27 MED ORDER — MAGNESIUM SULFATE 2 GM/50ML IV SOLN
2.0000 g | Freq: Once | INTRAVENOUS | Status: AC
  Administered 2015-09-27: 2 g via INTRAVENOUS
  Filled 2015-09-27: qty 50

## 2015-09-27 MED ORDER — METOCLOPRAMIDE HCL 5 MG/ML IJ SOLN
10.0000 mg | Freq: Once | INTRAMUSCULAR | Status: AC
  Administered 2015-09-27: 10 mg via INTRAVENOUS
  Filled 2015-09-27: qty 2

## 2015-09-27 NOTE — ED Notes (Signed)
C/o migraine x 3 days-dizziness x today-steady gait-NAD

## 2015-09-27 NOTE — Discharge Instructions (Signed)
Follow-up with your doctor or neurologist as needed for further evaluation of her migraine headaches. Return to ED for any new or worsening symptoms as we discussed.  Migraine Headache A migraine headache is an intense, throbbing pain on one or both sides of your head. A migraine can last for 30 minutes to several hours. CAUSES  The exact cause of a migraine headache is not always known. However, a migraine may be caused when nerves in the brain become irritated and release chemicals that cause inflammation. This causes pain. Certain things may also trigger migraines, such as:  Alcohol.  Smoking.  Stress.  Menstruation.  Aged cheeses.  Foods or drinks that contain nitrates, glutamate, aspartame, or tyramine.  Lack of sleep.  Chocolate.  Caffeine.  Hunger.  Physical exertion.  Fatigue.  Medicines used to treat chest pain (nitroglycerine), birth control pills, estrogen, and some blood pressure medicines. SIGNS AND SYMPTOMS  Pain on one or both sides of your head.  Pulsating or throbbing pain.  Severe pain that prevents daily activities.  Pain that is aggravated by any physical activity.  Nausea, vomiting, or both.  Dizziness.  Pain with exposure to bright lights, loud noises, or activity.  General sensitivity to bright lights, loud noises, or smells. Before you get a migraine, you may get warning signs that a migraine is coming (aura). An aura may include:  Seeing flashing lights.  Seeing bright spots, halos, or zigzag lines.  Having tunnel vision or blurred vision.  Having feelings of numbness or tingling.  Having trouble talking.  Having muscle weakness. DIAGNOSIS  A migraine headache is often diagnosed based on:  Symptoms.  Physical exam.  A CT scan or MRI of your head. These imaging tests cannot diagnose migraines, but they can help rule out other causes of headaches. TREATMENT Medicines may be given for pain and nausea. Medicines can also be  given to help prevent recurrent migraines.  HOME CARE INSTRUCTIONS  Only take over-the-counter or prescription medicines for pain or discomfort as directed by your health care provider. The use of long-term narcotics is not recommended.  Lie down in a dark, quiet room when you have a migraine.  Keep a journal to find out what may trigger your migraine headaches. For example, write down:  What you eat and drink.  How much sleep you get.  Any change to your diet or medicines.  Limit alcohol consumption.  Quit smoking if you smoke.  Get 7-9 hours of sleep, or as recommended by your health care provider.  Limit stress.  Keep lights dim if bright lights bother you and make your migraines worse. SEEK IMMEDIATE MEDICAL CARE IF:   Your migraine becomes severe.  You have a fever.  You have a stiff neck.  You have vision loss.  You have muscular weakness or loss of muscle control.  You start losing your balance or have trouble walking.  You feel faint or pass out.  You have severe symptoms that are different from your first symptoms. MAKE SURE YOU:   Understand these instructions.  Will watch your condition.  Will get help right away if you are not doing well or get worse.   This information is not intended to replace advice given to you by your health care provider. Make sure you discuss any questions you have with your health care provider.   Document Released: 09/03/2005 Document Revised: 09/24/2014 Document Reviewed: 05/11/2013 Elsevier Interactive Patient Education [employer].

## 2015-09-27 NOTE — ED Provider Notes (Signed)
CSN: [id number]     Arrival date & time 09/27/15  1312 History   First MD Initiated Contact with Patient 09/27/15 1340     Chief Complaint  Patient presents with  . Migraine     (Consider location/radiation/quality/duration/timing/severity/associated sxs/prior Treatment) HPI Tina Ramos is a 60 y.o. female with a history of migraines, comes in for evaluation of acute migraine. Patient reports she began to develop one of her typical migraines 2 days ago, gradual onset, with generalized head pain consistent with her typical pattern. She reports associated intermittent dizziness, nausea, photophobia, all symptoms consistent with her previous migraines. She reports she has a follow-up appointment with her neurologist next week for reevaluation. She takes Maxalt for her migraines, but she did not take it this time because she only had 1 pill left. Denies any numbness or weakness, neck pain or stiffness, injury, vision changes, difficulty speaking or swallowing. Rates discomfort now as 8/10. Nothing makes the problem better or worse. No other modifying factors.  Past Medical History  Diagnosis Date  . Migraine   . Diabetes mellitus without complication ([place])   . Gastroparesis   . Hyperlipemia   . Cirrhosis ([place])   . UTI (lower urinary tract infection)   . Interstitial cystitis    Past Surgical History  Procedure Laterality Date  . Abdominal hysterectomy    . Appendectomy    . Abdominal surgery     No family history on file. Social History  Substance Use Topics  . Smoking status: Never Smoker   . Smokeless tobacco: None  . Alcohol Use: No   OB History    No data available     Review of Systems A 10 point review of systems was completed and was negative except for pertinent positives and negatives as mentioned in the history of present illness     Allergies  Darvon; Sulfa antibiotics; Talwin; and Latex  Home Medications   Prior to Admission medications   Medication Sig  Start Date End Date Taking? Authorizing Provider  Atorvastatin Calcium (LIPITOR PO) Take by mouth.   Yes Historical Provider, MD  amitriptyline (ELAVIL) 50 MG tablet Take 50 mg by mouth at bedtime.    Historical Provider, MD  buPROPion (WELLBUTRIN SR) 100 MG 12 hr tablet Take 200 mg by mouth 2 (two) times daily.    Historical Provider, MD  buPROPion (WELLBUTRIN) 100 MG tablet Take 100 mg by mouth 2 (two) times daily.    Historical Provider, MD  clonazePAM (KLONOPIN) 0.5 MG tablet Take 0.5 mg by mouth as directed. .5mg  tablet 1-2 tablets in am, 1 tablet in afternoon, then 1 tablet at bedtime    Historical Provider, MD  fexofenadine (ALLEGRA) 60 MG tablet Take 1 tablet (60 mg total) by mouth 2 (two) times daily. 01/23/14   Malvin Johns, MD  HYDROcodone-acetaminophen (NORCO/VICODIN) 5-325 MG per tablet Take 1 tablet by mouth every 6 (six) hours as needed. 11/27/14   Varney Biles, MD  ibuprofen (ADVIL,MOTRIN) 600 MG tablet Take 1 tablet (600 mg total) by mouth every 6 (six) hours as needed. 11/27/14   Varney Biles, MD  insulin glargine (LANTUS) 100 UNIT/ML injection Inject into the skin at bedtime.    Historical Provider, MD  lamoTRIgine (LAMICTAL) 100 MG tablet Take 100 mg by mouth 2 (two) times daily.    Historical Provider, MD  mupirocin cream (BACTROBAN) 2 % Apply 1 application topically 2 (two) times daily. For 7 days 03/17/15   Malvin Johns, MD  pravastatin (PRAVACHOL)  20 MG tablet Take 20 mg by mouth daily.    Historical Provider, MD  pregabalin (LYRICA) 150 MG capsule Take 150 mg by mouth 2 (two) times daily.    Historical Provider, MD  promethazine (PHENERGAN) 25 MG tablet Take 1 tablet (25 mg total) by mouth every 6 (six) hours as needed for nausea. 11/27/14   Varney Biles, MD  QUEtiapine Fumarate (SEROQUEL PO) Take by mouth.    Historical Provider, MD  sitaGLIPtin-metformin (JANUMET) 50-1000 MG per tablet Take 1 tablet by mouth 2 (two) times daily with a meal.    Historical Provider, MD    BP 145/71 mmHg  Pulse 106  Temp(Src) 97.7 F (36.5 C) (Oral)  Resp 16  Ht 5' 0.5" (1.537 m)  Wt 63.504 kg  BMI 26.88 kg/m2  SpO2 98% Physical Exam  Constitutional: She is oriented to person, place, and time. She appears well-developed and well-nourished. No distress.  Well appearing Caucasian female  HENT:  Head: Normocephalic and atraumatic.  Mouth/Throat: Oropharynx is clear and moist.  Eyes: Conjunctivae and EOM are normal. Pupils are equal, round, and reactive to light. Right eye exhibits no discharge. Left eye exhibits no discharge. No scleral icterus.  Neck: Normal range of motion. Neck supple.  No meningismus or nuchal rigidity  Cardiovascular: Normal rate, regular rhythm and normal heart sounds.   Pulmonary/Chest: Effort normal and breath sounds normal. No respiratory distress. She has no wheezes. She has no rales.  Abdominal: Soft. There is no tenderness.  Musculoskeletal: Normal range of motion. She exhibits no edema or tenderness.  Neurological: She is alert and oriented to person, place, and time.  Cranial Nerves III-XII grossly intact. Motor strength 5/5 in all 4 extremities. Sensation intact to light touch. Gait baseline. Completes finger to nose coordination movements without any difficulty or ataxia. No pronator drift.  Skin: Skin is warm and dry. No rash noted. She is not diaphoretic.  Psychiatric: She has a normal mood and affect.  Nursing note and vitals reviewed.   ED Course  Procedures (including critical care time) Labs Review Labs Reviewed - No data to display  Imaging Review No results found. I have personally reviewed and evaluated these images and lab results as part of my medical decision-making.   EKG Interpretation None     Meds given in ED:  Medications  ketorolac (TORADOL) 30 MG/ML injection 30 mg (30 mg Intravenous Given 09/27/15 1418)  metoCLOPramide (REGLAN) injection 10 mg (10 mg Intravenous Given 09/27/15 1419)  diphenhydrAMINE  (BENADRYL) injection 25 mg (25 mg Intravenous Given 09/27/15 1419)  magnesium sulfate IVPB 2 g 50 mL (0 g Intravenous Stopped 09/27/15 1528)    Discharge Medication List as of 09/27/2015  3:29 PM     Filed Vitals:   09/27/15 1321  BP: 145/71  Pulse: 106  Temp: 97.7 F (36.5 C)  TempSrc: Oral  Resp: 16  Height: 5' 0.5" (1.537 m)  Weight: 63.504 kg  SpO2: 98%    MDM  Vitals stable, afebrile. Suspect very mild tachycardia due to pain. None on my exam. Feels better after analgesia in ED. headache has resolved. Normal neurological exam. No dizziness. Gait baseline. HA gradual in onset, progressively worsening. Similar to previous HA No hx of traumatic injury No hx of clotting disorder, peripartum or postpartum state, Immunocompromise Doubt Meningitis, CVA/SAH/ICH, Dural Venous Thrombosis, Eclampsia, Seizure, Central Lesion or Tumor, Giant Cell Arteritis, Cervical/Carotid or other vascular dissection. I have personally reviewed all labs, imaging, nursing/prvious notes during the patient's evaluation in  the ED today. No evidence of other acute or emergent pathology that requires immediate intervention at this time. Pt stable, in good condition and is appropriate for discharge. DC with instructions to follow up with PCP and/or neurologist within 48 hrs for further evaluation and management of symptoms.   Final diagnoses:  Other migraine without status migrainosus, not intractable        Comer Locket, PA-C 09/27/15 1631  Charlesetta Shanks, MD 09/29/15 1334

## 2016-05-29 ENCOUNTER — Encounter (HOSPITAL_BASED_OUTPATIENT_CLINIC_OR_DEPARTMENT_OTHER): Payer: Self-pay

## 2016-05-29 ENCOUNTER — Emergency Department (HOSPITAL_BASED_OUTPATIENT_CLINIC_OR_DEPARTMENT_OTHER)
Admission: EM | Admit: 2016-05-29 | Discharge: 2016-05-29 | Disposition: A | Discharge status: Home / Self Care | Payer: Medicare Other | Attending: Emergency Medicine | Admitting: Emergency Medicine

## 2016-05-29 DIAGNOSIS — T814XXA Infection following a procedure, initial encounter: Secondary | ICD-10-CM | POA: Insufficient documentation

## 2016-05-29 DIAGNOSIS — Z791 Long term (current) use of non-steroidal anti-inflammatories (NSAID): Secondary | ICD-10-CM | POA: Insufficient documentation

## 2016-05-29 DIAGNOSIS — R51 Headache: Secondary | ICD-10-CM | POA: Diagnosis present

## 2016-05-29 DIAGNOSIS — Z794 Long term (current) use of insulin: Secondary | ICD-10-CM | POA: Insufficient documentation

## 2016-05-29 DIAGNOSIS — E119 Type 2 diabetes mellitus without complications: Secondary | ICD-10-CM | POA: Insufficient documentation

## 2016-05-29 DIAGNOSIS — M7989 Other specified soft tissue disorders: Secondary | ICD-10-CM | POA: Insufficient documentation

## 2016-05-29 DIAGNOSIS — Y828 Other medical devices associated with adverse incidents: Secondary | ICD-10-CM | POA: Diagnosis not present

## 2016-05-29 DIAGNOSIS — Z79899 Other long term (current) drug therapy: Secondary | ICD-10-CM | POA: Insufficient documentation

## 2016-05-29 DIAGNOSIS — Z91040 Latex allergy status: Secondary | ICD-10-CM | POA: Insufficient documentation

## 2016-05-29 DIAGNOSIS — G43809 Other migraine, not intractable, without status migrainosus: Secondary | ICD-10-CM | POA: Diagnosis not present

## 2016-05-29 DIAGNOSIS — T8140XA Infection following a procedure, unspecified, initial encounter: Secondary | ICD-10-CM

## 2016-05-29 MED ORDER — KETOROLAC TROMETHAMINE 30 MG/ML IJ SOLN
30.0000 mg | Freq: Once | INTRAMUSCULAR | Status: AC
  Administered 2016-05-29: 30 mg via INTRAVENOUS
  Filled 2016-05-29: qty 1

## 2016-05-29 MED ORDER — DIPHENHYDRAMINE HCL 50 MG/ML IJ SOLN
25.0000 mg | Freq: Once | INTRAMUSCULAR | Status: AC
  Administered 2016-05-29: 25 mg via INTRAVENOUS
  Filled 2016-05-29: qty 1

## 2016-05-29 MED ORDER — SODIUM CHLORIDE 0.9 % IV BOLUS (SEPSIS)
1000.0000 mL | Freq: Once | INTRAVENOUS | Status: AC
  Administered 2016-05-29: 1000 mL via INTRAVENOUS

## 2016-05-29 MED ORDER — PROCHLORPERAZINE EDISYLATE 5 MG/ML IJ SOLN
10.0000 mg | Freq: Once | INTRAMUSCULAR | Status: AC
  Administered 2016-05-29: 10 mg via INTRAVENOUS
  Filled 2016-05-29: qty 2

## 2016-05-29 NOTE — ED Triage Notes (Signed)
Pt reports recurrent migraine x4 days. Associated nausea, light sensitivity. States PCP unable to return phone call, due for Botox injection. Pt had pin's placed to left third finger a week ago and area is red, swollen, warm to touch. Ortho f/u scheduled Thursday.

## 2016-05-29 NOTE — ED Provider Notes (Signed)
[facility] Provider Note   CSN: [id number] Arrival date & time: 05/29/16  1312     History   Chief Complaint Chief Complaint  Patient presents with  . Migraine    HPI Tina Ramos is a 60 y.o. female.  HPI Patient presents to the emergency department withMigraine headache along with redness to the surgical site of the third digit, left hand.  Patient states that she has had migraine headache for the last 4 days.  She states that she was given Phenergan and Toradol relief of her symptoms 2 days ago.  Patient, states she has also had redness and swelling to an area where she had pins placed on the third digit left hand.  Patient states that she called her orthopedist and advised to ice and elevate.  Patient states that she does have some photophobia associated with the headache. The patient denies chest pain, shortness of breath,,blurred vision, neck pain, fever, cough, weakness, numbness, dizziness, anorexia, edema, abdominal pain, nausea, vomiting, diarrhea, rash, back pain, dysuria, hematemesis, bloody stool, near syncope, or syncope. Past Medical History:  Diagnosis Date  . Cirrhosis ([place])   . Diabetes mellitus without complication ([place])   . Gastroparesis   . Hyperlipemia   . Interstitial cystitis   . Migraine   . UTI (lower urinary tract infection)     There are no active problems to display for this patient.   Past Surgical History:  Procedure Laterality Date  . ABDOMINAL HYSTERECTOMY    . ABDOMINAL SURGERY    . APPENDECTOMY    . FINGER SURGERY Left   . TOE SURGERY      OB History    No data available       Home Medications    Prior to Admission medications   Medication Sig Start Date End Date Taking? Authorizing Provider  amitriptyline (ELAVIL) 50 MG tablet Take 50 mg by mouth at bedtime.   Yes Historical Provider, MD  Atorvastatin Calcium (LIPITOR PO) Take by mouth.   Yes Historical Provider, MD  buPROPion (WELLBUTRIN SR) 100 MG 12 hr  tablet Take 200 mg by mouth 2 (two) times daily.   Yes Historical Provider, MD  buPROPion (WELLBUTRIN) 100 MG tablet Take 100 mg by mouth 2 (two) times daily.   Yes Historical Provider, MD  clonazePAM (KLONOPIN) 0.5 MG tablet Take 0.5 mg by mouth as directed. .5mg  tablet 1-2 tablets in am, 1 tablet in afternoon, then 1 tablet at bedtime   Yes Historical Provider, MD  fexofenadine (ALLEGRA) 60 MG tablet Take 1 tablet (60 mg total) by mouth 2 (two) times daily. 01/23/14  Yes Malvin Johns, MD  HYDROcodone-acetaminophen (NORCO/VICODIN) 5-325 MG per tablet Take 1 tablet by mouth every 6 (six) hours as needed. 11/27/14  Yes Varney Biles, MD  ibuprofen (ADVIL,MOTRIN) 600 MG tablet Take 1 tablet (600 mg total) by mouth every 6 (six) hours as needed. 11/27/14  Yes Varney Biles, MD  insulin glargine (LANTUS) 100 UNIT/ML injection Inject into the skin at bedtime.   Yes Historical Provider, MD  lamoTRIgine (LAMICTAL) 100 MG tablet Take 100 mg by mouth 2 (two) times daily.   Yes Historical Provider, MD  mupirocin cream (BACTROBAN) 2 % Apply 1 application topically 2 (two) times daily. For 7 days 03/17/15  Yes Malvin Johns, MD  pravastatin (PRAVACHOL) 20 MG tablet Take 20 mg by mouth daily.   Yes Historical Provider, MD  pregabalin (LYRICA) 150 MG capsule Take 150 mg by mouth 2 (two) times daily.  Yes Historical Provider, MD  promethazine (PHENERGAN) 25 MG tablet Take 1 tablet (25 mg total) by mouth every 6 (six) hours as needed for nausea. 11/27/14  Yes Varney Biles, MD  QUEtiapine Fumarate (SEROQUEL PO) Take by mouth.   Yes Historical Provider, MD  sitaGLIPtin-metformin (JANUMET) 50-1000 MG per tablet Take 1 tablet by mouth 2 (two) times daily with a meal.   Yes Historical Provider, MD    Family History History reviewed. No pertinent family history.  Social History Social History  Substance Use Topics  . Smoking status: Never Smoker  . Smokeless tobacco: Never Used  . Alcohol use No     Allergies    Darvon [propoxyphene]; Sulfa antibiotics; Talwin [pentazocine]; and Latex   Review of Systems Review of Systems All other systems negative except as documented in the HPI. All pertinent positives and negatives as reviewed in the HPI.  Physical Exam Updated Vital Signs BP 115/62   Pulse 79   Temp 97.5 F (36.4 C) (Oral)   Resp 18   Ht 5\' 2"  (1.575 m)   Wt 69.6 kg   SpO2 96%   BMI 28.08 kg/m   Physical Exam  Constitutional: She is oriented to person, place, and time. She appears well-developed and well-nourished. No distress.  HENT:  Head: Normocephalic and atraumatic.  Mouth/Throat: Oropharynx is clear and moist.  Eyes: EOM are normal. Pupils are equal, round, and reactive to light.  Neck: Normal range of motion. Neck supple.  Cardiovascular: Normal rate, regular rhythm and normal heart sounds.  Exam reveals no gallop and no friction rub.   No murmur heard. Pulmonary/Chest: Effort normal and breath sounds normal. No respiratory distress. She has no wheezes.  Musculoskeletal:       Hands: Neurological: She is alert and oriented to person, place, and time. She exhibits normal muscle tone. Coordination normal.  Skin: Skin is warm and dry. No rash noted. No erythema.  Psychiatric: She has a normal mood and affect. Her behavior is normal.  Nursing note and vitals reviewed.    ED Treatments / Results  Labs (all labs ordered are listed, but only abnormal results are displayed) Labs Reviewed - No data to display  EKG  EKG Interpretation None       Radiology No results found.  Procedures Procedures (including critical care time)  Medications Ordered in ED Medications  sodium chloride 0.9 % bolus 1,000 mL (0 mLs Intravenous Stopped 05/29/16 1533)  ketorolac (TORADOL) 30 MG/ML injection 30 mg (30 mg Intravenous Given 05/29/16 1412)  prochlorperazine (COMPAZINE) injection 10 mg (10 mg Intravenous Given 05/29/16 1412)  diphenhydrAMINE (BENADRYL) injection 25 mg (25 mg  Intravenous Given 05/29/16 1412)     Initial Impression / Assessment and Plan / ED Course  I have reviewed the triage vital signs and the nursing notes.  Pertinent labs & imaging results that were available during my care of the patient were reviewed by me and considered in my medical decision making (see chart for details).  Clinical Course   I spoke with the patient's orthopedist, and they want to see her at 9:30 in the morning for further evaluation of the finger.  They advised no antibiotics at this time.  Patient's headache has completely resolved with IV fluids, pain medication consisting of Toradol, Compazine and Benadryl  Final Clinical Impressions(s) / ED Diagnoses   Final diagnoses:  None    New Prescriptions New Prescriptions   No medications on file        Clyde  Rual Vermeer, PA-C 05/31/16 4709    Davonna Belling, MD 06/06/16 [phone]

## 2016-05-29 NOTE — Discharge Instructions (Signed)
You will need to see your orthopedist in the morning at 9 AM at the [place] location.  Return here as needed

## 2018-09-01 ENCOUNTER — Encounter (HOSPITAL_BASED_OUTPATIENT_CLINIC_OR_DEPARTMENT_OTHER): Payer: Self-pay | Admitting: *Deleted

## 2018-09-01 ENCOUNTER — Emergency Department (HOSPITAL_BASED_OUTPATIENT_CLINIC_OR_DEPARTMENT_OTHER)
Admission: EM | Admit: 2018-09-01 | Discharge: 2018-09-01 | Disposition: A | Discharge status: Home / Self Care | Payer: Medicare Other | Attending: Emergency Medicine | Admitting: Emergency Medicine

## 2018-09-01 ENCOUNTER — Emergency Department (HOSPITAL_BASED_OUTPATIENT_CLINIC_OR_DEPARTMENT_OTHER): Payer: Medicare Other

## 2018-09-01 ENCOUNTER — Other Ambulatory Visit: Payer: Self-pay

## 2018-09-01 DIAGNOSIS — Z91040 Latex allergy status: Secondary | ICD-10-CM | POA: Insufficient documentation

## 2018-09-01 DIAGNOSIS — Z794 Long term (current) use of insulin: Secondary | ICD-10-CM | POA: Insufficient documentation

## 2018-09-01 DIAGNOSIS — R0789 Other chest pain: Secondary | ICD-10-CM | POA: Insufficient documentation

## 2018-09-01 DIAGNOSIS — Z7982 Long term (current) use of aspirin: Secondary | ICD-10-CM | POA: Diagnosis not present

## 2018-09-01 DIAGNOSIS — E119 Type 2 diabetes mellitus without complications: Secondary | ICD-10-CM | POA: Insufficient documentation

## 2018-09-01 DIAGNOSIS — Z79899 Other long term (current) drug therapy: Secondary | ICD-10-CM | POA: Insufficient documentation

## 2018-09-01 DIAGNOSIS — R42 Dizziness and giddiness: Secondary | ICD-10-CM | POA: Diagnosis not present

## 2018-09-01 DIAGNOSIS — R079 Chest pain, unspecified: Secondary | ICD-10-CM | POA: Diagnosis present

## 2018-09-01 LAB — BASIC METABOLIC PANEL
Anion gap: 9 (ref 5–15)
BUN: 22 mg/dL (ref 8–23)
CO2: 18 mmol/L — AB (ref 22–32)
Calcium: 7.6 mg/dL — ABNORMAL LOW (ref 8.9–10.3)
Chloride: 109 mmol/L (ref 98–111)
Creatinine, Ser: 2.66 mg/dL — ABNORMAL HIGH (ref 0.44–1.00)
GFR calc Af Amer: 21 mL/min — ABNORMAL LOW (ref >60)
GFR calc non Af Amer: 18 mL/min — ABNORMAL LOW (ref >60)
GLUCOSE: 94 mg/dL (ref 70–99)
POTASSIUM: 4.2 mmol/L (ref 3.5–5.1)
Sodium: 136 mmol/L (ref 135–145)

## 2018-09-01 LAB — TROPONIN I: Troponin I: <0.03 ng/mL (ref <0.03)

## 2018-09-01 LAB — CBC WITH DIFFERENTIAL/PLATELET
Abs Immature Granulocytes: 0.02 10*3/uL (ref 0.00–0.07)
BASOS ABS: 0 10*3/uL (ref 0.0–0.1)
BASOS PCT: 1 %
EOS ABS: 0.5 10*3/uL (ref 0.0–0.5)
Eosinophils Relative: 7 %
HCT: 29.8 % — ABNORMAL LOW (ref 36.0–46.0)
Hemoglobin: 9.6 g/dL — ABNORMAL LOW (ref 12.0–15.0)
Immature Granulocytes: 0 %
Lymphocytes Relative: 27 %
Lymphs Abs: 1.7 10*3/uL (ref 0.7–4.0)
MCH: 31.7 pg (ref 26.0–34.0)
MCHC: 32.2 g/dL (ref 30.0–36.0)
MCV: 98.3 fL (ref 80.0–100.0)
MONOS PCT: 8 %
Monocytes Absolute: 0.5 10*3/uL (ref 0.1–1.0)
NRBC: 0 % (ref 0.0–0.2)
Neutro Abs: 3.7 10*3/uL (ref 1.7–7.7)
Neutrophils Relative %: 57 %
PLATELETS: 113 10*3/uL — AB (ref 150–400)
RBC: 3.03 MIL/uL — ABNORMAL LOW (ref 3.87–5.11)
RDW: 13.8 % (ref 11.5–15.5)
Smear Review: NORMAL
WBC: 6.4 10*3/uL (ref 4.0–10.5)

## 2018-09-01 MED ORDER — SODIUM CHLORIDE 0.9 % IV BOLUS
1000.0000 mL | Freq: Once | INTRAVENOUS | Status: AC
  Administered 2018-09-01: 1000 mL via INTRAVENOUS

## 2018-09-01 NOTE — ED Provider Notes (Signed)
[facility] DEPART[facility] Provider Note   CSN: [id number] Arrival date & time: 09/01/18  0209     History   Chief Complaint Chief Complaint  Patient presents with  . chest pain and fall    HPI Tina Ramos is a 62 y.o. female.  HPI  This is a 62 year old female with a history of diabetes, hyperlipidemia who presents with chest pain.  Patient reports left-sided intermittent sharp chest pains that have been ongoing since she had an EGD on Thursday.  She reports she has been only taking fluids since that time.  She denies any significant pain with food intake.  She had some pain tonight prior to coming in.  She got up from her bed and fell.  She is unable to tell me whether she tripped and fell.  She does not recall the events.  She reports that she falls often.  She denies any associated chest pain, nausea, vomiting, diarrhea, fevers.  Patient reports some dizziness.  She currently is without chest pain.  Patient states that Seroquel at night and feels groggy.  Past Medical History:  Diagnosis Date  . Cirrhosis ([place])   . Diabetes mellitus without complication ([place])   . Gastroparesis   . Hyperlipemia   . Interstitial cystitis   . Migraine   . UTI (lower urinary tract infection)     There are no active problems to display for this patient.   Past Surgical History:  Procedure Laterality Date  . ABDOMINAL HYSTERECTOMY    . ABDOMINAL SURGERY    . APPENDECTOMY    . FINGER SURGERY Left   . TOE SURGERY       OB History   No obstetric history on file.      Home Medications    Prior to Admission medications   Medication Sig Start Date End Date Taking? Authorizing Provider  aspirin EC 81 MG tablet Take 81 mg by mouth daily.   Yes [provider]  atorvastatin (LIPITOR) 20 MG tablet Take 20 mg by mouth daily.   Yes [provider]  buPROPion (WELLBUTRIN SR) 150 MG 12 hr tablet Take 150 mg by mouth 2 (two) times daily.   Yes [provider]  calcitRIOL (ROCALTROL) 0.5 MCG capsule Take 0.5 mcg by mouth daily.   Yes [provider]  calcium carbonate (OSCAL) 1500 (600 Ca) MG TABS tablet Take 1,500 mg by mouth 2 (two) times daily with a meal.   Yes [provider]  estradiol (ESTRACE) 1 MG tablet Take 1 mg by mouth daily.   Yes [provider]  ferrous sulfate 325 (65 FE) MG tablet Take 325 mg by mouth daily with breakfast.   Yes [provider]  gabapentin (NEURONTIN) 100 MG capsule Take 100 mg by mouth 3 (three) times daily.   Yes [provider]  insulin detemir (LEVEMIR) 100 UNIT/ML injection Inject into the skin daily.   Yes [provider]  lamoTRIgine (LAMICTAL) 25 MG tablet Take 25 mg by mouth daily.   Yes [provider]  metoprolol succinate (TOPROL-XL) 25 MG 24 hr tablet Take 25 mg by mouth daily.   Yes [provider]  mirtazapine (REMERON) 15 MG tablet Take 15 mg by mouth at bedtime.   Yes [provider]  oxybutynin (DITROPAN-XL) 10 MG 24 hr tablet Take 10 mg by mouth at bedtime.   Yes [provider]  pantoprazole (PROTONIX) 40 MG tablet Take 40 mg by mouth daily.  Yes [provider]  QUEtiapine (SEROQUEL) 300 MG tablet Take 300 mg by mouth at bedtime.   Yes [provider]  rizatriptan (MAXALT) 10 MG tablet Take 10 mg by mouth as needed for migraine. May repeat in 2 hours if needed   Yes [provider]  sevelamer carbonate (RENVELA) 800 MG tablet Take 800 mg by mouth 3 (three) times daily with meals.   Yes [provider]  tiZANidine (ZANAFLEX) 4 MG tablet Take 4 mg by mouth every 6 (six) hours as needed for muscle spasms.   Yes [provider]  amitriptyline (ELAVIL) 50 MG tablet Take 50 mg by mouth at bedtime.    [provider]  Atorvastatin Calcium (LIPITOR PO) Take by mouth.    [provider]  buPROPion (WELLBUTRIN SR) 100 MG 12 hr tablet Take 200  mg by mouth 2 (two) times daily.    [provider]  buPROPion (WELLBUTRIN) 100 MG tablet Take 100 mg by mouth 2 (two) times daily.    [provider]  clonazePAM (KLONOPIN) 0.5 MG tablet Take 0.5 mg by mouth as directed. .5mg  tablet 1-2 tablets in am, 1 tablet in afternoon, then 1 tablet at bedtime    [provider]  fexofenadine (ALLEGRA) 60 MG tablet Take 1 tablet (60 mg total) by mouth 2 (two) times daily. 01/23/14   Malvin Johns, MD  HYDROcodone-acetaminophen (NORCO/VICODIN) 5-325 MG per tablet Take 1 tablet by mouth every 6 (six) hours as needed. 11/27/14   Varney Biles, MD  ibuprofen (ADVIL,MOTRIN) 600 MG tablet Take 1 tablet (600 mg total) by mouth every 6 (six) hours as needed. 11/27/14   Varney Biles, MD  insulin glargine (LANTUS) 100 UNIT/ML injection Inject into the skin at bedtime.    [provider]  lamoTRIgine (LAMICTAL) 100 MG tablet Take 100 mg by mouth 2 (two) times daily.    [provider]  mupirocin cream (BACTROBAN) 2 % Apply 1 application topically 2 (two) times daily. For 7 days 03/17/15   Malvin Johns, MD  pravastatin (PRAVACHOL) 20 MG tablet Take 20 mg by mouth daily.    [provider]  pregabalin (LYRICA) 150 MG capsule Take 150 mg by mouth 2 (two) times daily.    [provider]  promethazine (PHENERGAN) 25 MG tablet Take 1 tablet (25 mg total) by mouth every 6 (six) hours as needed for nausea. 11/27/14   Varney Biles, MD  QUEtiapine Fumarate (SEROQUEL PO) Take by mouth.    [provider]  sitaGLIPtin-metformin (JANUMET) 50-1000 MG per tablet Take 1 tablet by mouth 2 (two) times daily with a meal.    [provider]    Family History No family history on file.  Social History Social History   Tobacco Use  . Smoking status: Never Smoker  . Smokeless tobacco: Never Used  Substance Use Topics  . Alcohol use: No  . Drug use: No     Allergies   Darvon [propoxyphene];  Sulfa antibiotics; Talwin [pentazocine]; and Latex   Review of Systems Review of Systems  Constitutional: Negative for fever.  Respiratory: Negative for shortness of breath.   Cardiovascular: Positive for chest pain. Negative for leg swelling.  Gastrointestinal: Negative for abdominal pain, nausea and vomiting.  Genitourinary: Negative for dysuria.  All other systems reviewed and are negative.    Physical Exam Updated Vital Signs BP (!) 103/50   Pulse 63   Temp 97.9 F (36.6 C) (Oral)   Resp 14  Ht 1.575 m (5\' 2" )   Wt 68.5 kg   SpO2 99%   BMI 27.62 kg/m   Physical Exam Vitals signs and nursing note reviewed.  Constitutional:      Appearance: She is well-developed. She is not ill-appearing or diaphoretic.  HENT:     Head: Normocephalic and atraumatic.  Eyes:     Pupils: Pupils are equal, round, and reactive to light.  Neck:     Musculoskeletal: Normal range of motion and neck supple.  Cardiovascular:     Rate and Rhythm: Normal rate and regular rhythm.     Heart sounds: Normal heart sounds.  Pulmonary:     Effort: Pulmonary effort is normal. No respiratory distress.     Breath sounds: No wheezing.  Abdominal:     General: Bowel sounds are normal.     Palpations: Abdomen is soft.     Tenderness: There is no abdominal tenderness.     Hernia: No hernia is present.  Skin:    General: Skin is warm and dry.  Neurological:     Mental Status: She is alert and oriented to person, place, and time.     Comments: Slow to answer questions but oriented x3, 5 out of 5 strength in all 4 extremities, normal gait,      ED Treatments / Results  Labs (all labs ordered are listed, but only abnormal results are displayed) Labs Reviewed  CBC WITH DIFFERENTIAL/PLATELET - Abnormal; Notable for the following components:      Result Value   RBC 3.03 (*)    Hemoglobin 9.6 (*)    HCT 29.8 (*)    Platelets 113 (*)    All other components within normal limits  BASIC METABOLIC  PANEL - Abnormal; Notable for the following components:   CO2 18 (*)    Creatinine, Ser 2.66 (*)    Calcium 7.6 (*)    GFR calc non Af Amer 18 (*)    GFR calc Af Amer 21 (*)    All other components within normal limits  TROPONIN I    EKG EKG Interpretation  Date/Time:  Monday September 01 2018 02:36:37 EST Ventricular Rate:  68 PR Interval:    QRS Duration: 100 QT Interval:  441 QTC Calculation: 469 R Axis:   76 Text Interpretation:  Sinus rhythm Low voltage, precordial leads Confirmed by Thayer Jew [phone]) on 09/01/2018 5:10:53 AM   Radiology Dg Chest 2 View  Result Date: 09/01/2018 CLINICAL DATA:  Chest pain. EXAM: CHEST - 2 VIEW COMPARISON:  06/08/2018 FINDINGS: The cardiomediastinal contours are normal. Heart size upper normal, unchanged from prior. Three clips project over the esophagus. No pneumomediastinum. Pulmonary vasculature is normal. No consolidation, pleural effusion, or pneumothorax. No acute osseous abnormalities are seen. Two clips project over the stomach. IMPRESSION: No acute pulmonary process. Electronically Signed   By: Keith Rake M.D.   On: 09/01/2018 04:14   Ct Head Wo Contrast  Result Date: 09/01/2018 CLINICAL DATA:  Head trauma, minor, GCS>=13, high clinical risk, initial exam. Lost balance leading to fall. EXAM: CT HEAD WITHOUT CONTRAST TECHNIQUE: Contiguous axial images were obtained from the base of the skull through the vertex without intravenous contrast. COMPARISON:  Head CT 07/25/2018 FINDINGS: Brain: No intracranial hemorrhage, mass effect, or midline shift. No hydrocephalus. The basilar cisterns are patent. No evidence of territorial infarct or acute ischemia. No extra-axial or intracranial fluid collection. Vascular: No hyperdense vessel. Skull: No fracture or focal lesion. Sinuses/Orbits: Paranasal sinuses and mastoid air  cells are clear. The visualized orbits are unremarkable. Bilateral cataract resection. Other: None. IMPRESSION: No  acute intracranial abnormality. No skull fracture. Electronically Signed   By: Keith Rake M.D.   On: 09/01/2018 04:20    Procedures Procedures (including critical care time)  Medications Ordered in ED Medications  sodium chloride 0.9 % bolus 1,000 mL (0 mLs Intravenous Stopped 09/01/18 0608)     Initial Impression / Assessment and Plan / ED Course  I have reviewed the triage vital signs and the nursing notes.  Pertinent labs & imaging results that were available during my care of the patient were reviewed by me and considered in my medical decision making (see chart for details).     Patient presents with chest pain.  Atypical in nature and ongoing since an EGD on Thursday.  Reports good oral intake without any association with eating.  She does report a fall.  Unclear whether she passed out or this was a mechanical fall.  EKG shows no evidence of arrhythmia.  Chest x-ray shows no signs of pneumothorax, pneumonia, effusion to suggest esophageal rupture.  Troponin is negative.  Doubt ACS given atypical nature.  Patient did get dizzy upon standing.  She has a history of orthostatic hypotension based on chart review.  Last known creatinine was greater than 3 during hospitalization in October.  Today her creatinine is 2.22.  White count is reassuring.  Patient was given fluids.  She states she feels much better.  She is able to stand without dizziness at this point.  Suspect she may have some esophageal irritation causing some discomfort.  However, at this point do not feel her story is consistent with ACS, PE, or esophageal complication.  Recommend follow-up with primary physician and gastroenterologist.  After history, exam, and medical workup I feel the patient has been appropriately medically screened and is safe for discharge home. Pertinent diagnoses were discussed with the patient. Patient was given return precautions.  Final Clinical Impressions(s) / ED Diagnoses   Final diagnoses:   Atypical chest pain  Dizziness    ED Discharge Orders    None       Merryl Hacker, MD 09/01/18 [phone]

## 2018-09-01 NOTE — Discharge Instructions (Addendum)
Your seen today for chest pain and a fall.  Your work-up is reassuring.  Follow-up closely with your primary physician.  Make sure that you are staying hydrated.

## 2018-09-01 NOTE — ED Notes (Signed)
Patient transported to CT 

## 2018-09-01 NOTE — ED Triage Notes (Signed)
Pt states she had a procedure one week ago. Unable to state what she had done,but sounds like a GI procedure for difficulty swallowing. Pt c/o anterior chest pain that started around 6pm when she was laying in the bed. States pain is constant. Describes as a sharp shooting type. Denies any other symptoms. Pt states she lost her balance and she fell around 1am. Denies any loc or denies hitting her head.

## 2018-10-02 ENCOUNTER — Encounter (HOSPITAL_BASED_OUTPATIENT_CLINIC_OR_DEPARTMENT_OTHER): Payer: Self-pay

## 2018-10-02 ENCOUNTER — Emergency Department (HOSPITAL_BASED_OUTPATIENT_CLINIC_OR_DEPARTMENT_OTHER)
Admission: EM | Admit: 2018-10-02 | Discharge: 2018-10-02 | Disposition: A | Discharge status: Home / Self Care | Payer: Medicare Other | Attending: Emergency Medicine | Admitting: Emergency Medicine

## 2018-10-02 ENCOUNTER — Other Ambulatory Visit: Payer: Self-pay

## 2018-10-02 DIAGNOSIS — R112 Nausea with vomiting, unspecified: Secondary | ICD-10-CM | POA: Diagnosis not present

## 2018-10-02 DIAGNOSIS — R197 Diarrhea, unspecified: Secondary | ICD-10-CM | POA: Insufficient documentation

## 2018-10-02 DIAGNOSIS — Z7982 Long term (current) use of aspirin: Secondary | ICD-10-CM | POA: Insufficient documentation

## 2018-10-02 DIAGNOSIS — Z794 Long term (current) use of insulin: Secondary | ICD-10-CM | POA: Diagnosis not present

## 2018-10-02 DIAGNOSIS — E119 Type 2 diabetes mellitus without complications: Secondary | ICD-10-CM | POA: Diagnosis not present

## 2018-10-02 DIAGNOSIS — Z91040 Latex allergy status: Secondary | ICD-10-CM | POA: Diagnosis not present

## 2018-10-02 DIAGNOSIS — Z79899 Other long term (current) drug therapy: Secondary | ICD-10-CM | POA: Diagnosis not present

## 2018-10-02 DIAGNOSIS — Z7902 Long term (current) use of antithrombotics/antiplatelets: Secondary | ICD-10-CM | POA: Diagnosis not present

## 2018-10-02 LAB — COMPREHENSIVE METABOLIC PANEL
ALT: 20 U/L (ref 0–44)
AST: 28 U/L (ref 15–41)
Albumin: 3.8 g/dL (ref 3.5–5.0)
Alkaline Phosphatase: 70 U/L (ref 38–126)
Anion gap: 10 (ref 5–15)
BUN: 30 mg/dL — AB (ref 8–23)
CO2: 20 mmol/L — ABNORMAL LOW (ref 22–32)
Calcium: 8.6 mg/dL — ABNORMAL LOW (ref 8.9–10.3)
Chloride: 107 mmol/L (ref 98–111)
Creatinine, Ser: 2.4 mg/dL — ABNORMAL HIGH (ref 0.44–1.00)
GFR calc Af Amer: 24 mL/min — ABNORMAL LOW (ref >60)
GFR, EST NON AFRICAN AMERICAN: 21 mL/min — AB (ref >60)
Glucose, Bld: 119 mg/dL — ABNORMAL HIGH (ref 70–99)
Potassium: 3.9 mmol/L (ref 3.5–5.1)
Sodium: 137 mmol/L (ref 135–145)
Total Bilirubin: 0.4 mg/dL (ref 0.3–1.2)
Total Protein: 6.8 g/dL (ref 6.5–8.1)

## 2018-10-02 LAB — CBC WITH DIFFERENTIAL/PLATELET
Abs Immature Granulocytes: 0.04 10*3/uL (ref 0.00–0.07)
Basophils Absolute: 0.1 10*3/uL (ref 0.0–0.1)
Basophils Relative: 1 %
Eosinophils Absolute: 0.4 10*3/uL (ref 0.0–0.5)
Eosinophils Relative: 7 %
HCT: 35.7 % — ABNORMAL LOW (ref 36.0–46.0)
Hemoglobin: 11 g/dL — ABNORMAL LOW (ref 12.0–15.0)
Immature Granulocytes: 1 %
Lymphocytes Relative: 22 %
Lymphs Abs: 1.3 10*3/uL (ref 0.7–4.0)
MCH: 31 pg (ref 26.0–34.0)
MCHC: 30.8 g/dL (ref 30.0–36.0)
MCV: 100.6 fL — ABNORMAL HIGH (ref 80.0–100.0)
MONOS PCT: 12 %
Monocytes Absolute: 0.7 10*3/uL (ref 0.1–1.0)
NEUTROS PCT: 57 %
Neutro Abs: 3.3 10*3/uL (ref 1.7–7.7)
Platelets: 149 10*3/uL — ABNORMAL LOW (ref 150–400)
RBC: 3.55 MIL/uL — ABNORMAL LOW (ref 3.87–5.11)
RDW: 13.2 % (ref 11.5–15.5)
WBC: 5.7 10*3/uL (ref 4.0–10.5)
nRBC: 0 % (ref 0.0–0.2)

## 2018-10-02 LAB — LIPASE, BLOOD: Lipase: 32 U/L (ref 11–51)

## 2018-10-02 MED ORDER — BENZONATATE 100 MG PO CAPS: 100.0000 mg | ORAL_CAPSULE | Freq: Three times a day (TID) | ORAL | 0 refills | Status: DC

## 2018-10-02 MED ORDER — ONDANSETRON 4 MG PO TBDP: ORAL_TABLET | ORAL | 0 refills | Status: AC

## 2018-10-02 MED ORDER — SODIUM CHLORIDE 0.9 % IV BOLUS
1000.0000 mL | Freq: Once | INTRAVENOUS | Status: AC
  Administered 2018-10-02: 1000 mL via INTRAVENOUS

## 2018-10-02 MED ORDER — ONDANSETRON HCL 4 MG/2ML IJ SOLN
4.0000 mg | Freq: Once | INTRAMUSCULAR | Status: AC
  Administered 2018-10-02: 4 mg via INTRAVENOUS
  Filled 2018-10-02: qty 2

## 2018-10-02 MED ORDER — FENTANYL CITRATE (PF) 100 MCG/2ML IJ SOLN: 12.5000 ug | Freq: Once | INTRAMUSCULAR | Status: DC

## 2018-10-02 MED FILL — BENZONATATE 100 MG CAP: 100 | 7 days supply | Qty: 21 | Fill #0

## 2018-10-02 MED FILL — ONDANSETRON ODT 4 MG TABLET: 4 | 3 days supply | Qty: 20 | Fill #0

## 2018-10-02 NOTE — ED Notes (Signed)
After triage pt stood from triage-took steps to pick up her belongings from chair-back foot hit against scale-pt lost balance-fell onto scale with buttocks hitting scale and left side into wall-pt to standing with minimal assist-states she hit left ear on wall-no break in skin noted-denies pain-states "I'm ok. I feel fine"-pt with steady gait after-during fall assessment screening prior to pt stated she had multiple recent falls and stated "but I feel good today"-M. Lina Sar Agricultural consultant notified

## 2018-10-02 NOTE — Discharge Instructions (Addendum)
Follow up with your PCP.  Return for worsening symptoms.

## 2018-10-02 NOTE — ED Triage Notes (Signed)
C/o n/v/d x 4 days-NAD-steady gait

## 2018-10-02 NOTE — ED Notes (Addendum)
C/o n/v/d onset Sunday,  Congestion, runny nose ,  Sinus pressure

## 2018-10-02 NOTE — ED Provider Notes (Signed)
[facility] EMERGENCY DEPARTMENT Provider Note   CSN: [id number] Arrival date & time: 10/02/18  1202     History   Chief Complaint Chief Complaint  Patient presents with  . Emesis    HPI Tina Ramos is a 63 y.o. female.  63 yo F with a cc of n/v/d.  Going on for past four days.  No fevers.  No dark stool or blood in her stool.  Having trouble keeping anything down.  Denies urinary symptoms. No noted sick contacts.    The history is provided by the patient.  Emesis  Associated symptoms: diarrhea   Associated symptoms: no arthralgias, no chills, no fever, no headaches and no myalgias   Illness  Severity:  Moderate Onset quality:  Sudden Duration:  4 days Timing:  Constant Progression:  Worsening Chronicity:  New Associated symptoms: diarrhea, nausea and vomiting   Associated symptoms: no chest pain, no congestion, no fever, no headaches, no myalgias, no rhinorrhea, no shortness of breath and no wheezing     Past Medical History:  Diagnosis Date  . Cirrhosis ([place])   . Diabetes mellitus without complication ([place])   . Gastroparesis   . Hyperlipemia   . Interstitial cystitis   . Migraine   . UTI (lower urinary tract infection)     There are no active problems to display for this patient.   Past Surgical History:  Procedure Laterality Date  . ABDOMINAL HYSTERECTOMY    . ABDOMINAL SURGERY    . APPENDECTOMY    . ESOPHAGEAL DILATION    . FINGER SURGERY Left   . TOE SURGERY       OB History   No obstetric history on file.      Home Medications    Prior to Admission medications   Medication Sig Start Date End Date Taking? Authorizing Provider  amitriptyline (ELAVIL) 50 MG tablet Take 50 mg by mouth at bedtime.    [provider]  aspirin EC 81 MG tablet Take 81 mg by mouth daily.    [provider]  atorvastatin (LIPITOR) 20 MG tablet Take 20 mg by mouth daily.    [provider]  Atorvastatin Calcium (LIPITOR PO)  Take by mouth.    [provider]  buPROPion (WELLBUTRIN SR) 100 MG 12 hr tablet Take 200 mg by mouth 2 (two) times daily.    [provider]  buPROPion (WELLBUTRIN SR) 150 MG 12 hr tablet Take 150 mg by mouth 2 (two) times daily.    [provider]  buPROPion (WELLBUTRIN) 100 MG tablet Take 100 mg by mouth 2 (two) times daily.    [provider]  calcitRIOL (ROCALTROL) 0.5 MCG capsule Take 0.5 mcg by mouth daily.    [provider]  calcium carbonate (OSCAL) 1500 (600 Ca) MG TABS tablet Take 1,500 mg by mouth 2 (two) times daily with a meal.    [provider]  clonazePAM (KLONOPIN) 0.5 MG tablet Take 0.5 mg by mouth as directed. .5mg  tablet 1-2 tablets in am, 1 tablet in afternoon, then 1 tablet at bedtime    [provider]  estradiol (ESTRACE) 1 MG tablet Take 1 mg by mouth daily.    [provider]  ferrous sulfate 325 (65 FE) MG tablet Take 325 mg by mouth daily with breakfast.    [provider]  fexofenadine (ALLEGRA) 60 MG tablet Take 1 tablet (60 mg total) by mouth 2 (two) times daily. 01/23/14   Malvin Johns,  MD  gabapentin (NEURONTIN) 100 MG capsule Take 100 mg by mouth 3 (three) times daily.    [provider]  HYDROcodone-acetaminophen (NORCO/VICODIN) 5-325 MG per tablet Take 1 tablet by mouth every 6 (six) hours as needed. 11/27/14   Varney Biles, MD  ibuprofen (ADVIL,MOTRIN) 600 MG tablet Take 1 tablet (600 mg total) by mouth every 6 (six) hours as needed. 11/27/14   Varney Biles, MD  insulin detemir (LEVEMIR) 100 UNIT/ML injection Inject into the skin daily.    [provider]  insulin glargine (LANTUS) 100 UNIT/ML injection Inject into the skin at bedtime.    [provider]  lamoTRIgine (LAMICTAL) 100 MG tablet Take 100 mg by mouth 2 (two) times daily.    [provider]  lamoTRIgine (LAMICTAL) 25 MG tablet Take 25 mg by mouth daily.    [provider]    metoprolol succinate (TOPROL-XL) 25 MG 24 hr tablet Take 25 mg by mouth daily.    [provider]  mirtazapine (REMERON) 15 MG tablet Take 15 mg by mouth at bedtime.    [provider]  mupirocin cream (BACTROBAN) 2 % Apply 1 application topically 2 (two) times daily. For 7 days 03/17/15   Malvin Johns, MD  ondansetron (ZOFRAN ODT) 4 MG disintegrating tablet 4mg  ODT q4 hours prn nausea/vomit 10/02/18   Deno Etienne, DO  oxybutynin (DITROPAN-XL) 10 MG 24 hr tablet Take 10 mg by mouth at bedtime.    [provider]  pantoprazole (PROTONIX) 40 MG tablet Take 40 mg by mouth daily.    [provider]  pravastatin (PRAVACHOL) 20 MG tablet Take 20 mg by mouth daily.    [provider]  pregabalin (LYRICA) 150 MG capsule Take 150 mg by mouth 2 (two) times daily.    [provider]  promethazine (PHENERGAN) 25 MG tablet Take 1 tablet (25 mg total) by mouth every 6 (six) hours as needed for nausea. 11/27/14   Varney Biles, MD  QUEtiapine (SEROQUEL) 300 MG tablet Take 300 mg by mouth at bedtime.    [provider]  QUEtiapine Fumarate (SEROQUEL PO) Take by mouth.    [provider]  rizatriptan (MAXALT) 10 MG tablet Take 10 mg by mouth as needed for migraine. May repeat in 2 hours if needed    [provider]  sevelamer carbonate (RENVELA) 800 MG tablet Take 800 mg by mouth 3 (three) times daily with meals.    [provider]  sitaGLIPtin-metformin (JANUMET) 50-1000 MG per tablet Take 1 tablet by mouth 2 (two) times daily with a meal.    [provider]  tiZANidine (ZANAFLEX) 4 MG tablet Take 4 mg by mouth every 6 (six) hours as needed for muscle spasms.    [provider]    Family History No family history on file.  Social History Social History   Tobacco Use  . Smoking status: Never Smoker  . Smokeless tobacco: Never Used  Substance Use Topics  . Alcohol use: No  . Drug use: No      Allergies   Darvon [propoxyphene]; Sulfa antibiotics; Talwin [pentazocine]; and Latex   Review of Systems Review of Systems  Constitutional: Negative for chills and fever.  HENT: Negative for congestion and rhinorrhea.   Eyes: Negative for redness and visual disturbance.  Respiratory: Negative for shortness of breath and wheezing.   Cardiovascular: Negative for chest pain and palpitations.  Gastrointestinal: Positive for diarrhea, nausea and vomiting.  Genitourinary: Negative for dysuria and urgency.  Musculoskeletal: Negative for arthralgias and myalgias.  Skin: Negative for pallor and wound.  Neurological: Negative for dizziness and headaches.     Physical Exam Updated Vital Signs BP (!) 132/54 (BP Location: Right Arm)   Pulse 81   Temp 97.6 F (36.4 C) (Oral)   Resp 18   Ht 5\' 2"  (1.575 m)   Wt 64.2 kg   SpO2 99%   BMI 25.88 kg/m   Physical Exam Vitals signs and nursing note reviewed.  Constitutional:      General: She is not in acute distress.    Appearance: She is well-developed. She is not diaphoretic.  HENT:     Head: Normocephalic and atraumatic.  Eyes:     Pupils: Pupils are equal, round, and reactive to light.  Neck:     Musculoskeletal: Normal range of motion and neck supple.  Cardiovascular:     Rate and Rhythm: Normal rate and regular rhythm.     Heart sounds: No murmur. No friction rub. No gallop.   Pulmonary:     Effort: Pulmonary effort is normal.     Breath sounds: No wheezing or rales.  Abdominal:     General: There is no distension.     Palpations: Abdomen is soft.     Tenderness: There is no abdominal tenderness.  Musculoskeletal:        General: No tenderness.     Comments: Left AV fistula with palpable thrill  Skin:    General: Skin is warm and dry.  Neurological:     Mental Status: She is alert and oriented to person, place, and time.  Psychiatric:        Behavior: Behavior normal.      ED Treatments / Results   Labs (all labs ordered are listed, but only abnormal results are displayed) Labs Reviewed  CBC WITH DIFFERENTIAL/PLATELET - Abnormal; Notable for the following components:      Result Value   RBC 3.55 (*)    Hemoglobin 11.0 (*)    HCT 35.7 (*)    MCV 100.6 (*)    Platelets 149 (*)    All other components within normal limits  COMPREHENSIVE METABOLIC PANEL - Abnormal; Notable for the following components:   CO2 20 (*)    Glucose, Bld 119 (*)    BUN 30 (*)    Creatinine, Ser 2.40 (*)    Calcium 8.6 (*)    GFR calc non Af Amer 21 (*)    GFR calc Af Amer 24 (*)    All other components within normal limits  LIPASE, BLOOD    EKG None  Radiology No results found.  Procedures Procedures (including critical care time)  Medications Ordered in ED Medications  fentaNYL (SUBLIMAZE) injection 12.5 mcg (12.5 mcg Intravenous Not Given 10/02/18 1317)  sodium chloride 0.9 % bolus 1,000 mL ( Intravenous Stopped 10/02/18 1406)  ondansetron (ZOFRAN) injection 4 mg (4 mg Intravenous Given 10/02/18 1312)     Initial Impression / Assessment and Plan / ED Course  I have reviewed the triage vital signs and the nursing notes.  Pertinent labs & imaging results that were available during my care of the patient were reviewed by me and considered in my medical decision making (see chart for details).     63 yo F with a cc of n/v/d.  Going on for the past 4 days. Worsening.  Sent from urgent care for fluids.  Well appearing and non toxic.   Patient is able to tolerate  p.o. here.  Given bolus of fluids with some improvement of her symptoms.  Her renal function appears to be at baseline.  Will discharge home.  2:21 PM:  I have discussed the diagnosis/risks/treatment options with the patient and believe the pt to be eligible for discharge home to follow-up with PCP. We also discussed returning to the ED immediately if new or worsening sx occur. We discussed the sx which are most concerning (e.g.,  sudden worsening pain, fever, inability to tolerate by mouth) that necessitate immediate return. Medications administered to the patient during their visit and any new prescriptions provided to the patient are listed below.  Medications given during this visit Medications  fentaNYL (SUBLIMAZE) injection 12.5 mcg (12.5 mcg Intravenous Not Given 10/02/18 1317)  sodium chloride 0.9 % bolus 1,000 mL ( Intravenous Stopped 10/02/18 1406)  ondansetron (ZOFRAN) injection 4 mg (4 mg Intravenous Given 10/02/18 1312)     The patient appears reasonably screen and/or stabilized for discharge and I doubt any other medical condition or other [facility] requiring further screening, evaluation, or treatment in the ED at this time prior to discharge.    Final Clinical Impressions(s) / ED Diagnoses   Final diagnoses:  Nausea vomiting and diarrhea    ED Discharge Orders         Ordered    ondansetron (ZOFRAN ODT) 4 MG disintegrating tablet     10/02/18 1409    benzonatate (TESSALON) 100 MG capsule  Every 8 hours,   Status:  Discontinued     10/02/18 [place], [place], DO 10/02/18 1421

## 2019-12-06 ENCOUNTER — Other Ambulatory Visit: Payer: Self-pay

## 2019-12-06 ENCOUNTER — Emergency Department (HOSPITAL_BASED_OUTPATIENT_CLINIC_OR_DEPARTMENT_OTHER)
Admission: EM | Admit: 2019-12-06 | Discharge: 2019-12-06 | Disposition: A | Discharge status: Home / Self Care | Payer: Medicare Other | Attending: Emergency Medicine | Admitting: Emergency Medicine

## 2019-12-06 ENCOUNTER — Encounter (HOSPITAL_BASED_OUTPATIENT_CLINIC_OR_DEPARTMENT_OTHER): Payer: Self-pay | Admitting: Emergency Medicine

## 2019-12-06 DIAGNOSIS — E119 Type 2 diabetes mellitus without complications: Secondary | ICD-10-CM | POA: Insufficient documentation

## 2019-12-06 DIAGNOSIS — Z7982 Long term (current) use of aspirin: Secondary | ICD-10-CM | POA: Insufficient documentation

## 2019-12-06 DIAGNOSIS — Z794 Long term (current) use of insulin: Secondary | ICD-10-CM | POA: Diagnosis not present

## 2019-12-06 DIAGNOSIS — E875 Hyperkalemia: Secondary | ICD-10-CM | POA: Diagnosis not present

## 2019-12-06 DIAGNOSIS — M79604 Pain in right leg: Secondary | ICD-10-CM | POA: Insufficient documentation

## 2019-12-06 DIAGNOSIS — Z91040 Latex allergy status: Secondary | ICD-10-CM | POA: Diagnosis not present

## 2019-12-06 DIAGNOSIS — M792 Neuralgia and neuritis, unspecified: Secondary | ICD-10-CM | POA: Diagnosis not present

## 2019-12-06 DIAGNOSIS — Z79899 Other long term (current) drug therapy: Secondary | ICD-10-CM | POA: Insufficient documentation

## 2019-12-06 DIAGNOSIS — M79601 Pain in right arm: Secondary | ICD-10-CM | POA: Diagnosis present

## 2019-12-06 LAB — BASIC METABOLIC PANEL
Anion gap: 12 (ref 5–15)
BUN: 50 mg/dL — ABNORMAL HIGH (ref 8–23)
CO2: 22 mmol/L (ref 22–32)
Calcium: 9.4 mg/dL (ref 8.9–10.3)
Chloride: 99 mmol/L (ref 98–111)
Creatinine, Ser: 3.36 mg/dL — ABNORMAL HIGH (ref 0.44–1.00)
GFR calc Af Amer: 16 mL/min — ABNORMAL LOW (ref >60)
GFR calc non Af Amer: 14 mL/min — ABNORMAL LOW (ref >60)
Glucose, Bld: 88 mg/dL (ref 70–99)
Potassium: 5.3 mmol/L — ABNORMAL HIGH (ref 3.5–5.1)
Sodium: 133 mmol/L — ABNORMAL LOW (ref 135–145)

## 2019-12-06 LAB — CBC
HCT: 32.2 % — ABNORMAL LOW (ref 36.0–46.0)
Hemoglobin: 10.4 g/dL — ABNORMAL LOW (ref 12.0–15.0)
MCH: 33.3 pg (ref 26.0–34.0)
MCHC: 32.3 g/dL (ref 30.0–36.0)
MCV: 103.2 fL — ABNORMAL HIGH (ref 80.0–100.0)
Platelets: 115 10*3/uL — ABNORMAL LOW (ref 150–400)
RBC: 3.12 MIL/uL — ABNORMAL LOW (ref 3.87–5.11)
RDW: 13.3 % (ref 11.5–15.5)
WBC: 7.6 10*3/uL (ref 4.0–10.5)
nRBC: 0 % (ref 0.0–0.2)

## 2019-12-06 MED ORDER — SODIUM CHLORIDE 0.9 % IV BOLUS
1000.0000 mL | Freq: Once | INTRAVENOUS | Status: AC
  Administered 2019-12-06: 1000 mL via INTRAVENOUS

## 2019-12-06 NOTE — ED Triage Notes (Signed)
Pt c/o acute onset right arm pain x 45 mins pta. Pt denies CP, denies N/V, reports pain "took me to the floor".

## 2019-12-06 NOTE — Discharge Instructions (Addendum)
Please follow-up with your primary care provider regarding today's encounter.  Speak with your nephrologist, BMI, regarding your worsening kidney function.  You should also consider review of your medications to ensure that there is nothing that could be contributing to her worsening renal function.  As for your symptoms of discomfort, you may benefit from speaking with your neurologist regarding today's encounter.  Continue taking your Lyrica and Neurontin, as prescribed.  Return to the ED or seek immediate medical attention should you experience any new or worsening symptoms.

## 2019-12-06 NOTE — ED Notes (Signed)
Pt states she is feeling better, denies pain, and is requesting discharge. EDP notified and to bedside.

## 2019-12-06 NOTE — ED Provider Notes (Addendum)
[facility] DEPART[facility] Provider Note   CSN: [id number] Arrival date & time: 12/06/19  1819     History Chief Complaint  Patient presents with  . Arm Pain    Tina Ramos is a 64 y.o. female with PMH of migraine, interstitial cystitis, neuropathy, type II DM, and HLD presents to the ED with reports of 10 out of 10 "burning" and cramping pain affecting her right arm with radiating burning sensation to the right side of torso and down right leg.  She said that for a moment she had trouble flexing and extending her right leg due to her cramping burning pain.  The symptoms lasted approximately 10 minutes prior to resolution.  On exam, she still endorsing residual 5 out of 10 burning discomfort in the area of antecubital fossa right arm.  Patient denies any recent illness, fevers or chills, chest pain, shortness of breath, or other symptoms, but does report that she discontinued her desvenlafaxine abruptly 10 days ago after taking it regularly for approximately 6 months.  I reviewed patient's medical record and she had actually spoken with her neurologist on 12/02/2019 for worsening neck and shoulder discomfort.  She reports feeling fine yesterday and denies any recent illness, headache or dizziness, blurred vision, chest pain or difficulty breathing, cough, abdominal pain, nausea or vomiting, gait abnormalities, numbness, weakness, or other symptoms.  HPI     Past Medical History:  Diagnosis Date  . Cirrhosis ([place])   . Cirrhosis ([place])   . Diabetes mellitus without complication ([place])   . Gastroparesis   . Hyperlipemia   . Interstitial cystitis   . Migraine   . UTI (lower urinary tract infection)     There are no problems to display for this patient.   Past Surgical History:  Procedure Laterality Date  . ABDOMINAL HYSTERECTOMY    . ABDOMINAL SURGERY    . APPENDECTOMY    . ESOPHAGEAL DILATION    . FINGER SURGERY Left   . TOE SURGERY       OB History   No  obstetric history on file.     No family history on file.  Social History   Tobacco Use  . Smoking status: Never Smoker  . Smokeless tobacco: Never Used  Substance Use Topics  . Alcohol use: No  . Drug use: No    Home Medications Prior to Admission medications   Medication Sig Start Date End Date Taking? Authorizing Provider  amitriptyline (ELAVIL) 50 MG tablet Take 50 mg by mouth at bedtime.    [provider]  aspirin EC 81 MG tablet Take 81 mg by mouth daily.    [provider]  atorvastatin (LIPITOR) 20 MG tablet Take 20 mg by mouth daily.    [provider]  Atorvastatin Calcium (LIPITOR PO) Take by mouth.    [provider]  buPROPion (WELLBUTRIN SR) 100 MG 12 hr tablet Take 200 mg by mouth 2 (two) times daily.    [provider]  buPROPion (WELLBUTRIN SR) 150 MG 12 hr tablet Take 150 mg by mouth 2 (two) times daily.    [provider]  buPROPion (WELLBUTRIN) 100 MG tablet Take 100 mg by mouth 2 (two) times daily.    [provider]  calcitRIOL (ROCALTROL) 0.5 MCG capsule Take 0.5 mcg by mouth daily.    [provider]  calcium carbonate (OSCAL) 1500 (600 Ca) MG TABS tablet Take 1,500 mg by mouth 2 (two) times daily with a meal.  [provider]  clonazePAM (KLONOPIN) 0.5 MG tablet Take 0.5 mg by mouth as directed. .5mg  tablet 1-2 tablets in am, 1 tablet in afternoon, then 1 tablet at bedtime    [provider]  estradiol (ESTRACE) 1 MG tablet Take 1 mg by mouth daily.    [provider]  ferrous sulfate 325 (65 FE) MG tablet Take 325 mg by mouth daily with breakfast.    [provider]  fexofenadine (ALLEGRA) 60 MG tablet Take 1 tablet (60 mg total) by mouth 2 (two) times daily. 01/23/14   Malvin Johns, MD  gabapentin (NEURONTIN) 100 MG capsule Take 100 mg by mouth 3 (three) times daily.    [provider]  HYDROcodone-acetaminophen (NORCO/VICODIN) 5-325 MG  per tablet Take 1 tablet by mouth every 6 (six) hours as needed. 11/27/14   Varney Biles, MD  ibuprofen (ADVIL,MOTRIN) 600 MG tablet Take 1 tablet (600 mg total) by mouth every 6 (six) hours as needed. 11/27/14   Varney Biles, MD  insulin detemir (LEVEMIR) 100 UNIT/ML injection Inject into the skin daily.    [provider]  insulin glargine (LANTUS) 100 UNIT/ML injection Inject into the skin at bedtime.    [provider]  lamoTRIgine (LAMICTAL) 100 MG tablet Take 100 mg by mouth 2 (two) times daily.    [provider]  lamoTRIgine (LAMICTAL) 25 MG tablet Take 25 mg by mouth daily.    [provider]  metoprolol succinate (TOPROL-XL) 25 MG 24 hr tablet Take 25 mg by mouth daily.    [provider]  mirtazapine (REMERON) 15 MG tablet Take 15 mg by mouth at bedtime.    [provider]  mupirocin cream (BACTROBAN) 2 % Apply 1 application topically 2 (two) times daily. For 7 days 03/17/15   Malvin Johns, MD  ondansetron (ZOFRAN ODT) 4 MG disintegrating tablet 4mg  ODT q4 hours prn nausea/vomit 10/02/18   Deno Etienne, DO  oxybutynin (DITROPAN-XL) 10 MG 24 hr tablet Take 10 mg by mouth at bedtime.    [provider]  pantoprazole (PROTONIX) 40 MG tablet Take 40 mg by mouth daily.    [provider]  pravastatin (PRAVACHOL) 20 MG tablet Take 20 mg by mouth daily.    [provider]  pregabalin (LYRICA) 150 MG capsule Take 150 mg by mouth 2 (two) times daily.    [provider]  promethazine (PHENERGAN) 25 MG tablet Take 1 tablet (25 mg total) by mouth every 6 (six) hours as needed for nausea. 11/27/14   Varney Biles, MD  QUEtiapine (SEROQUEL) 300 MG tablet Take 300 mg by mouth at bedtime.    [provider]  QUEtiapine Fumarate (SEROQUEL PO) Take by mouth.    [provider]  rizatriptan (MAXALT) 10 MG tablet Take 10 mg by mouth as needed for migraine. May repeat in 2 hours if needed     [provider]  sevelamer carbonate (RENVELA) 800 MG tablet Take 800 mg by mouth 3 (three) times daily with meals.    [provider]  sitaGLIPtin-metformin (JANUMET) 50-1000 MG per tablet Take 1 tablet by mouth 2 (two) times daily with a meal.    [provider]  tiZANidine (ZANAFLEX) 4 MG tablet Take 4 mg by mouth every 6 (six) hours as needed for muscle spasms.    [provider]    Allergies    Darvon [propoxyphene], Sulfa antibiotics, Talwin [pentazocine], and Latex  Review of Systems   Review of Systems  All other systems reviewed and are negative.   Physical Exam Updated Vital Signs BP (!) 124/57 (BP Location: Right Arm)   Pulse 78   Temp 97.9 F (36.6 C) (Oral)   Resp 20   Ht 5\' 2"  (1.575 m)   Wt 66.7 kg   SpO2 96%   BMI 26.89 kg/m   Physical Exam Vitals and nursing note reviewed. Exam conducted with a chaperone present.  Constitutional:      General: She is not in acute distress.    Appearance: Normal appearance. She is not ill-appearing.  HENT:     Head: Normocephalic and atraumatic.  Eyes:     General: No scleral icterus.    Conjunctiva/sclera: Conjunctivae normal.  Neck:     Comments: No midline cervical TTP.  No overlying skin changes. Pulmonary:     Effort: Pulmonary effort is normal.  Musculoskeletal:     Cervical back: Normal range of motion and neck supple. No rigidity.     Comments: No thoracic, lumbar, or sacral midline TTP.  No overlying skin changes.  No erythema or masses appreciated. Right arm: ROM, strength, and sensation intact.  Distal pulses and cap refill intact.  Nontender. Right leg: ROM, strength, and sensation intact.  Distal pulses and cap refill intact.  Nontender. Right-sided torso: No overlying skin changes.  Nontender.  Skin:    General: Skin is dry.  Neurological:     Mental Status: She is alert.     GCS: GCS eye subscore is 4. GCS verbal subscore is 5. GCS motor subscore is 6.  Psychiatric:         Mood and Affect: Mood normal.        Behavior: Behavior normal.        Thought Content: Thought content normal.     ED Results / Procedures / Treatments   Labs (all labs ordered are listed, but only abnormal results are displayed) Labs Reviewed  CBC - Abnormal; Notable for the following components:      Result Value   RBC 3.12 (*)    Hemoglobin 10.4 (*)    HCT 32.2 (*)    MCV 103.2 (*)    Platelets 115 (*)    All other components within normal limits  BASIC METABOLIC PANEL - Abnormal; Notable for the following components:   Sodium 133 (*)    Potassium 5.3 (*)    BUN 50 (*)    Creatinine, Ser 3.36 (*)    GFR calc non Af Amer 14 (*)    GFR calc Af Amer 16 (*)    All other components within normal limits    EKG EKG Interpretation  Date/Time:  Sunday December 06 2019 19:58:21 EDT Ventricular Rate:  74 PR Interval:    QRS Duration: 96 QT Interval:  421 QTC Calculation: 468 R Axis:   83 Text Interpretation: Sinus rhythm No significant change since last tracing Confirmed by Lajean Saver [phone]) on 12/06/2019 8:37:15 PM   Radiology No results found.  Procedures Procedures (including critical care time)  Medications Ordered in ED Medications  sodium chloride 0.9 % bolus 1,000 mL (1,000 mLs Intravenous New Bag/Given 12/06/19 2050)    ED Course  I have reviewed the triage vital signs and the nursing notes.  Pertinent labs & imaging results that were available during my care of the patient were reviewed by me and considered in my medical decision making (see chart for details).    MDM Rules/Calculators/A&P  Patient physical exam is entirely reassuring.  She is neurovascular intact.  No diminished ROM, strength, or sensation.  She still endorses a 5 out of 10 "burning" discomfort in antecubital fossa right elbow, but otherwise her symptoms have abated.  She has been taking her Lyrica and gabapentin, as prescribed.  Considered radiculopathy  given right arm burning discomfort, but low suspicion given similar symptoms endorsed on right side of torso and right leg.  Also considered herpes zoster given burning pain, however does not follow specific dermatome and instead affects entire right side of body which is unlikely.  No overlying skin changes.  Symptoms almost entirely resolved prior to arrival which is also inconsistent with report.  Given her abrupt discontinuation of an SNRI consider possible withdrawal syndrome.    Patient was found to be hyperkalemic to 5.3.  This is likely attributable to her worsening CKD.  Her creatinine and GFR both appear to have worsened from labs obtained 1 year ago.  However, after reviewing Care Everywhere, it appears as though this has been realized to be an ongoing issue and not of any acute concern.  Will provide a liter of fluids to address her hyperkalemia and then have her discuss her medications and worsening renal function with her primary care provider as well as her nephrologist at [facility] in [facility], [place].  On reevaluation, patient is feeling entirely improved after 1 L normal saline.  Her description of burning discomfort is concerning for neuropathic pain, however she is already taking Lyrica and Neurontin.  Recommend that she follow-up with her neurologist regarding today's encounter.  Discussed case with Dr. Ashok Cordia who agrees with assessment and plan.  Final Clinical Impression(s) / ED Diagnoses Final diagnoses:  Hyperkalemia  Neuropathic pain    Rx / DC Orders ED Discharge Orders    None       Corena Herter, PA-C 12/06/19 2202    Corena Herter, PA-C 12/06/19 2205    Lajean Saver, MD 12/06/19 2308

## 2020-04-25 ENCOUNTER — Encounter (HOSPITAL_BASED_OUTPATIENT_CLINIC_OR_DEPARTMENT_OTHER): Payer: Self-pay | Admitting: *Deleted

## 2020-04-25 ENCOUNTER — Emergency Department (HOSPITAL_BASED_OUTPATIENT_CLINIC_OR_DEPARTMENT_OTHER): Payer: Medicare Other

## 2020-04-25 ENCOUNTER — Emergency Department (HOSPITAL_BASED_OUTPATIENT_CLINIC_OR_DEPARTMENT_OTHER)
Admission: EM | Admit: 2020-04-25 | Discharge: 2020-04-26 | Disposition: A | Discharge status: Home / Self Care | Payer: Medicare Other | Attending: Emergency Medicine | Admitting: Emergency Medicine

## 2020-04-25 ENCOUNTER — Other Ambulatory Visit: Payer: Self-pay

## 2020-04-25 DIAGNOSIS — E119 Type 2 diabetes mellitus without complications: Secondary | ICD-10-CM | POA: Insufficient documentation

## 2020-04-25 DIAGNOSIS — W010XXA Fall on same level from slipping, tripping and stumbling without subsequent striking against object, initial encounter: Secondary | ICD-10-CM | POA: Diagnosis not present

## 2020-04-25 DIAGNOSIS — Y939 Activity, unspecified: Secondary | ICD-10-CM | POA: Insufficient documentation

## 2020-04-25 DIAGNOSIS — Y929 Unspecified place or not applicable: Secondary | ICD-10-CM | POA: Diagnosis not present

## 2020-04-25 DIAGNOSIS — Z7982 Long term (current) use of aspirin: Secondary | ICD-10-CM | POA: Diagnosis not present

## 2020-04-25 DIAGNOSIS — Y999 Unspecified external cause status: Secondary | ICD-10-CM | POA: Insufficient documentation

## 2020-04-25 DIAGNOSIS — S42342A Displaced spiral fracture of shaft of humerus, left arm, initial encounter for closed fracture: Secondary | ICD-10-CM | POA: Diagnosis not present

## 2020-04-25 DIAGNOSIS — Z794 Long term (current) use of insulin: Secondary | ICD-10-CM | POA: Insufficient documentation

## 2020-04-25 DIAGNOSIS — Z91040 Latex allergy status: Secondary | ICD-10-CM | POA: Diagnosis not present

## 2020-04-25 DIAGNOSIS — S4992XA Unspecified injury of left shoulder and upper arm, initial encounter: Secondary | ICD-10-CM | POA: Diagnosis present

## 2020-04-25 MED ORDER — MORPHINE SULFATE (PF) 4 MG/ML IV SOLN
4.0000 mg | Freq: Once | INTRAVENOUS | Status: AC
  Administered 2020-04-25: 4 mg via INTRAMUSCULAR
  Filled 2020-04-25: qty 1

## 2020-04-25 NOTE — ED Triage Notes (Signed)
She tripped and fell. Injury to her left upper arm.

## 2020-04-25 NOTE — ED Notes (Signed)
ED Provider at bedside. 

## 2020-04-26 MED ORDER — HYDROCODONE-ACETAMINOPHEN 5-325 MG PO TABS: 1.0000 | ORAL_TABLET | Freq: Four times a day (QID) | ORAL | 0 refills | Status: DC | PRN

## 2020-04-26 NOTE — ED Notes (Signed)
ED Provider at bedside. 

## 2020-04-26 NOTE — ED Provider Notes (Signed)
[facility] EMERGENCY DEPARTMENT Provider Note   CSN: [id number] Arrival date & time: 04/25/20  2106     History Chief Complaint  Patient presents with  . Fall  . Arm Injury    Tina Ramos is a 64 y.o. female.  HPI     This is a 64 year old female with a history of cirrhosis, diabetes, hyperlipidemia who presents with left arm pain.  Patient reports that she tripped and fell on her left arm.  She denies hitting her head or loss of consciousness.  Denies syncope.  Rates her arm pain at 10 out of 10.  Denies numbness or tingling in the arm.  She is right-handed.  Patient does have a fistula in the left arm but is not currently on dialysis.  Past Medical History:  Diagnosis Date  . Cirrhosis ([place])   . Cirrhosis ([place])   . Diabetes mellitus without complication ([place])   . Gastroparesis   . Hyperlipemia   . Interstitial cystitis   . Migraine   . UTI (lower urinary tract infection)     There are no problems to display for this patient.   Past Surgical History:  Procedure Laterality Date  . ABDOMINAL HYSTERECTOMY    . ABDOMINAL SURGERY    . APPENDECTOMY    . ESOPHAGEAL DILATION    . FINGER SURGERY Left   . TOE SURGERY       OB History   No obstetric history on file.     No family history on file.  Social History   Tobacco Use  . Smoking status: Never Smoker  . Smokeless tobacco: Never Used  Vaping Use  . Vaping Use: Never used  Substance Use Topics  . Alcohol use: No  . Drug use: No    Home Medications Prior to Admission medications   Medication Sig Start Date End Date Taking? Authorizing Provider  amitriptyline (ELAVIL) 50 MG tablet Take 50 mg by mouth at bedtime.    [provider]  aspirin EC 81 MG tablet Take 81 mg by mouth daily.    [provider]  atorvastatin (LIPITOR) 20 MG tablet Take 20 mg by mouth daily.    [provider]  Atorvastatin Calcium (LIPITOR PO) Take by mouth.    [provider]    buPROPion (WELLBUTRIN SR) 100 MG 12 hr tablet Take 200 mg by mouth 2 (two) times daily.    [provider]  buPROPion (WELLBUTRIN SR) 150 MG 12 hr tablet Take 150 mg by mouth 2 (two) times daily.    [provider]  buPROPion (WELLBUTRIN) 100 MG tablet Take 100 mg by mouth 2 (two) times daily.    [provider]  calcitRIOL (ROCALTROL) 0.5 MCG capsule Take 0.5 mcg by mouth daily.    [provider]  calcium carbonate (OSCAL) 1500 (600 Ca) MG TABS tablet Take 1,500 mg by mouth 2 (two) times daily with a meal.    [provider]  clonazePAM (KLONOPIN) 0.5 MG tablet Take 0.5 mg by mouth as directed. .5mg  tablet 1-2 tablets in am, 1 tablet in afternoon, then 1 tablet at bedtime    [provider]  estradiol (ESTRACE) 1 MG tablet Take 1 mg by mouth daily.    [provider]  ferrous sulfate 325 (65 FE) MG tablet Take 325 mg by mouth daily with breakfast.    [provider]  fexofenadine (ALLEGRA) 60 MG tablet Take 1 tablet (60 mg total) by mouth 2 (two)  times daily. 01/23/14   Malvin Johns, MD  gabapentin (NEURONTIN) 100 MG capsule Take 100 mg by mouth 3 (three) times daily.    [provider]  HYDROcodone-acetaminophen (NORCO/VICODIN) 5-325 MG per tablet Take 1 tablet by mouth every 6 (six) hours as needed. 11/27/14   Varney Biles, MD  ibuprofen (ADVIL,MOTRIN) 600 MG tablet Take 1 tablet (600 mg total) by mouth every 6 (six) hours as needed. 11/27/14   Varney Biles, MD  insulin detemir (LEVEMIR) 100 UNIT/ML injection Inject into the skin daily.    [provider]  insulin glargine (LANTUS) 100 UNIT/ML injection Inject into the skin at bedtime.    [provider]  lamoTRIgine (LAMICTAL) 100 MG tablet Take 100 mg by mouth 2 (two) times daily.    [provider]  lamoTRIgine (LAMICTAL) 25 MG tablet Take 25 mg by mouth daily.    [provider]  metoprolol succinate (TOPROL-XL) 25 MG 24  hr tablet Take 25 mg by mouth daily.    [provider]  mirtazapine (REMERON) 15 MG tablet Take 15 mg by mouth at bedtime.    [provider]  mupirocin cream (BACTROBAN) 2 % Apply 1 application topically 2 (two) times daily. For 7 days 03/17/15   Malvin Johns, MD  ondansetron (ZOFRAN ODT) 4 MG disintegrating tablet 4mg  ODT q4 hours prn nausea/vomit 10/02/18   Deno Etienne, DO  oxybutynin (DITROPAN-XL) 10 MG 24 hr tablet Take 10 mg by mouth at bedtime.    [provider]  pantoprazole (PROTONIX) 40 MG tablet Take 40 mg by mouth daily.    [provider]  pravastatin (PRAVACHOL) 20 MG tablet Take 20 mg by mouth daily.    [provider]  pregabalin (LYRICA) 150 MG capsule Take 150 mg by mouth 2 (two) times daily.    [provider]  promethazine (PHENERGAN) 25 MG tablet Take 1 tablet (25 mg total) by mouth every 6 (six) hours as needed for nausea. 11/27/14   Varney Biles, MD  QUEtiapine (SEROQUEL) 300 MG tablet Take 300 mg by mouth at bedtime.    [provider]  QUEtiapine Fumarate (SEROQUEL PO) Take by mouth.    [provider]  rizatriptan (MAXALT) 10 MG tablet Take 10 mg by mouth as needed for migraine. May repeat in 2 hours if needed    [provider]  sevelamer carbonate (RENVELA) 800 MG tablet Take 800 mg by mouth 3 (three) times daily with meals.    [provider]  sitaGLIPtin-metformin (JANUMET) 50-1000 MG per tablet Take 1 tablet by mouth 2 (two) times daily with a meal.    [provider]  tiZANidine (ZANAFLEX) 4 MG tablet Take 4 mg by mouth every 6 (six) hours as needed for muscle spasms.    [provider]    Allergies    Darvon [propoxyphene], Sulfa antibiotics, Talwin [pentazocine], and Latex  Review of Systems   Review of Systems  Constitutional: Negative for fever.  Respiratory: Negative for shortness of breath.   Cardiovascular: Negative for chest pain.    Gastrointestinal: Negative for abdominal pain, nausea and vomiting.  Musculoskeletal:       Left arm pain  Neurological: Negative for weakness and numbness.  All other systems reviewed and are negative.   Physical Exam Updated Vital Signs BP (!) 157/60 (BP Location: Right Arm)   Pulse 87   Temp 99 F (37.2 C) (Oral)   Resp 16   Ht 1.575 m (5\' 2" )  Wt 62.1 kg   SpO2 98%   BMI 25.06 kg/m   Physical Exam Vitals and nursing note reviewed.  Constitutional:      Appearance: She is well-developed. She is obese. She is not ill-appearing.  HENT:     Head: Normocephalic and atraumatic.     Nose: Nose normal.     Mouth/Throat:     Mouth: Mucous membranes are moist.  Eyes:     Pupils: Pupils are equal, round, and reactive to light.  Cardiovascular:     Rate and Rhythm: Normal rate and regular rhythm.     Heart sounds: Normal heart sounds.  Pulmonary:     Effort: Pulmonary effort is normal. No respiratory distress.     Breath sounds: No wheezing.  Abdominal:     Palpations: Abdomen is soft.     Tenderness: There is no abdominal tenderness.  Musculoskeletal:     Cervical back: Neck supple.     Comments: Tenderness palpation mid left humerus, slight deformity noted, fistula with positive thrill, good distal neurovascular exam, finger amputation noted  Skin:    General: Skin is warm and dry.  Neurological:     Mental Status: She is alert and oriented to person, place, and time.  Psychiatric:        Mood and Affect: Mood normal.     ED Results / Procedures / Treatments   Labs (all labs ordered are listed, but only abnormal results are displayed) Labs Reviewed - No data to display  EKG None  Radiology DG Humerus Left  Result Date: 04/25/2020 CLINICAL DATA:  64 year old female with fall and trauma to the left upper extremity. EXAM: LEFT HUMERUS - 2+ VIEW COMPARISON:  None. FINDINGS: There is a mildly displaced spiral fracture of the proximal diaphysis of the left humerus  with approximately 11 mm medial displacement of the distal fracture fragment. The bones are osteopenic. There is no dislocation. The soft tissues are unremarkable. IMPRESSION: Mildly displaced spiral fracture of the proximal diaphysis of the left humerus. Electronically Signed   By: Anner Crete M.D.   On: 04/25/2020 21:51    Procedures Procedures (including critical care time)  Medications Ordered in ED Medications  morphine 4 MG/ML injection 4 mg (4 mg Intramuscular Given 04/25/20 2344)    ED Course  I have reviewed the triage vital signs and the nursing notes.  Pertinent labs & imaging results that were available during my care of the patient were reviewed by me and considered in my medical decision making (see chart for details).    MDM Rules/Calculators/A&P                           Patient presents following reported mechanical fall.  Overall nontoxic and vital signs are reassuring.  She has left arm pain.  She has no other obvious signs of trauma.  X-rays reviewed by myself and show a mildly displaced spiral fracture.  This is closed.  She does have a fistula in that arm but thrill is present and she is intact distally.  Will place in a sling and have her follow-up with orthopedics.  Will provide pain management.  After history, exam, and medical workup I feel the patient has been appropriately medically screened and is safe for discharge home. Pertinent diagnoses were discussed with the patient. Patient was given return precautions.   Final Clinical Impression(s) / ED Diagnoses Final diagnoses:  Closed displaced spiral fracture of shaft of left  humerus, initial encounter    Rx / DC Orders ED Discharge Orders    None       Carling Liberman, Barbette Hair, MD 04/26/20 0010

## 2020-04-26 NOTE — Discharge Instructions (Addendum)
You were seen today after a fall.  Maintain sling as you have a fracture of your left humerus.  Take pain medication as prescribed.  Do not drive while taking pain medication.  Follow-up with orthopedics.

## 2020-05-01 ENCOUNTER — Telehealth (HOSPITAL_BASED_OUTPATIENT_CLINIC_OR_DEPARTMENT_OTHER): Payer: Self-pay | Admitting: Emergency Medicine

## 2020-05-02 ENCOUNTER — Emergency Department (HOSPITAL_BASED_OUTPATIENT_CLINIC_OR_DEPARTMENT_OTHER): Payer: Medicare Other

## 2020-05-02 ENCOUNTER — Encounter (HOSPITAL_BASED_OUTPATIENT_CLINIC_OR_DEPARTMENT_OTHER): Payer: Self-pay

## 2020-05-02 ENCOUNTER — Inpatient Hospital Stay (HOSPITAL_BASED_OUTPATIENT_CLINIC_OR_DEPARTMENT_OTHER)
Admission: EM | Admit: 2020-05-02 | Discharge: 2020-05-12 | DRG: 981 | Disposition: A | Discharge status: Skilled Nursing Facility | Payer: Medicare Other | Attending: Family Medicine | Admitting: Family Medicine

## 2020-05-02 ENCOUNTER — Other Ambulatory Visit: Payer: Self-pay

## 2020-05-02 DIAGNOSIS — K5900 Constipation, unspecified: Secondary | ICD-10-CM

## 2020-05-02 DIAGNOSIS — S42352A Displaced comminuted fracture of shaft of humerus, left arm, initial encounter for closed fracture: Secondary | ICD-10-CM | POA: Diagnosis not present

## 2020-05-02 DIAGNOSIS — Z7982 Long term (current) use of aspirin: Secondary | ICD-10-CM

## 2020-05-02 DIAGNOSIS — Z20822 Contact with and (suspected) exposure to covid-19: Secondary | ICD-10-CM | POA: Diagnosis present

## 2020-05-02 DIAGNOSIS — I120 Hypertensive chronic kidney disease with stage 5 chronic kidney disease or end stage renal disease: Secondary | ICD-10-CM | POA: Diagnosis present

## 2020-05-02 DIAGNOSIS — E875 Hyperkalemia: Secondary | ICD-10-CM | POA: Diagnosis not present

## 2020-05-02 DIAGNOSIS — K567 Ileus, unspecified: Secondary | ICD-10-CM

## 2020-05-02 DIAGNOSIS — S065XAA Traumatic subdural hemorrhage with loss of consciousness status unknown, initial encounter: Secondary | ICD-10-CM | POA: Diagnosis present

## 2020-05-02 DIAGNOSIS — Z09 Encounter for follow-up examination after completed treatment for conditions other than malignant neoplasm: Secondary | ICD-10-CM

## 2020-05-02 DIAGNOSIS — F419 Anxiety disorder, unspecified: Secondary | ICD-10-CM | POA: Diagnosis present

## 2020-05-02 DIAGNOSIS — S022XXA Fracture of nasal bones, initial encounter for closed fracture: Secondary | ICD-10-CM | POA: Diagnosis present

## 2020-05-02 DIAGNOSIS — S0101XA Laceration without foreign body of scalp, initial encounter: Secondary | ICD-10-CM

## 2020-05-02 DIAGNOSIS — K56609 Unspecified intestinal obstruction, unspecified as to partial versus complete obstruction: Secondary | ICD-10-CM | POA: Diagnosis not present

## 2020-05-02 DIAGNOSIS — K7460 Unspecified cirrhosis of liver: Secondary | ICD-10-CM | POA: Diagnosis present

## 2020-05-02 DIAGNOSIS — K3184 Gastroparesis: Secondary | ICD-10-CM | POA: Diagnosis not present

## 2020-05-02 DIAGNOSIS — F319 Bipolar disorder, unspecified: Secondary | ICD-10-CM | POA: Diagnosis not present

## 2020-05-02 DIAGNOSIS — K7581 Nonalcoholic steatohepatitis (NASH): Secondary | ICD-10-CM | POA: Diagnosis not present

## 2020-05-02 DIAGNOSIS — Z79899 Other long term (current) drug therapy: Secondary | ICD-10-CM

## 2020-05-02 DIAGNOSIS — D649 Anemia, unspecified: Secondary | ICD-10-CM

## 2020-05-02 DIAGNOSIS — W1830XA Fall on same level, unspecified, initial encounter: Secondary | ICD-10-CM | POA: Diagnosis present

## 2020-05-02 DIAGNOSIS — N186 End stage renal disease: Secondary | ICD-10-CM | POA: Diagnosis not present

## 2020-05-02 DIAGNOSIS — K920 Hematemesis: Secondary | ICD-10-CM | POA: Diagnosis present

## 2020-05-02 DIAGNOSIS — M5412 Radiculopathy, cervical region: Secondary | ICD-10-CM | POA: Diagnosis present

## 2020-05-02 DIAGNOSIS — D62 Acute posthemorrhagic anemia: Secondary | ICD-10-CM | POA: Diagnosis not present

## 2020-05-02 DIAGNOSIS — S42342A Displaced spiral fracture of shaft of humerus, left arm, initial encounter for closed fracture: Secondary | ICD-10-CM | POA: Diagnosis present

## 2020-05-02 DIAGNOSIS — E785 Hyperlipidemia, unspecified: Secondary | ICD-10-CM | POA: Diagnosis not present

## 2020-05-02 DIAGNOSIS — S065X9A Traumatic subdural hemorrhage with loss of consciousness of unspecified duration, initial encounter: Secondary | ICD-10-CM

## 2020-05-02 DIAGNOSIS — E1143 Type 2 diabetes mellitus with diabetic autonomic (poly)neuropathy: Secondary | ICD-10-CM | POA: Diagnosis not present

## 2020-05-02 DIAGNOSIS — E1122 Type 2 diabetes mellitus with diabetic chronic kidney disease: Secondary | ICD-10-CM | POA: Diagnosis not present

## 2020-05-02 DIAGNOSIS — Z885 Allergy status to narcotic agent status: Secondary | ICD-10-CM

## 2020-05-02 DIAGNOSIS — Z23 Encounter for immunization: Secondary | ICD-10-CM

## 2020-05-02 DIAGNOSIS — Z9181 History of falling: Secondary | ICD-10-CM

## 2020-05-02 DIAGNOSIS — W19XXXA Unspecified fall, initial encounter: Secondary | ICD-10-CM

## 2020-05-02 DIAGNOSIS — R296 Repeated falls: Secondary | ICD-10-CM | POA: Diagnosis not present

## 2020-05-02 DIAGNOSIS — I6201 Nontraumatic acute subdural hemorrhage: Secondary | ICD-10-CM | POA: Diagnosis not present

## 2020-05-02 DIAGNOSIS — Z882 Allergy status to sulfonamides status: Secondary | ICD-10-CM

## 2020-05-02 DIAGNOSIS — K219 Gastro-esophageal reflux disease without esophagitis: Secondary | ICD-10-CM | POA: Diagnosis not present

## 2020-05-02 DIAGNOSIS — S0990XA Unspecified injury of head, initial encounter: Secondary | ICD-10-CM

## 2020-05-02 DIAGNOSIS — Z888 Allergy status to other drugs, medicaments and biological substances status: Secondary | ICD-10-CM

## 2020-05-02 DIAGNOSIS — Z794 Long term (current) use of insulin: Secondary | ICD-10-CM

## 2020-05-02 DIAGNOSIS — G4733 Obstructive sleep apnea (adult) (pediatric): Secondary | ICD-10-CM | POA: Diagnosis present

## 2020-05-02 DIAGNOSIS — S42309A Unspecified fracture of shaft of humerus, unspecified arm, initial encounter for closed fracture: Secondary | ICD-10-CM | POA: Diagnosis present

## 2020-05-02 DIAGNOSIS — Z419 Encounter for procedure for purposes other than remedying health state, unspecified: Secondary | ICD-10-CM

## 2020-05-02 DIAGNOSIS — R109 Unspecified abdominal pain: Secondary | ICD-10-CM

## 2020-05-02 HISTORY — DX: Chronic kidney disease, unspecified: N18.9

## 2020-05-02 LAB — CBC WITH DIFFERENTIAL/PLATELET
Abs Immature Granulocytes: 0.07 10*3/uL (ref 0.00–0.07)
Basophils Absolute: 0.1 10*3/uL (ref 0.0–0.1)
Basophils Relative: 1 %
Eosinophils Absolute: 0.3 10*3/uL (ref 0.0–0.5)
Eosinophils Relative: 3 %
HCT: 24.4 % — ABNORMAL LOW (ref 36.0–46.0)
Hemoglobin: 7.7 g/dL — ABNORMAL LOW (ref 12.0–15.0)
Immature Granulocytes: 1 %
Lymphocytes Relative: 15 %
Lymphs Abs: 1.2 10*3/uL (ref 0.7–4.0)
MCH: 32.8 pg (ref 26.0–34.0)
MCHC: 31.6 g/dL (ref 30.0–36.0)
MCV: 103.8 fL — ABNORMAL HIGH (ref 80.0–100.0)
Monocytes Absolute: 0.6 10*3/uL (ref 0.1–1.0)
Monocytes Relative: 7 %
Neutro Abs: 5.7 10*3/uL (ref 1.7–7.7)
Neutrophils Relative %: 73 %
Platelets: 154 10*3/uL (ref 150–400)
RBC: 2.35 MIL/uL — ABNORMAL LOW (ref 3.87–5.11)
RDW: 14.4 % (ref 11.5–15.5)
WBC: 7.9 10*3/uL (ref 4.0–10.5)
nRBC: 0.3 % — ABNORMAL HIGH (ref 0.0–0.2)

## 2020-05-02 LAB — COMPREHENSIVE METABOLIC PANEL
ALT: 17 U/L (ref 0–44)
AST: 29 U/L (ref 15–41)
Albumin: 3.6 g/dL (ref 3.5–5.0)
Alkaline Phosphatase: 60 U/L (ref 38–126)
Anion gap: 12 (ref 5–15)
BUN: 48 mg/dL — ABNORMAL HIGH (ref 8–23)
CO2: 22 mmol/L (ref 22–32)
Calcium: 8.2 mg/dL — ABNORMAL LOW (ref 8.9–10.3)
Chloride: 103 mmol/L (ref 98–111)
Creatinine, Ser: 3.28 mg/dL — ABNORMAL HIGH (ref 0.44–1.00)
GFR calc Af Amer: 16 mL/min — ABNORMAL LOW (ref >60)
GFR calc non Af Amer: 14 mL/min — ABNORMAL LOW (ref >60)
Glucose, Bld: 127 mg/dL — ABNORMAL HIGH (ref 70–99)
Potassium: 4.3 mmol/L (ref 3.5–5.1)
Sodium: 137 mmol/L (ref 135–145)
Total Bilirubin: 0.7 mg/dL (ref 0.3–1.2)
Total Protein: 6.7 g/dL (ref 6.5–8.1)

## 2020-05-02 LAB — URINALYSIS, ROUTINE W REFLEX MICROSCOPIC
Bilirubin Urine: NEGATIVE
Glucose, UA: NEGATIVE mg/dL
Hgb urine dipstick: NEGATIVE
Ketones, ur: NEGATIVE mg/dL
Leukocytes,Ua: NEGATIVE
Nitrite: NEGATIVE
Protein, ur: NEGATIVE mg/dL
Specific Gravity, Urine: <1.005 — ABNORMAL LOW (ref 1.005–1.030)
pH: 5 (ref 5.0–8.0)

## 2020-05-02 LAB — PROTIME-INR
INR: 1.1 (ref 0.8–1.2)
Prothrombin Time: 13.5 seconds (ref 11.4–15.2)

## 2020-05-02 LAB — SARS CORONAVIRUS 2 BY RT PCR (HOSPITAL ORDER, PERFORMED IN ~~LOC~~ HOSPITAL LAB): SARS Coronavirus 2: NEGATIVE

## 2020-05-02 MED ORDER — LIDOCAINE-EPINEPHRINE (PF) 2 %-1:200000 IJ SOLN: 10.0000 mL | Freq: Once | INTRAMUSCULAR | Status: DC

## 2020-05-02 MED ORDER — LIDOCAINE-EPINEPHRINE (PF) 1 %-1:200000 IJ SOLN
INTRAMUSCULAR | Status: AC
  Administered 2020-05-02: 30 mL
  Filled 2020-05-02: qty 30

## 2020-05-02 MED ORDER — LIDOCAINE-EPINEPHRINE-TETRACAINE (LET) TOPICAL GEL
3.0000 mL | Freq: Once | TOPICAL | Status: AC
  Administered 2020-05-02: 3 mL via TOPICAL
  Filled 2020-05-02: qty 3

## 2020-05-02 MED ORDER — FENTANYL CITRATE (PF) 100 MCG/2ML IJ SOLN
50.0000 ug | Freq: Once | INTRAMUSCULAR | Status: AC
  Administered 2020-05-02: 50 ug via INTRAVENOUS
  Filled 2020-05-02: qty 2

## 2020-05-02 MED ORDER — TETANUS-DIPHTH-ACELL PERTUSSIS 5-2.5-18.5 LF-MCG/0.5 IM SUSP
0.5000 mL | Freq: Once | INTRAMUSCULAR | Status: AC
  Administered 2020-05-02: 0.5 mL via INTRAMUSCULAR
  Filled 2020-05-02: qty 0.5

## 2020-05-02 NOTE — ED Triage Notes (Signed)
Pt states she tripped/fell ~45 min PTA-states head hit a plaster wall/baseboard-no LOC-NAD-to triage in w/c

## 2020-05-02 NOTE — ED Notes (Signed)
[facility] notified (Tammy) - patient ready for transport 

## 2020-05-02 NOTE — ED Notes (Signed)
Move to room

## 2020-05-02 NOTE — ED Provider Notes (Signed)
Received patient at signout from [facility]  Refer to provider note for full history and physical examination.  Briefly patient is a 64 year old female with history of cirrhosis of the liver, CKD, diabetes mellitus, gastroparesis, hyperlipidemia, migraine headaches presenting for evaluation after a fall.  Reports that she fell into the drywall, striking her head and right shoulder and then sliding down onto her buttocks.  Denies loss of consciousness.  States that she has had increased falls ever since she was evaluated in the ED on 04/25/2020 after a fall where she was diagnosed with a spiral fracture of the left humeral shaft.  Has not been able to follow-up with orthopedics outpatient yet.  Was discharged with hydrocodone.  Endorses 4-5 falls daily, denies any prodrome leading up to the falls but states that she just "gives out and falls".  Pending imaging for further evaluation.  If imaging is reassuring, plan to ambulate the patient to determine candidacy for discharge home with close outpatient follow-up.  Also of note, patient had AV fistula placed in preparation for starting dialysis0.    Physical Exam  BP (!) 118/34 (BP Location: Right Arm)   Pulse 77   Temp 98 F (36.7 C) (Oral)   Resp 18   SpO2 99%   Physical Exam Vitals and nursing note reviewed.  Constitutional:      General: She is not in acute distress.    Appearance: She is well-developed.  HENT:     Head: Normocephalic.     Comments: 4 cm semilunar scalp laceration to the right parietal scalp.  No crepitus or deformity.  Some ecchymosis noted to the bridge of the nose.  No raccoon eyes, battle sign, or CSF rhinorrhea noted. Eyes:     General:        Right eye: No discharge.        Left eye: No discharge.     Conjunctiva/sclera: Conjunctivae normal.  Neck:     Vascular: No JVD.     Trachea: No tracheal deviation.  Cardiovascular:     Rate and Rhythm: Normal rate.  Pulmonary:     Effort: Pulmonary effort is normal.      Comments: Marked ecchymosis noted extending from the left shoulder across the chest wall and bilateral breasts. Abdominal:     General: There is no distension.  Musculoskeletal:     Cervical back: Neck supple.     Comments: Wearing left shoulder sling  Skin:    General: Skin is warm.     Findings: No erythema.  Neurological:     Mental Status: She is alert.  Psychiatric:        Behavior: Behavior normal.     ED Course/Procedures   Clinical Course as of May 02 2048  Mon Aug 16, 10974  [id number] 64 year old female with recent fall and broken arm here after another fall in which she hit her head into a wall.  Scalp laceration.  Complaining of worsening arm pain due to being out of her pain medicine.  CT imaging of her head max face cervical spine chest x-ray and right shoulder done.  Radiology called me saying that she is got bilateral tentorial subdurals.  Patient is awake and alert.  Will need to review with neurosurgery once we see the reports in.   [MB]  1934 Reviewed case with Dr. Annette Stable Neurosurgery and he has been able to independently review the images.  He felt that if she had reliable family she can be discharged as these  do not tend to get larger.  If she is not in a safe environment she would need to be observed for period of 8 hours and if there was any worsening her condition to reconsult him.   [MB]    Clinical Course User Index [MB] Hayden Rasmussen, MD    ..Laceration Repair  Date/Time: 05/02/2020 10:27 PM Performed by: Renita Papa, PA-C Authorized by: Renita Papa, PA-C   Consent:    Consent obtained:  Verbal   Consent given by:  Patient   Risks discussed:  Infection, need for additional repair, pain, poor cosmetic result and poor wound healing   Alternatives discussed:  No treatment and delayed treatment Universal protocol:    Procedure explained and questions answered to patient or proxy's satisfaction: yes     Relevant documents present and verified: yes      Test results available and properly labeled: yes     Imaging studies available: yes     Required blood products, implants, devices, and special equipment available: yes     Site/side marked: yes     Immediately prior to procedure, a time out was called: yes     Patient identity confirmed:  Verbally with patient Anesthesia (see MAR for exact dosages):    Anesthesia method:  Local infiltration and topical application   Topical anesthetic:  LET   Local anesthetic:  Lidocaine 2% WITH epi Laceration details:    Location:  Scalp   Scalp location:  R parietal   Length (cm):  4   Depth (mm):  2 Repair type:    Repair type:  Intermediate Pre-procedure details:    Preparation:  Patient was prepped and draped in usual sterile fashion and imaging obtained to evaluate for foreign bodies Exploration:    Hemostasis achieved with:  Direct pressure   Wound exploration: wound explored through full range of motion     Wound extent: areolar tissue violated     Wound extent: no underlying fracture noted   Treatment:    Area cleansed with:  Betadine and saline   Irrigation method:  Syringe Skin repair:    Repair method:  Staples   Number of staples:  6 Approximation:    Approximation:  Close Post-procedure details:    Dressing:  Open (no dressing)   Patient tolerance of procedure:  Tolerated well, no immediate complications    MDM  DG Chest 2 View  Result Date: 05/02/2020 CLINICAL DATA:  [place], recent left humeral fracture EXAM: CHEST - 2 VIEW COMPARISON:  04/25/2020 FINDINGS: Frontal and lateral views of the chest demonstrate displaced comminuted left humeral diaphyseal fracture, with increased displacement since prior study. Cardiac silhouette is unremarkable. No airspace disease, effusion, or pneumothorax. No new fractures. IMPRESSION: 1. Known displaced left humeral diaphyseal fracture, partially imaged on this study. Increased displacement since prior exam. 2. Otherwise no acute intrathoracic  process. Electronically Signed   By: Randa Ngo M.D.   On: 05/02/2020 19:25   DG Shoulder Right  Result Date: 05/02/2020 CLINICAL DATA:  Right shoulder pain after fall EXAM: RIGHT SHOULDER - 2+ VIEW COMPARISON:  04/18/2020 FINDINGS: Frontal, transscapular, and axillary views of the right shoulder demonstrate no fracture, subluxation, or dislocation. Mild acromioclavicular and glenohumeral joint osteoarthritis. Right chest is clear. Soft tissues are normal. IMPRESSION: 1. Osteoarthritis.  No acute fracture. Electronically Signed   By: Randa Ngo M.D.   On: 05/02/2020 19:18   CT Head Wo Contrast  Result Date: 05/02/2020 CLINICAL DATA:  Fall.  Hit head on plaster wall. EXAM: CT HEAD WITHOUT CONTRAST CT MAXILLOFACIAL WITHOUT CONTRAST CT CERVICAL SPINE WITHOUT CONTRAST TECHNIQUE: Multidetector CT imaging of the head, cervical spine, and maxillofacial structures were performed using the standard protocol without intravenous contrast. Multiplanar CT image reconstructions of the cervical spine and maxillofacial structures were also generated. COMPARISON:  CT head 01/21/2020 CT maxillofacial 08/04/2018. FINDINGS: CT HEAD FINDINGS Brain: There are acute bilateral subdural hematomas which extend along the tentorium, right greater than left. On the right this measures up to 6 mm in thickness. On the left this measures approximately 3 mm in maximum thickness. No signs of acute brain infarct or mass. Ventricular volumes are within normal limits. Vascular: No hyperdense vessel or unexpected calcification. Skull: Normal. Negative for fracture or focal lesion. Other: Right posterior parietal scalp laceration and hematoma is noted, image 62/3. CT MAXILLOFACIAL FINDINGS Osseous: Nondisplaced bilateral nasal bone fractures, image 48/3. No additional fractures identified. Orbits: Negative. No traumatic or inflammatory finding. Sinuses: Clear Soft tissues: Negative. CT CERVICAL SPINE FINDINGS Alignment: Alignment of the  cervical spine appears normal. The facet joints appear well aligned. Skull base and vertebrae: No acute fracture. No primary bone lesion or focal pathologic process. Soft tissues and spinal canal: No prevertebral fluid or swelling. No visible canal hematoma. Disc levels:  Within normal limits. Upper chest: Negative. Other: None IMPRESSION: 1. Acute bilateral subdural hematomas which extend along the tentorium, right greater than left. 2. Right posterior parietal scalp laceration and hematoma. 3. Nondisplaced bilateral nasal bone fractures. 4. No evidence for cervical spine fracture or dislocation. 5. Critical Value/emergent results were called by telephone at the time of interpretation on 05/02/2020 at 7:11 pm to provider Dr. Melina Copa, Who verbally acknowledged these results. Electronically Signed   By: Kerby Moors M.D.   On: 05/02/2020 19:12   CT Cervical Spine Wo Contrast  Result Date: 05/02/2020 CLINICAL DATA:  Fall.  Hit head on plaster wall. EXAM: CT HEAD WITHOUT CONTRAST CT MAXILLOFACIAL WITHOUT CONTRAST CT CERVICAL SPINE WITHOUT CONTRAST TECHNIQUE: Multidetector CT imaging of the head, cervical spine, and maxillofacial structures were performed using the standard protocol without intravenous contrast. Multiplanar CT image reconstructions of the cervical spine and maxillofacial structures were also generated. COMPARISON:  CT head 01/21/2020 CT maxillofacial 08/04/2018. FINDINGS: CT HEAD FINDINGS Brain: There are acute bilateral subdural hematomas which extend along the tentorium, right greater than left. On the right this measures up to 6 mm in thickness. On the left this measures approximately 3 mm in maximum thickness. No signs of acute brain infarct or mass. Ventricular volumes are within normal limits. Vascular: No hyperdense vessel or unexpected calcification. Skull: Normal. Negative for fracture or focal lesion. Other: Right posterior parietal scalp laceration and hematoma is noted, image 62/3. CT  MAXILLOFACIAL FINDINGS Osseous: Nondisplaced bilateral nasal bone fractures, image 48/3. No additional fractures identified. Orbits: Negative. No traumatic or inflammatory finding. Sinuses: Clear Soft tissues: Negative. CT CERVICAL SPINE FINDINGS Alignment: Alignment of the cervical spine appears normal. The facet joints appear well aligned. Skull base and vertebrae: No acute fracture. No primary bone lesion or focal pathologic process. Soft tissues and spinal canal: No prevertebral fluid or swelling. No visible canal hematoma. Disc levels:  Within normal limits. Upper chest: Negative. Other: None IMPRESSION: 1. Acute bilateral subdural hematomas which extend along the tentorium, right greater than left. 2. Right posterior parietal scalp laceration and hematoma. 3. Nondisplaced bilateral nasal bone fractures. 4. No evidence for cervical spine fracture or dislocation. 5. Critical Value/emergent results were  called by telephone at the time of interpretation on 05/02/2020 at 7:11 pm to provider Dr. Melina Copa, Who verbally acknowledged these results. Electronically Signed   By: Kerby Moors M.D.   On: 05/02/2020 19:12   CT Maxillofacial WO CM  Result Date: 05/02/2020 CLINICAL DATA:  Fall.  Hit head on plaster wall. EXAM: CT HEAD WITHOUT CONTRAST CT MAXILLOFACIAL WITHOUT CONTRAST CT CERVICAL SPINE WITHOUT CONTRAST TECHNIQUE: Multidetector CT imaging of the head, cervical spine, and maxillofacial structures were performed using the standard protocol without intravenous contrast. Multiplanar CT image reconstructions of the cervical spine and maxillofacial structures were also generated. COMPARISON:  CT head 01/21/2020 CT maxillofacial 08/04/2018. FINDINGS: CT HEAD FINDINGS Brain: There are acute bilateral subdural hematomas which extend along the tentorium, right greater than left. On the right this measures up to 6 mm in thickness. On the left this measures approximately 3 mm in maximum thickness. No signs of acute  brain infarct or mass. Ventricular volumes are within normal limits. Vascular: No hyperdense vessel or unexpected calcification. Skull: Normal. Negative for fracture or focal lesion. Other: Right posterior parietal scalp laceration and hematoma is noted, image 62/3. CT MAXILLOFACIAL FINDINGS Osseous: Nondisplaced bilateral nasal bone fractures, image 48/3. No additional fractures identified. Orbits: Negative. No traumatic or inflammatory finding. Sinuses: Clear Soft tissues: Negative. CT CERVICAL SPINE FINDINGS Alignment: Alignment of the cervical spine appears normal. The facet joints appear well aligned. Skull base and vertebrae: No acute fracture. No primary bone lesion or focal pathologic process. Soft tissues and spinal canal: No prevertebral fluid or swelling. No visible canal hematoma. Disc levels:  Within normal limits. Upper chest: Negative. Other: None IMPRESSION: 1. Acute bilateral subdural hematomas which extend along the tentorium, right greater than left. 2. Right posterior parietal scalp laceration and hematoma. 3. Nondisplaced bilateral nasal bone fractures. 4. No evidence for cervical spine fracture or dislocation. 5. Critical Value/emergent results were called by telephone at the time of interpretation on 05/02/2020 at 7:11 pm to provider Dr. Melina Copa, Who verbally acknowledged these results. Electronically Signed   By: Kerby Moors M.D.   On: 05/02/2020 19:12    Labs Reviewed  COMPREHENSIVE METABOLIC PANEL - Abnormal; Notable for the following components:      Result Value   Glucose, Bld 127 (*)    BUN 48 (*)    Creatinine, Ser 3.28 (*)    Calcium 8.2 (*)    GFR calc non Af Amer 14 (*)    GFR calc Af Amer 16 (*)    All other components within normal limits  CBC WITH DIFFERENTIAL/PLATELET - Abnormal; Notable for the following components:   RBC 2.35 (*)    Hemoglobin 7.7 (*)    HCT 24.4 (*)    MCV 103.8 (*)    nRBC 0.3 (*)    All other components within normal limits  SARS  CORONAVIRUS 2 BY RT PCR (HOSPITAL ORDER, [place] [facility])  PROTIME-INR  URINALYSIS, ROUTINE W REFLEX MICROSCOPIC    Imaging shows acute bilateral subdural hematomas which extend along the tentorium, right greater than left.  She has nondisplaced bilateral nasal bone fractures which will likely not require any ENT intervention.  No cervical spine fractures noted.  Dr. Melina Copa spoke with Dr. Trenton Gammon with neurosurgery who notes that the subdural hematomas are rather small.  He recommends neuro checks and if the patient has assistance at home she could potentially be discharged home.  The patient lives by herself and helps her elderly father.  With  increased frequency of falls, do not feel that she is safe to go home at this time.  Will likely require PT OT eval.  Additionally, lab work reviewed and interpreted by myself shows worsening anemia with hemoglobin of 7.7, down around 3 g from blood work that was done a few months ago.  Her renal insufficiency is at her baseline.  LFTs are within normal limits.  UA is not concerning for UTI or nephrolithiasis.  On reevaluation patient is resting comfortably in no distress.  She is alert and oriented, no focal neurologic deficits noted.  8:45PM Spoke with Dr. Cyd Silence with [facility] hospitalist service who agrees to assume care of patient and bring her into the hospital for further evaluation and management.  She will be transferred to [facility] for admission with neuro checks and likely repeat imaging in the morning to ensure stability of the intracranial hemorrhages.  If the patient were to board in our department throughout the night he does recommend rechecking a CBC as well as obtaining a type and screen to ensure the patient does not have worsening anemia.  If there is concern for GI bleed, he recommends obtaining a Hemoccult.  The patient denies melena or hematochezia.  On exam she has significant ecchymosis to the chest wall  extending to the bilateral breasts from her recent fall around 1 week ago.  Suspect this could explain the drop in her hemoglobin but will continue to monitor.  Patient underwent repair of scalp laceration with staples.  She tolerated this without difficulty.  She is agreeable to admission.  She request placement of a Foley catheter as she self caths at home.      Debroah Baller 05/02/20 [id number]    Hayden Rasmussen, MD 05/03/20 1029

## 2020-05-02 NOTE — ED Provider Notes (Signed)
[facility]ER[facility]ENC[facility] DEPARTMENT Provider Note   CSN: [id number] Arrival date & time: 05/02/20  1740     History Chief Complaint  Patient presents with   Fall    Tina Ramos is a 64 y.o. female.  HPI   Patient presents to the emergency department after suffering a fall while she was in her kitchen.  She states she was at the refrigerator and started to fall hitting the drywall with her head and right shoulder then sliding down onto her butt.  She states denies losing consciousness, is not on anticoags and can remember all events happening before and after.  She is unsure how she fell, she denies becoming dizzy, the room coming black, becoming off balance, headache change in vision, weakness, numbness or tingling in her extremities.  Patient was recently seen at the emergency department on 0809 after suffering a mechanical fall where she broke her left humerus.  She is currently in a sling and was prescribed Norco for pain management.  She states she has had multiple falls since then states she is fallen 4-5 times over the last few days.  She denies headache, dizziness, off-balance, paresthesias in arms or extremities, weakness, slurred speech.  She has significant medical history of cirrhosis, diabetes, gastroparesis, migraines.  She denies headache, fever, chills, shortness of breath, chest pain, abdominal pain, dysuria, pedal edema.  Past Medical History:  Diagnosis Date   Cirrhosis ([place])    Cirrhosis ([place])    Diabetes mellitus without complication ([place])    Gastroparesis    Hyperlipemia    Interstitial cystitis    Migraine    UTI (lower urinary tract infection)     There are no problems to display for this patient.   Past Surgical History:  Procedure Laterality Date   ABDOMINAL HYSTERECTOMY     ABDOMINAL SURGERY     APPENDECTOMY     ESOPHAGEAL DILATION     FINGER SURGERY Left    TOE SURGERY       OB History   No obstetric history on  file.     No family history on file.  Social History   Tobacco Use   Smoking status: Never Smoker   Smokeless tobacco: Never Used  Vaping Use   Vaping Use: Never used  Substance Use Topics   Alcohol use: No   Drug use: No    Home Medications Prior to Admission medications   Medication Sig Start Date End Date Taking? Authorizing Provider  amitriptyline (ELAVIL) 50 MG tablet Take 50 mg by mouth at bedtime.    [provider]  aspirin EC 81 MG tablet Take 81 mg by mouth daily.    [provider]  atorvastatin (LIPITOR) 20 MG tablet Take 20 mg by mouth daily.    [provider]  Atorvastatin Calcium (LIPITOR PO) Take by mouth.    [provider]  buPROPion (WELLBUTRIN SR) 100 MG 12 hr tablet Take 200 mg by mouth 2 (two) times daily.    [provider]  buPROPion (WELLBUTRIN SR) 150 MG 12 hr tablet Take 150 mg by mouth 2 (two) times daily.    [provider]  buPROPion (WELLBUTRIN) 100 MG tablet Take 100 mg by mouth 2 (two) times daily.    [provider]  calcitRIOL (ROCALTROL) 0.5 MCG capsule Take 0.5 mcg by mouth daily.    [provider]  calcium carbonate (OSCAL) 1500 (600 Ca) MG TABS tablet Take 1,500 mg by mouth 2 (two)  times daily with a meal.    [provider]  clonazePAM (KLONOPIN) 0.5 MG tablet Take 0.5 mg by mouth as directed. .5mg  tablet 1-2 tablets in am, 1 tablet in afternoon, then 1 tablet at bedtime    [provider]  estradiol (ESTRACE) 1 MG tablet Take 1 mg by mouth daily.    [provider]  ferrous sulfate 325 (65 FE) MG tablet Take 325 mg by mouth daily with breakfast.    [provider]  fexofenadine (ALLEGRA) 60 MG tablet Take 1 tablet (60 mg total) by mouth 2 (two) times daily. 01/23/14   Malvin Johns, MD  gabapentin (NEURONTIN) 100 MG capsule Take 100 mg by mouth 3 (three) times daily.    [provider]  HYDROcodone-acetaminophen  (NORCO/VICODIN) 5-325 MG tablet Take 1-2 tablets by mouth every 6 (six) hours as needed. 04/26/20   Horton, Barbette Hair, MD  ibuprofen (ADVIL,MOTRIN) 600 MG tablet Take 1 tablet (600 mg total) by mouth every 6 (six) hours as needed. 11/27/14   Varney Biles, MD  insulin detemir (LEVEMIR) 100 UNIT/ML injection Inject into the skin daily.    [provider]  insulin glargine (LANTUS) 100 UNIT/ML injection Inject into the skin at bedtime.    [provider]  lamoTRIgine (LAMICTAL) 100 MG tablet Take 100 mg by mouth 2 (two) times daily.    [provider]  lamoTRIgine (LAMICTAL) 25 MG tablet Take 25 mg by mouth daily.    [provider]  metoprolol succinate (TOPROL-XL) 25 MG 24 hr tablet Take 25 mg by mouth daily.    [provider]  mirtazapine (REMERON) 15 MG tablet Take 15 mg by mouth at bedtime.    [provider]  mupirocin cream (BACTROBAN) 2 % Apply 1 application topically 2 (two) times daily. For 7 days 03/17/15   Malvin Johns, MD  ondansetron (ZOFRAN ODT) 4 MG disintegrating tablet 4mg  ODT q4 hours prn nausea/vomit 10/02/18   Deno Etienne, DO  oxybutynin (DITROPAN-XL) 10 MG 24 hr tablet Take 10 mg by mouth at bedtime.    [provider]  pantoprazole (PROTONIX) 40 MG tablet Take 40 mg by mouth daily.    [provider]  pravastatin (PRAVACHOL) 20 MG tablet Take 20 mg by mouth daily.    [provider]  pregabalin (LYRICA) 150 MG capsule Take 150 mg by mouth 2 (two) times daily.    [provider]  promethazine (PHENERGAN) 25 MG tablet Take 1 tablet (25 mg total) by mouth every 6 (six) hours as needed for nausea. 11/27/14   Varney Biles, MD  QUEtiapine (SEROQUEL) 300 MG tablet Take 300 mg by mouth at bedtime.    [provider]  QUEtiapine Fumarate (SEROQUEL PO) Take by mouth.    [provider]  rizatriptan (MAXALT) 10 MG tablet Take 10 mg by mouth as needed for migraine. May repeat in 2  hours if needed    [provider]  sevelamer carbonate (RENVELA) 800 MG tablet Take 800 mg by mouth 3 (three) times daily with meals.    [provider]  sitaGLIPtin-metformin (JANUMET) 50-1000 MG per tablet Take 1 tablet by mouth 2 (two) times daily with a meal.    [provider]  tiZANidine (ZANAFLEX) 4 MG tablet Take 4 mg by mouth every 6 (six) hours as needed for muscle spasms.    [provider]    Allergies    Darvon [propoxyphene], Sulfa antibiotics, Talwin [pentazocine], and Latex  Review  of Systems   Review of Systems  Constitutional: Negative for chills and fever.  HENT: Negative for congestion, tinnitus and trouble swallowing.   Eyes: Negative for visual disturbance.  Respiratory: Negative for shortness of breath.   Cardiovascular: Negative for chest pain.  Gastrointestinal: Negative for abdominal pain, diarrhea, nausea and vomiting.  Genitourinary: Negative for enuresis, flank pain and frequency.  Musculoskeletal: Negative for back pain.       Admits to right shoulder pain.  Skin: Negative for rash.  Neurological: Negative for dizziness, light-headedness, numbness and headaches.  Hematological: Does not bruise/bleed easily.    Physical Exam Updated Vital Signs BP (!) 118/34 (BP Location: Right Arm)    Pulse 77    Temp 98 F (36.7 C) (Oral)    Resp 18    SpO2 99%   Physical Exam Vitals and nursing note reviewed.  Constitutional:      General: She is not in acute distress.    Appearance: Normal appearance. She is not ill-appearing or diaphoretic.  HENT:     Head: Normocephalic and atraumatic.     Comments: Patient has a large hematoma on the right occipital lobe.    Nose: No congestion or rhinorrhea.     Mouth/Throat:     Mouth: Mucous membranes are moist.     Pharynx: Oropharynx is clear.  Eyes:     General: No visual field deficit or scleral icterus.    Conjunctiva/sclera: Conjunctivae normal.     Pupils: Pupils are equal,  round, and reactive to light.  Cardiovascular:     Rate and Rhythm: Normal rate and regular rhythm.     Pulses: Normal pulses.     Heart sounds: No murmur heard.  No friction rub. No gallop.   Pulmonary:     Effort: Pulmonary effort is normal. No respiratory distress.     Breath sounds: No wheezing, rhonchi or rales.  Abdominal:     General: There is no distension.     Palpations: Abdomen is soft.     Tenderness: There is no abdominal tenderness. There is no guarding.  Musculoskeletal:        General: No swelling or tenderness.     Right lower leg: No edema.     Left lower leg: No edema.     Comments: Spine was evaluated, no gross abnormalities noted, no ecchymosis or lacerations noted.  Patient had slight tender to palpation along her cervical spine, thoracic and lumbar spine had no tenderness, no step-off noted.  Skin:    General: Skin is warm and dry.     Capillary Refill: Capillary refill takes less than 2 seconds.     Findings: No rash.     Comments: Patient has a number of abrasions seen on her hands, arms, legs from previous falls.  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  Neurovascular fully intact.  Neurological:     General: No focal deficit present.     Mental Status: She is alert and oriented to person, place, and time.     GCS: GCS eye subscore is 4. GCS verbal subscore is 5. GCS motor subscore is 6.     Cranial Nerves: Cranial nerves are intact. No cranial nerve deficit or facial asymmetry.     Sensory: Sensation is intact. No sensory deficit.     Motor: Motor function is intact. No weakness or pronator drift.  Coordination: Coordination is intact. Romberg sign negative. Finger-Nose-Finger Test and Heel to [facility] Test normal.  Psychiatric:        Mood and Affect: Mood normal.     ED Results / Procedures / Treatments   Labs (all labs ordered are listed, but  only abnormal results are displayed) Labs Reviewed  COMPREHENSIVE METABOLIC PANEL  CBC WITH DIFFERENTIAL/PLATELET  URINALYSIS, ROUTINE W REFLEX MICROSCOPIC    EKG None  Radiology No results found.  Procedures Procedures (including critical care time)  Medications Ordered in ED Medications  Tdap (BOOSTRIX) injection 0.5 mL (has no administration in time range)    ED Course  I have reviewed the triage vital signs and the nursing notes.  Pertinent labs & imaging results that were available during my care of the patient were reviewed by me and considered in my medical decision making (see chart for details).    MDM Rules/Calculators/A&P                          I have personally reviewed all imaging, labs and have interpreted them.  Patient did not appear to be in acute distress, vital signs reassuring patient was alert and oriented.  Patient did not show focal deficits, patient had large hematoma on the upper right occipital lobe.  She had slight tenderness to palpation along her cervical spine, no tenderness along her thoracic or lumbar spine no step-off felt.  Lung sounds were clear bilaterally, no pedal edema noted.  Patient left arm neurovascular intact, palpable thrill.  Will send patient for CT scan of her head, spine, maxillofacial, x-ray of her right shoulder and obtain screening labs.  Due to shift change patient will be handed over to [facility].  She was provided HPI, current work-up and possible disposition.  Pending lab work and imaging, if all come back unremarkable recommend walking the patient for further evaluation of neuro status.  Recommend follow-up with orthopedics for her left humerus fracture as well as transition of care consults to obtain OT/PT help at home with outpatient neuro follow-up.  if she is unsteady recommend neurology consult and/or possible transfer to [facility] for observation with PT/ OT evaluation. Final Clinical Impression(s) / ED  Diagnoses Final diagnoses:  Fall, initial encounter  Injury of head, initial encounter    Rx / DC Orders ED Discharge Orders    None       Aron Baba 05/02/20 1907    Hayden Rasmussen, MD 05/03/20 1026

## 2020-05-03 ENCOUNTER — Observation Stay (HOSPITAL_COMMUNITY): Payer: Medicare Other

## 2020-05-03 ENCOUNTER — Encounter (HOSPITAL_COMMUNITY): Payer: Self-pay | Admitting: Internal Medicine

## 2020-05-03 DIAGNOSIS — S42342A Displaced spiral fracture of shaft of humerus, left arm, initial encounter for closed fracture: Secondary | ICD-10-CM | POA: Diagnosis present

## 2020-05-03 DIAGNOSIS — R55 Syncope and collapse: Secondary | ICD-10-CM

## 2020-05-03 DIAGNOSIS — K3184 Gastroparesis: Secondary | ICD-10-CM | POA: Diagnosis present

## 2020-05-03 DIAGNOSIS — S42309A Unspecified fracture of shaft of humerus, unspecified arm, initial encounter for closed fracture: Secondary | ICD-10-CM | POA: Diagnosis present

## 2020-05-03 DIAGNOSIS — S022XXA Fracture of nasal bones, initial encounter for closed fracture: Secondary | ICD-10-CM | POA: Diagnosis present

## 2020-05-03 DIAGNOSIS — E1122 Type 2 diabetes mellitus with diabetic chronic kidney disease: Secondary | ICD-10-CM | POA: Diagnosis present

## 2020-05-03 DIAGNOSIS — E1143 Type 2 diabetes mellitus with diabetic autonomic (poly)neuropathy: Secondary | ICD-10-CM | POA: Diagnosis present

## 2020-05-03 DIAGNOSIS — R569 Unspecified convulsions: Secondary | ICD-10-CM | POA: Diagnosis not present

## 2020-05-03 DIAGNOSIS — Z23 Encounter for immunization: Secondary | ICD-10-CM | POA: Diagnosis present

## 2020-05-03 DIAGNOSIS — K219 Gastro-esophageal reflux disease without esophagitis: Secondary | ICD-10-CM | POA: Diagnosis present

## 2020-05-03 DIAGNOSIS — S0101XA Laceration without foreign body of scalp, initial encounter: Secondary | ICD-10-CM | POA: Diagnosis present

## 2020-05-03 DIAGNOSIS — D62 Acute posthemorrhagic anemia: Secondary | ICD-10-CM | POA: Diagnosis not present

## 2020-05-03 DIAGNOSIS — K567 Ileus, unspecified: Secondary | ICD-10-CM | POA: Diagnosis not present

## 2020-05-03 DIAGNOSIS — W1830XA Fall on same level, unspecified, initial encounter: Secondary | ICD-10-CM | POA: Diagnosis present

## 2020-05-03 DIAGNOSIS — E875 Hyperkalemia: Secondary | ICD-10-CM | POA: Diagnosis present

## 2020-05-03 DIAGNOSIS — K920 Hematemesis: Secondary | ICD-10-CM | POA: Diagnosis present

## 2020-05-03 DIAGNOSIS — S065X9A Traumatic subdural hemorrhage with loss of consciousness of unspecified duration, initial encounter: Secondary | ICD-10-CM | POA: Diagnosis present

## 2020-05-03 DIAGNOSIS — F419 Anxiety disorder, unspecified: Secondary | ICD-10-CM | POA: Diagnosis present

## 2020-05-03 DIAGNOSIS — S42352A Displaced comminuted fracture of shaft of humerus, left arm, initial encounter for closed fracture: Secondary | ICD-10-CM | POA: Diagnosis present

## 2020-05-03 DIAGNOSIS — R296 Repeated falls: Secondary | ICD-10-CM | POA: Diagnosis present

## 2020-05-03 DIAGNOSIS — K56609 Unspecified intestinal obstruction, unspecified as to partial versus complete obstruction: Secondary | ICD-10-CM | POA: Diagnosis not present

## 2020-05-03 DIAGNOSIS — I120 Hypertensive chronic kidney disease with stage 5 chronic kidney disease or end stage renal disease: Secondary | ICD-10-CM | POA: Diagnosis present

## 2020-05-03 DIAGNOSIS — F319 Bipolar disorder, unspecified: Secondary | ICD-10-CM | POA: Diagnosis present

## 2020-05-03 DIAGNOSIS — Z20822 Contact with and (suspected) exposure to covid-19: Secondary | ICD-10-CM | POA: Diagnosis present

## 2020-05-03 DIAGNOSIS — E785 Hyperlipidemia, unspecified: Secondary | ICD-10-CM | POA: Diagnosis present

## 2020-05-03 DIAGNOSIS — I6201 Nontraumatic acute subdural hemorrhage: Secondary | ICD-10-CM | POA: Diagnosis present

## 2020-05-03 DIAGNOSIS — K7581 Nonalcoholic steatohepatitis (NASH): Secondary | ICD-10-CM | POA: Diagnosis present

## 2020-05-03 DIAGNOSIS — N186 End stage renal disease: Secondary | ICD-10-CM | POA: Diagnosis present

## 2020-05-03 DIAGNOSIS — K7460 Unspecified cirrhosis of liver: Secondary | ICD-10-CM | POA: Diagnosis present

## 2020-05-03 LAB — IRON AND TIBC
Iron: 55 ug/dL (ref 28–170)
Saturation Ratios: 17 % (ref 10.4–31.8)
TIBC: 325 ug/dL (ref 250–450)
UIBC: 270 ug/dL

## 2020-05-03 LAB — HIV ANTIBODY (ROUTINE TESTING W REFLEX): HIV Screen 4th Generation wRfx: NONREACTIVE

## 2020-05-03 LAB — MRSA PCR SCREENING: MRSA by PCR: NEGATIVE

## 2020-05-03 LAB — ECHOCARDIOGRAM COMPLETE
Area-P 1/2: 3.48 cm2
S' Lateral: 2.7 cm

## 2020-05-03 LAB — ABO/RH: ABO/RH(D): O POS

## 2020-05-03 LAB — GLUCOSE, CAPILLARY
Glucose-Capillary: 115 mg/dL — ABNORMAL HIGH (ref 70–99)
Glucose-Capillary: 113 mg/dL — ABNORMAL HIGH (ref 70–99)
Glucose-Capillary: 123 mg/dL — ABNORMAL HIGH (ref 70–99)
Glucose-Capillary: 87 mg/dL (ref 70–99)

## 2020-05-03 LAB — CBC
HCT: 25.4 % — ABNORMAL LOW (ref 36.0–46.0)
Hemoglobin: 7.8 g/dL — ABNORMAL LOW (ref 12.0–15.0)
MCH: 31.8 pg (ref 26.0–34.0)
MCHC: 30.7 g/dL (ref 30.0–36.0)
MCV: 103.7 fL — ABNORMAL HIGH (ref 80.0–100.0)
Platelets: 152 10*3/uL (ref 150–400)
RBC: 2.45 MIL/uL — ABNORMAL LOW (ref 3.87–5.11)
RDW: 14.3 % (ref 11.5–15.5)
WBC: 5.8 10*3/uL (ref 4.0–10.5)
nRBC: 0.3 % — ABNORMAL HIGH (ref 0.0–0.2)

## 2020-05-03 LAB — HEMOGLOBIN A1C
Hgb A1c MFr Bld: 6 % — ABNORMAL HIGH (ref 4.8–5.6)
Mean Plasma Glucose: 125.5 mg/dL

## 2020-05-03 LAB — RETICULOCYTES
Immature Retic Fract: 32.5 % — ABNORMAL HIGH (ref 2.3–15.9)
RBC.: 2.46 MIL/uL — ABNORMAL LOW (ref 3.87–5.11)
Retic Count, Absolute: 69.9 10*3/uL (ref 19.0–186.0)
Retic Ct Pct: 2.8 % (ref 0.4–3.1)

## 2020-05-03 LAB — FERRITIN: Ferritin: 151 ng/mL (ref 11–307)

## 2020-05-03 LAB — HEMOGLOBIN AND HEMATOCRIT, BLOOD
HCT: 31 % — ABNORMAL LOW (ref 36.0–46.0)
Hemoglobin: 9.6 g/dL — ABNORMAL LOW (ref 12.0–15.0)

## 2020-05-03 LAB — FOLATE: Folate: 11.9 ng/mL

## 2020-05-03 LAB — D-DIMER, QUANTITATIVE: D-Dimer, Quant: 1.93 ug{FEU}/mL — ABNORMAL HIGH (ref 0.00–0.50)

## 2020-05-03 LAB — VITAMIN B12: Vitamin B-12: 1856 pg/mL — ABNORMAL HIGH (ref 180–914)

## 2020-05-03 MED ORDER — INSULIN ASPART 100 UNIT/ML ~~LOC~~ SOLN
0.0000 [IU] | Freq: Three times a day (TID) | SUBCUTANEOUS | Status: DC
  Administered 2020-05-03 – 2020-05-04 (×3): 1 [IU] via SUBCUTANEOUS
  Administered 2020-05-07 (×2): 2 [IU] via SUBCUTANEOUS
  Administered 2020-05-10: 1 [IU] via SUBCUTANEOUS
  Administered 2020-05-10: 2 [IU] via SUBCUTANEOUS
  Administered 2020-05-10: 1 [IU] via SUBCUTANEOUS
  Administered 2020-05-11: 2 [IU] via SUBCUTANEOUS

## 2020-05-03 MED ORDER — CHLORHEXIDINE GLUCONATE CLOTH 2 % EX PADS
6.0000 | MEDICATED_PAD | Freq: Every day | CUTANEOUS | Status: DC
  Administered 2020-05-04 – 2020-05-11 (×4): 6 via TOPICAL

## 2020-05-03 MED ORDER — PERFLUTREN LIPID MICROSPHERE
1.0000 mL | INTRAVENOUS | Status: AC | PRN
  Administered 2020-05-03: 2 mL via INTRAVENOUS
  Filled 2020-05-03: qty 10

## 2020-05-03 MED ORDER — ACETAMINOPHEN 650 MG RE SUPP: 650.0000 mg | Freq: Four times a day (QID) | RECTAL | Status: DC | PRN

## 2020-05-03 MED ORDER — HYDROMORPHONE HCL 1 MG/ML IJ SOLN
1.0000 mg | INTRAMUSCULAR | Status: DC | PRN
  Administered 2020-05-03 – 2020-05-04 (×5): 1 mg via INTRAVENOUS
  Filled 2020-05-03 (×5): qty 1

## 2020-05-03 MED ORDER — ONDANSETRON HCL 4 MG/2ML IJ SOLN
4.0000 mg | Freq: Four times a day (QID) | INTRAMUSCULAR | Status: DC | PRN
  Administered 2020-05-03 – 2020-05-04 (×4): 4 mg via INTRAVENOUS
  Filled 2020-05-03 (×4): qty 2

## 2020-05-03 MED ORDER — CEFAZOLIN SODIUM-DEXTROSE 2-4 GM/100ML-% IV SOLN
2.0000 g | Freq: Once | INTRAVENOUS | Status: AC
  Administered 2020-05-04: 2 g via INTRAVENOUS
  Filled 2020-05-03: qty 100

## 2020-05-03 MED ORDER — ACETAMINOPHEN 325 MG PO TABS: 650.0000 mg | ORAL_TABLET | Freq: Four times a day (QID) | ORAL | Status: DC | PRN

## 2020-05-03 MED ORDER — SODIUM CHLORIDE 0.9 % IV SOLN: INTRAVENOUS | Status: DC

## 2020-05-03 MED ORDER — HYDRALAZINE HCL 20 MG/ML IJ SOLN
5.0000 mg | INTRAMUSCULAR | Status: DC | PRN
  Administered 2020-05-03 – 2020-05-04 (×3): 5 mg via INTRAVENOUS
  Filled 2020-05-03 (×2): qty 1

## 2020-05-03 MED ORDER — FENTANYL CITRATE (PF) 100 MCG/2ML IJ SOLN
25.0000 ug | Freq: Once | INTRAMUSCULAR | Status: AC
  Administered 2020-05-03: 25 ug via INTRAVENOUS
  Filled 2020-05-03: qty 2

## 2020-05-03 NOTE — H&P (View-Only) (Signed)
Patient is examined today.  I was asked to participate in the patient's care secondary to a complex shoulder fracture and surgical availability.  CT demonstrates a spiral oblique fracture of the humerus extending about 20 cm from the proximalmost aspect.  She has significant angular deformity.  She is significant swelling in the extremity.   Axillary nerve function appears to be okay from a sensory perspective but is difficult to test motor function due to fracture and patient discomfort and current nausea and vomiting.  Warm well-perfused extremity otherwise with moderate to severe swelling.  Fistula appears to be intact in axilla.  Due to the patient's complex presentation and dialysis she is at high risk for nonunion or malunion considering her alignment.  We talked about intramedullary fixation to avoid extensive dissection in the setting of a large soft tissue envelope.  We will do an open approach if necessary.  Specific risks including nonunion, infection, hardware prominence were all discussed.  All questions were answered we will work on surgical scheduling for tomorrow if medically cleared.

## 2020-05-03 NOTE — H&P (Signed)
History and Physical    Tina Ramos BJY:[id number] DOB: November 17, 1955 DOA: 05/02/2020  PCP: [facility] Patient coming from: [facility]  Chief Complaint: Fall, head injury  HPI: Tina Ramos is a 64 y.o. female with medical history significant of liver cirrhosis, insulin-dependent type 2 diabetes, hyperlipidemia, gastroparesis, CKD stage IV presented to the ED after sustaining head injury from a fall.  Patient was recently evaluated in the ED on 8/9 after a fall and was diagnosed with a mildly displaced spiral fracture of the proximal diaphysis of the left humerus.  Patient states she has not been able to see orthopedics yet.  States her arm is extremely swollen and it feels like it will burst.  She is having severe pain in the arm.  Also reports having numbness and tingling in her fingers in the same extremity.  States she has stage IV kidney disease and has a dialysis fistula in this arm.  States she has had multiple falls in the past 2 weeks.  On average she is following 4-5 times a day.  Each time she describes it as just falling on the ground all of a sudden.  States yesterday she went to get boost from the refrigerator and as soon as she closed the refrigerator door she fell on the ground.  She does not recall tripping over an object or losing her balance.  Denies preceding lightheadedness/dizziness, chest pain, shortness of breath, or palpitations.  States she hit her head and could feel warm blood going down her face.  Denies history of seizures.  Patient states her 53 year old father takes care of her and is concerned that she is falling so frequently.  She takes a baby aspirin daily.  ED Course: Vital signs stable in the ED.  Labs showing no leukocytosis.  Hemoglobin 7.7, MCV 103.8.  Hemoglobin was 10.4 on labs done in March 2021.  Creatinine 3.2, stable compared to labs done 5 months ago.  INR 1.1.  SARS-CoV-2 PCR test negative.  UA not suggestive of infection.  MRSA PCR screen  negative.  Head CT showing acute bilateral subdural hematomas which extend along the tentorium, right greater than left.  Right posterior parietal scalp laceration and hematoma.    CT maxillofacial showing nondisplaced bilateral nasal bone fractures.  CT C-spine negative for acute abnormality.  X-ray of right shoulder negative for acute fracture or dislocation.  Chest x-ray showing no acute intrathoracic process.  Showing known displaced left humeral diaphyseal fracture, partially imaged on the study.  Increased displacement since prior exam.  ED provider discussed the case with Dr. Trenton Gammon from neurosurgery who felt that the subdural hematomas were small.  He recommended neurochecks and potential discharge from the ED.  Admission requested as it is not safe for the patient to go home given frequency of falls.  Will need PT and OT evaluation.  Scalp laceration was repaired in the ED.  Foley catheter was placed as patient does self-catheterization at home.  Patient received fentanyl, IV fluid, and Tdap booster.  Review of Systems:  All systems reviewed and apart from history of presenting illness, are negative.  Past Medical History:  Diagnosis Date  . Cirrhosis ([place])   . Cirrhosis ([place])   . Diabetes mellitus without complication ([place])   . Gastroparesis   . Hyperlipemia   . Interstitial cystitis   . Migraine   . UTI (lower urinary tract infection)     Past Surgical History:  Procedure Laterality Date  . ABDOMINAL HYSTERECTOMY    .  ABDOMINAL SURGERY    . APPENDECTOMY    . ESOPHAGEAL DILATION    . FINGER SURGERY Left   . TOE SURGERY       reports that she has never smoked. She has never used smokeless tobacco. She reports that she does not drink alcohol and does not use drugs.  Allergies  Allergen Reactions  . Darvon [Propoxyphene]   . Sulfa Antibiotics   . Talwin [Pentazocine]   . Latex Itching and Rash    History reviewed. No pertinent family history.  Prior to  Admission medications   Medication Sig Start Date End Date Taking? Authorizing Provider  amitriptyline (ELAVIL) 50 MG tablet Take 50 mg by mouth at bedtime.    [provider]  aspirin EC 81 MG tablet Take 81 mg by mouth daily.    [provider]  atorvastatin (LIPITOR) 20 MG tablet Take 20 mg by mouth daily.    [provider]  Atorvastatin Calcium (LIPITOR PO) Take by mouth.    [provider]  buPROPion (WELLBUTRIN SR) 100 MG 12 hr tablet Take 200 mg by mouth 2 (two) times daily.    [provider]  buPROPion (WELLBUTRIN SR) 150 MG 12 hr tablet Take 150 mg by mouth 2 (two) times daily.    [provider]  buPROPion (WELLBUTRIN) 100 MG tablet Take 100 mg by mouth 2 (two) times daily.    [provider]  calcitRIOL (ROCALTROL) 0.5 MCG capsule Take 0.5 mcg by mouth daily.    [provider]  calcium carbonate (OSCAL) 1500 (600 Ca) MG TABS tablet Take 1,500 mg by mouth 2 (two) times daily with a meal.    [provider]  clonazePAM (KLONOPIN) 0.5 MG tablet Take 0.5 mg by mouth as directed. .5mg  tablet 1-2 tablets in am, 1 tablet in afternoon, then 1 tablet at bedtime    [provider]  estradiol (ESTRACE) 1 MG tablet Take 1 mg by mouth daily.    [provider]  ferrous sulfate 325 (65 FE) MG tablet Take 325 mg by mouth daily with breakfast.    [provider]  fexofenadine (ALLEGRA) 60 MG tablet Take 1 tablet (60 mg total) by mouth 2 (two) times daily. 01/23/14   Malvin Johns, MD  gabapentin (NEURONTIN) 100 MG capsule Take 100 mg by mouth 3 (three) times daily.    [provider]  HYDROcodone-acetaminophen (NORCO/VICODIN) 5-325 MG tablet Take 1-2 tablets by mouth every 6 (six) hours as needed. 04/26/20   Horton, Barbette Hair, MD  ibuprofen (ADVIL,MOTRIN) 600 MG tablet Take 1 tablet (600 mg total) by mouth every 6 (six) hours as needed. 11/27/14   Varney Biles, MD  insulin detemir  (LEVEMIR) 100 UNIT/ML injection Inject into the skin daily.    [provider]  insulin glargine (LANTUS) 100 UNIT/ML injection Inject into the skin at bedtime.    [provider]  lamoTRIgine (LAMICTAL) 100 MG tablet Take 100 mg by mouth 2 (two) times daily.    [provider]  lamoTRIgine (LAMICTAL) 25 MG tablet Take 25 mg by mouth daily.    [provider]  metoprolol succinate (TOPROL-XL) 25 MG 24 hr tablet Take 25 mg by mouth daily.    [provider]  mirtazapine (REMERON) 15 MG tablet Take 15 mg by mouth at bedtime.    [provider]  mupirocin cream (BACTROBAN) 2 % Apply 1 application topically 2 (two) times daily. For 7 days 03/17/15   Malvin Johns,  MD  ondansetron (ZOFRAN ODT) 4 MG disintegrating tablet 4mg  ODT q4 hours prn nausea/vomit 10/02/18   Deno Etienne, DO  oxybutynin (DITROPAN-XL) 10 MG 24 hr tablet Take 10 mg by mouth at bedtime.    [provider]  pantoprazole (PROTONIX) 40 MG tablet Take 40 mg by mouth daily.    [provider]  pravastatin (PRAVACHOL) 20 MG tablet Take 20 mg by mouth daily.    [provider]  pregabalin (LYRICA) 150 MG capsule Take 150 mg by mouth 2 (two) times daily.    [provider]  promethazine (PHENERGAN) 25 MG tablet Take 1 tablet (25 mg total) by mouth every 6 (six) hours as needed for nausea. 11/27/14   Varney Biles, MD  QUEtiapine (SEROQUEL) 300 MG tablet Take 300 mg by mouth at bedtime.    [provider]  QUEtiapine Fumarate (SEROQUEL PO) Take by mouth.    [provider]  rizatriptan (MAXALT) 10 MG tablet Take 10 mg by mouth as needed for migraine. May repeat in 2 hours if needed    [provider]  sevelamer carbonate (RENVELA) 800 MG tablet Take 800 mg by mouth 3 (three) times daily with meals.    [provider]  sitaGLIPtin-metformin (JANUMET) 50-1000 MG per tablet Take 1 tablet by mouth 2 (two) times daily with a  meal.    [provider]  tiZANidine (ZANAFLEX) 4 MG tablet Take 4 mg by mouth every 6 (six) hours as needed for muscle spasms.    [provider]    Physical Exam: Vitals:   05/02/20 2001 05/02/20 2200 05/02/20 2341 05/03/20 0306  BP: (!) 123/47 (!) 137/49 (!) 151/44 (!) 175/57  Pulse: 84 85 94 95  Resp: 15 12 20 12   Temp:   98.4 F (36.9 C) 98.4 F (36.9 C)  TempSrc:   Oral Oral  SpO2: 91% 100% 100% 100%    Physical Exam Constitutional:      Appearance: She is not diaphoretic.  HENT:     Head: Normocephalic.     Comments: Staples in place for right posterior scalp laceration, no active bleeding    Mouth/Throat:     Mouth: Mucous membranes are moist.     Pharynx: Oropharynx is clear.  Eyes:     Extraocular Movements: Extraocular movements intact.     Pupils: Pupils are equal, round, and reactive to light.  Cardiovascular:     Rate and Rhythm: Normal rate and regular rhythm.     Pulses: Normal pulses.     Heart sounds: Murmur heard.      Comments: Murmur appreciated at the right upper sternal border Pulmonary:     Effort: Pulmonary effort is normal. No respiratory distress.     Breath sounds: Normal breath sounds. No wheezing or rales.  Abdominal:     General: Bowel sounds are normal. There is no distension.     Palpations: Abdomen is soft.     Tenderness: There is no abdominal tenderness. There is no guarding.  Musculoskeletal:        General: Swelling present.     Cervical back: Normal range of motion and neck supple.     Comments: Left upper extremity extremely swollen with swelling extending from the upper arm down to the dorsum of the hand.  Patient is having difficulty making a fist.  Radial pulse strong.   Skin:    General: Skin is warm and dry.     Comments: Extensive ecchymosis noted across the  chest chest wall and left upper extremity  Neurological:     Mental Status: She is alert and oriented to person, place, and time.     Comments: No  motor or sensory deficit in the right upper extremity and bilateral lower extremities.  Left upper extremity in a splint and examination limited.     Labs on Admission: I have personally reviewed following labs and imaging studies  CBC: Recent Labs  Lab 05/02/20 1908  WBC 7.9  NEUTROABS 5.7  HGB 7.7*  HCT 24.4*  MCV 103.8*  PLT 564   Basic Metabolic Panel: Recent Labs  Lab 05/02/20 1908  NA 137  K 4.3  CL 103  CO2 22  GLUCOSE 127*  BUN 48*  CREATININE 3.28*  CALCIUM 8.2*   GFR: Estimated Creatinine Clearance: 14.8 mL/min (A) (by C-G formula based on SCr of 3.28 mg/dL (H)). Liver Function Tests: Recent Labs  Lab 05/02/20 1908  AST 29  ALT 17  ALKPHOS 60  BILITOT 0.7  PROT 6.7  ALBUMIN 3.6   No results for input(s): LIPASE, AMYLASE in the last 168 hours. No results for input(s): AMMONIA in the last 168 hours. Coagulation Profile: Recent Labs  Lab 05/02/20 1928  INR 1.1   Cardiac Enzymes: No results for input(s): CKTOTAL, CKMB, CKMBINDEX, TROPONINI in the last 168 hours. BNP (last 3 results) No results for input(s): PROBNP in the last 8760 hours. HbA1C: No results for input(s): HGBA1C in the last 72 hours. CBG: No results for input(s): GLUCAP in the last 168 hours. Lipid Profile: No results for input(s): CHOL, HDL, LDLCALC, TRIG, CHOLHDL, LDLDIRECT in the last 72 hours. Thyroid Function Tests: No results for input(s): TSH, T4TOTAL, FREET4, T3FREE, THYROIDAB in the last 72 hours. Anemia Panel: No results for input(s): VITAMINB12, FOLATE, FERRITIN, TIBC, IRON, RETICCTPCT in the last 72 hours. Urine analysis:    Component Value Date/Time   COLORURINE YELLOW 05/02/2020 2240   APPEARANCEUR CLEAR 05/02/2020 2240   LABSPEC <1.005 (L) 05/02/2020 2240   PHURINE 5.0 05/02/2020 2240   GLUCOSEU NEGATIVE 05/02/2020 2240   HGBUR NEGATIVE 05/02/2020 2240   BILIRUBINUR NEGATIVE 05/02/2020 2240   KETONESUR NEGATIVE 05/02/2020 2240   PROTEINUR NEGATIVE  05/02/2020 2240   UROBILINOGEN 0.2 01/05/2015 1825   NITRITE NEGATIVE 05/02/2020 2240   LEUKOCYTESUR NEGATIVE 05/02/2020 2240    Radiological Exams on Admission: DG Chest 2 View  Result Date: 05/02/2020 CLINICAL DATA:  [place], recent left humeral fracture EXAM: CHEST - 2 VIEW COMPARISON:  04/25/2020 FINDINGS: Frontal and lateral views of the chest demonstrate displaced comminuted left humeral diaphyseal fracture, with increased displacement since prior study. Cardiac silhouette is unremarkable. No airspace disease, effusion, or pneumothorax. No new fractures. IMPRESSION: 1. Known displaced left humeral diaphyseal fracture, partially imaged on this study. Increased displacement since prior exam. 2. Otherwise no acute intrathoracic process. Electronically Signed   By: Randa Ngo M.D.   On: 05/02/2020 19:25   DG Shoulder Right  Result Date: 05/02/2020 CLINICAL DATA:  Right shoulder pain after fall EXAM: RIGHT SHOULDER - 2+ VIEW COMPARISON:  04/18/2020 FINDINGS: Frontal, transscapular, and axillary views of the right shoulder demonstrate no fracture, subluxation, or dislocation. Mild acromioclavicular and glenohumeral joint osteoarthritis. Right chest is clear. Soft tissues are normal. IMPRESSION: 1. Osteoarthritis.  No acute fracture. Electronically Signed   By: Randa Ngo M.D.   On: 05/02/2020 19:18   CT Head Wo Contrast  Result Date: 05/02/2020 CLINICAL DATA:  Fall.  Hit head on plaster wall. EXAM: CT  HEAD WITHOUT CONTRAST CT MAXILLOFACIAL WITHOUT CONTRAST CT CERVICAL SPINE WITHOUT CONTRAST TECHNIQUE: Multidetector CT imaging of the head, cervical spine, and maxillofacial structures were performed using the standard protocol without intravenous contrast. Multiplanar CT image reconstructions of the cervical spine and maxillofacial structures were also generated. COMPARISON:  CT head 01/21/2020 CT maxillofacial 08/04/2018. FINDINGS: CT HEAD FINDINGS Brain: There are acute bilateral subdural  hematomas which extend along the tentorium, right greater than left. On the right this measures up to 6 mm in thickness. On the left this measures approximately 3 mm in maximum thickness. No signs of acute brain infarct or mass. Ventricular volumes are within normal limits. Vascular: No hyperdense vessel or unexpected calcification. Skull: Normal. Negative for fracture or focal lesion. Other: Right posterior parietal scalp laceration and hematoma is noted, image 62/3. CT MAXILLOFACIAL FINDINGS Osseous: Nondisplaced bilateral nasal bone fractures, image 48/3. No additional fractures identified. Orbits: Negative. No traumatic or inflammatory finding. Sinuses: Clear Soft tissues: Negative. CT CERVICAL SPINE FINDINGS Alignment: Alignment of the cervical spine appears normal. The facet joints appear well aligned. Skull base and vertebrae: No acute fracture. No primary bone lesion or focal pathologic process. Soft tissues and spinal canal: No prevertebral fluid or swelling. No visible canal hematoma. Disc levels:  Within normal limits. Upper chest: Negative. Other: None IMPRESSION: 1. Acute bilateral subdural hematomas which extend along the tentorium, right greater than left. 2. Right posterior parietal scalp laceration and hematoma. 3. Nondisplaced bilateral nasal bone fractures. 4. No evidence for cervical spine fracture or dislocation. 5. Critical Value/emergent results were called by telephone at the time of interpretation on 05/02/2020 at 7:11 pm to provider Dr. Melina Copa, Who verbally acknowledged these results. Electronically Signed   By: Kerby Moors M.D.   On: 05/02/2020 19:12   CT Cervical Spine Wo Contrast  Result Date: 05/02/2020 CLINICAL DATA:  Fall.  Hit head on plaster wall. EXAM: CT HEAD WITHOUT CONTRAST CT MAXILLOFACIAL WITHOUT CONTRAST CT CERVICAL SPINE WITHOUT CONTRAST TECHNIQUE: Multidetector CT imaging of the head, cervical spine, and maxillofacial structures were performed using the standard  protocol without intravenous contrast. Multiplanar CT image reconstructions of the cervical spine and maxillofacial structures were also generated. COMPARISON:  CT head 01/21/2020 CT maxillofacial 08/04/2018. FINDINGS: CT HEAD FINDINGS Brain: There are acute bilateral subdural hematomas which extend along the tentorium, right greater than left. On the right this measures up to 6 mm in thickness. On the left this measures approximately 3 mm in maximum thickness. No signs of acute brain infarct or mass. Ventricular volumes are within normal limits. Vascular: No hyperdense vessel or unexpected calcification. Skull: Normal. Negative for fracture or focal lesion. Other: Right posterior parietal scalp laceration and hematoma is noted, image 62/3. CT MAXILLOFACIAL FINDINGS Osseous: Nondisplaced bilateral nasal bone fractures, image 48/3. No additional fractures identified. Orbits: Negative. No traumatic or inflammatory finding. Sinuses: Clear Soft tissues: Negative. CT CERVICAL SPINE FINDINGS Alignment: Alignment of the cervical spine appears normal. The facet joints appear well aligned. Skull base and vertebrae: No acute fracture. No primary bone lesion or focal pathologic process. Soft tissues and spinal canal: No prevertebral fluid or swelling. No visible canal hematoma. Disc levels:  Within normal limits. Upper chest: Negative. Other: None IMPRESSION: 1. Acute bilateral subdural hematomas which extend along the tentorium, right greater than left. 2. Right posterior parietal scalp laceration and hematoma. 3. Nondisplaced bilateral nasal bone fractures. 4. No evidence for cervical spine fracture or dislocation. 5. Critical Value/emergent results were called by telephone at the time of  interpretation on 05/02/2020 at 7:11 pm to provider Dr. Melina Copa, Who verbally acknowledged these results. Electronically Signed   By: Kerby Moors M.D.   On: 05/02/2020 19:12   CT Maxillofacial WO CM  Result Date: 05/02/2020 CLINICAL  DATA:  Fall.  Hit head on plaster wall. EXAM: CT HEAD WITHOUT CONTRAST CT MAXILLOFACIAL WITHOUT CONTRAST CT CERVICAL SPINE WITHOUT CONTRAST TECHNIQUE: Multidetector CT imaging of the head, cervical spine, and maxillofacial structures were performed using the standard protocol without intravenous contrast. Multiplanar CT image reconstructions of the cervical spine and maxillofacial structures were also generated. COMPARISON:  CT head 01/21/2020 CT maxillofacial 08/04/2018. FINDINGS: CT HEAD FINDINGS Brain: There are acute bilateral subdural hematomas which extend along the tentorium, right greater than left. On the right this measures up to 6 mm in thickness. On the left this measures approximately 3 mm in maximum thickness. No signs of acute brain infarct or mass. Ventricular volumes are within normal limits. Vascular: No hyperdense vessel or unexpected calcification. Skull: Normal. Negative for fracture or focal lesion. Other: Right posterior parietal scalp laceration and hematoma is noted, image 62/3. CT MAXILLOFACIAL FINDINGS Osseous: Nondisplaced bilateral nasal bone fractures, image 48/3. No additional fractures identified. Orbits: Negative. No traumatic or inflammatory finding. Sinuses: Clear Soft tissues: Negative. CT CERVICAL SPINE FINDINGS Alignment: Alignment of the cervical spine appears normal. The facet joints appear well aligned. Skull base and vertebrae: No acute fracture. No primary bone lesion or focal pathologic process. Soft tissues and spinal canal: No prevertebral fluid or swelling. No visible canal hematoma. Disc levels:  Within normal limits. Upper chest: Negative. Other: None IMPRESSION: 1. Acute bilateral subdural hematomas which extend along the tentorium, right greater than left. 2. Right posterior parietal scalp laceration and hematoma. 3. Nondisplaced bilateral nasal bone fractures. 4. No evidence for cervical spine fracture or dislocation. 5. Critical Value/emergent results were called  by telephone at the time of interpretation on 05/02/2020 at 7:11 pm to provider Dr. Melina Copa, Who verbally acknowledged these results. Electronically Signed   By: Kerby Moors M.D.   On: 05/02/2020 19:12    EKG: Independently reviewed.  Sinus rhythm, no significant change since prior tracing.  Assessment/Plan Principal Problem:   Subdural hematoma (HCC) Active Problems:   Humerus fracture   Scalp laceration   Nasal bone fractures   Falls frequently   Acute bilateral subdural hematomas secondary to a fall: Patient takes aspirin 81 mg daily at home.  Examination of left upper extremity limited secondary to fracture/splint.  No other focal neuro deficit appreciated on exam. Head CT showing acute bilateral subdural hematomas which extend along the tentorium, right greater than left.  ED provider discussed the case with neurosurgery and and it was felt that the hematomas were small.  Recommended neurochecks. -Frequent neurochecks.  Repeat head CT ordered for early morning.  Displaced spiral fracture of the proximal diaphysis of the left humerus: Diagnosed after a fall on 8/9.  Imaging done today showing increased displacement.  There is concern for possible compartment syndrome since the left upper extremity is extremely swollen and patient with complaints of severe pain and paresthesias. -Dilaudid as needed for severe pain.  I have discussed the case with Dr. Percell Miller, he will see the patient.  Appreciate recommendations.  Scalp laceration -Repaired in the ED. Tdap booster was given.  Nondisplaced bilateral nasal bone fractures: Seen on CT. -Please ensure ENT follow-up  Frequent falls: Although she does not describe a prodrome, her history of frequent falls is concerning for possible syncopal events.  Differentials include arrhythmia, structural heart disease, or possible seizures.  Murmur appreciated at the right upper sternal border on exam.  PE less likely given no hypoxia or  tachycardia. -Cardiac monitoring.  Order echocardiogram and EEG.  Check D-dimer level although can be elevated to some degree given CKD stage IV.  Fall precautions, PT/OT evaluation.  Will hold on checking orthostatics at this time given significant injuries from frequent falls.  This can be done in the morning when PT/OT are present.  Acute on chronic anemia: Hemoglobin 7.7, MCV 103.8.  Hemoglobin was 10.4 on labs done in March 2021.  Patient is not endorsing any symptoms of GI bleed.  Does have significant ecchymosis to her chest wall and left upper extremity from her recent fall which could possibly be contributing to the dropping hemoglobin to some degree. -Anemia panel and FOBT.  Repeat CBC in a.m.  Hyperlipidemia -Resume statin after pharmacy med rec is done  Hypertension -Hydralazine PRN SBP >170  Insulin-dependent type 2 diabetes -Check A1c.  Sliding scale insulin sensitive with meals.  Resume home long-acting insulin after pharmacy med rec is done.  Mood disorder -Resume home medications after pharmacy med rec is done  CKD stage IV: Renal function at baseline. -Resume home Renvela after pharmacy med rec is done  DVT prophylaxis: SCDs Code Status: Full code Family Communication: No family available at this time. Disposition Plan: Status is: Observation  The patient remains OBS appropriate and will d/c before 2 midnights.  Dispo: The patient is from: Home              Anticipated d/c is to: SNF              Anticipated d/c date is: 2 days              Patient currently is not medically stable to d/c.  The medical decision making on this patient was of high complexity and the patient is at high risk for clinical deterioration, therefore this is a level 3 visit.  Shela Leff MD [facility]  If 7PM-7AM, please contact night-coverage [email or web address]  05/03/2020, 4:42 AM

## 2020-05-03 NOTE — Progress Notes (Signed)
Patient c/o pain and numbness to the LUE. Patient states that it feels like it is going to burst. MD Marlowe Sax is aware of this from coming to unit to do admission assessment.   Gave patient prn dilaudid and stated that arm feels better. Will continue to monitor and will update MD if arm becomes worse.   Sharin Mons, RN

## 2020-05-03 NOTE — Evaluation (Signed)
Physical Therapy Evaluation Patient Details Name: Tina Ramos MRN: [id number] DOB: 1955-09-21 Today's Date: 05/03/2020   History of Present Illness  This is a 64 year old female with a history of cirrhosis, diabetes, hyperlipidemia who presents with left arm pain.  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 D/C'd home. Back to ED on 8/16 suffering a fall while she was in her kitchen.  She states she was at the refrigerator and started to fall hitting the drywall with her head and right shoulder then sliding down onto her butt.Reports 4-5 falls per day since 8/9. XI:PJASN acute bilateral subdural hematomas which extend along the tentorium, right greater than left.  She has nondisplaced bilateral nasal bone fractures which will likely not require any ENT intervention.Imaging of left UE fracture showing increased displacement.  There is concern for possible compartment syndrome since the left upper extremity is extremely swollen and patient with complaints of severe pain and paresthesias.  Clinical Impression  Pt admitted with above diagnosis. Pt vomiting with initial sitting up EOB, subsided after 2 episodes, RN aware. Required min A for sit<>stand and 100' ambulation with occasional mod A needed for misstep with LOB. Pt's HR increased to 142 bpm with ambulation and pt reported feeling light headed. Ambulation discontinued and pt to chair for seated rest break with HR to 120 bpm. Pt not safe to go home with 78 yo father, recommend SNF for rehab.  Pt currently with functional limitations due to the deficits listed below (see PT Problem List). Pt will benefit from skilled PT to increase their independence and safety with mobility to allow discharge to the venue listed below.       Follow Up Recommendations SNF;Supervision/Assistance - 24 hour    Equipment Recommendations  Other (comment)  (TBD)    Recommendations for Other Services       Precautions / Restrictions Precautions Precautions: Fall;Shoulder Shoulder Interventions: Shoulder sling/immobilizer Precaution Booklet Issued: No Required Braces or Orthoses: Sling Restrictions Weight Bearing Restrictions: Yes LUE Weight Bearing: Non weight bearing      Mobility  Bed Mobility Overal bed mobility: Needs Assistance Bed Mobility: Supine to Sit Rolling: Supervision (to right) Sidelying to sit: Supervision (from right side) Supine to sit: Min assist     General bed mobility comments: min HHA for elevation of trunk into sitting  Transfers Overall transfer level: Needs assistance Equipment used: 1 person hand held assist Transfers: Sit to/from Stand Sit to Stand: Min assist         General transfer comment: min A to steady  Ambulation/Gait Ambulation/Gait assistance: Min assist;Mod assist Gait Distance (Feet): 100 Feet Assistive device: 1 person hand held assist Gait Pattern/deviations: Step-through pattern;Drifts right/left Gait velocity: decreased Gait velocity interpretation: <1.8 ft/sec, indicate of risk for recurrent falls General Gait Details: min A with occasional mod A for misstep with mild LOB. HR up to 142 bpm with ambulation, pt noted feeling light headed. HR returned to 120 bpm with seated rest  Stairs            Wheelchair Mobility    Modified Rankin (Stroke Patients Only)       Balance Overall balance assessment: Needs assistance Sitting-balance support: No upper extremity supported;Feet supported Sitting balance-Leahy Scale: Good     Standing balance support: Single extremity supported Standing balance-Leahy Scale: Poor Standing balance comment: unsteady without UE support  Pertinent Vitals/Pain Pain Assessment: 0-10 Pain Score: 7  Pain Location: LUE Pain Descriptors / Indicators: Aching;Sore Pain Intervention(s): Limited  activity within patient's tolerance;Monitored during session;RN gave pain meds during session    [place] expects to be discharged to:: Private residence Living Arrangements: Alone Available Help at Discharge: Family;Available 24 hours/day Type of Home: House Home Access: Stairs to enter Entrance Stairs-Rails: Left;Right;Can reach both Entrance Stairs-Number of Steps: 2 Home Layout: One level Home Equipment: Shower seat Additional Comments: She can stay at her Dad's house but Dad is 57 yo, can only provide supervision    Prior Function Level of Independence: Independent         Comments: Until she fell and broke her arm 8/9. Noted scrpaes and bruises all over UEs and LEs from recent falls.      Hand Dominance   Dominant Hand: Right    Extremity/Trunk Assessment   Upper Extremity Assessment Upper Extremity Assessment: Defer to OT evaluation LUE Deficits / Details: repositioned in immobilizer sling, very edematous throughout, no PROM attempted    Lower Extremity Assessment Lower Extremity Assessment: Overall WFL for tasks assessed    Cervical / Trunk Assessment Cervical / Trunk Assessment: Normal  Communication   Communication: No difficulties  Cognition Arousal/Alertness: Awake/alert Behavior During Therapy: WFL for tasks assessed/performed Overall Cognitive Status: Within Functional Limits for tasks assessed                                 General Comments: mildly impulsive      General Comments General comments (skin integrity, edema, etc.): pt vomiting dark liquid when first sitting up and while speaking with Dr Griffin Basil. after 2 episodes did not vomit again    Exercises     Assessment/Plan    PT Assessment Patient needs continued PT services  PT Problem List Decreased balance;Decreased mobility;Decreased knowledge of use of DME;Decreased knowledge of precautions;Pain;Cardiopulmonary status limiting activity       PT  Treatment Interventions DME instruction;Gait training;Functional mobility training;Therapeutic activities;Therapeutic exercise;Balance training;Patient/family education    PT Goals (Current goals can be found in the Care Plan section)  Acute Rehab PT Goals Patient Stated Goal: to not keep falling and get arm fixed PT Goal Formulation: With patient Time For Goal Achievement: 05/17/20 Potential to Achieve Goals: Good    Frequency Min 3X/week   Barriers to discharge Decreased caregiver support lives alone, if someone stays with her it will be elderly father    Co-evaluation               AM-PAC PT "6 Clicks" Mobility  Outcome Measure Help needed turning from your back to your side while in a flat bed without using bedrails?: A Lot Help needed moving from lying on your back to sitting on the side of a flat bed without using bedrails?: A Little Help needed moving to and from a bed to a chair (including a wheelchair)?: A Little Help needed standing up from a chair using your arms (e.g., wheelchair or bedside chair)?: A Little Help needed to walk in hospital room?: A Little Help needed climbing 3-5 steps with a railing? : A Lot 6 Click Score: 16    End of Session Equipment Utilized During Treatment: Gait belt;Other (comment) (sling) Activity Tolerance: Patient tolerated treatment well Patient left: in chair;with call bell/phone within reach Nurse Communication: Mobility status PT Visit Diagnosis: Unsteadiness on feet (R26.81);Repeated falls (R29.6);Pain Pain - Right/Left:  Left Pain - part of body: Arm    Time: 4287-6811 PT Time Calculation (min) (ACUTE ONLY): 26 min   Charges:   PT Evaluation $PT Eval Moderate Complexity: 1 Mod PT Treatments $Gait Training: 8-22 mins        Leighton Roach, [place]  Pager [phone] Office [place] 05/03/2020, 1:55 PM

## 2020-05-03 NOTE — Progress Notes (Signed)
Patient is examined today.  I was asked to participate in the patient's care secondary to a complex shoulder fracture and surgical availability.  CT demonstrates a spiral oblique fracture of the humerus extending about 20 cm from the proximalmost aspect.  She has significant angular deformity.  She is significant swelling in the extremity.   Axillary nerve function appears to be okay from a sensory perspective but is difficult to test motor function due to fracture and patient discomfort and current nausea and vomiting.  Warm well-perfused extremity otherwise with moderate to severe swelling.  Fistula appears to be intact in axilla.  Due to the patient's complex presentation and dialysis she is at high risk for nonunion or malunion considering her alignment.  We talked about intramedullary fixation to avoid extensive dissection in the setting of a large soft tissue envelope.  We will do an open approach if necessary.  Specific risks including nonunion, infection, hardware prominence were all discussed.  All questions were answered we will work on surgical scheduling for tomorrow if medically cleared.

## 2020-05-03 NOTE — Progress Notes (Signed)
  Echocardiogram 2D Echocardiogram has been performed.  Tina Ramos 05/03/2020, 9:56 AM

## 2020-05-03 NOTE — Procedures (Signed)
Patient Name: Tina Ramos  MRN: [id number]  Epilepsy Attending: Lora Havens  Referring Physician/Provider: Dr. Shela Leff Date: 8/70/2021 Duration: 26.19 mins  Patient history: 24 old female with multiple episodes of fall/syncope.  EEG evaluate for seizures.  Level of alertness: Awake  AEDs during EEG study: None  Technical aspects: This EEG study was done with scalp electrodes positioned according to the 10-20 International system of electrode placement. Electrical activity was acquired at a sampling rate of 500Hz  and reviewed with a high frequency filter of 70Hz  and a low frequency filter of 1Hz . EEG data were recorded continuously and digitally stored.   Description: The posterior dominant rhythm consists of 7.5 Hz activity of moderate voltage (25-35 uV) seen predominantly in posterior head regions, symmetric and reactive to eye opening and eye closing. Hyperventilation and photic stimulation were not performed.     IMPRESSION: This study is within normal limits. No seizures or epileptiform discharges were seen throughout the recording.  Mohsen Odenthal Barbra Sarks

## 2020-05-03 NOTE — Progress Notes (Signed)
EEG complete - results pending 

## 2020-05-03 NOTE — Evaluation (Addendum)
Occupational Therapy Evaluation Patient Details Name: Tina Ramos MRN: [id number] DOB: [birth year] Today's Date: 05/03/2020    History of Present Illness This is a 64 year old female with a history of cirrhosis, diabetes, hyperlipidemia who presents with left arm pain.  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 D/C'd home. Back to ED on 8/16 suffering a fall while she was in her kitchen.  She states she was at the refrigerator and started to fall hitting the drywall with her head and right shoulder then sliding down onto her butt.Reports 4-5 falls per day since 8/9. SW:NIOEV acute bilateral subdural hematomas which extend along the tentorium, right greater than left.  She has nondisplaced bilateral nasal bone fractures which will likely not require any ENT intervention.Imaging of left UE fracture showing increased displacement.  There is concern for possible compartment syndrome since the left upper extremity is extremely swollen and patient with complaints of severe pain and paresthesias.   Clinical Impression   This 64 yo female admitted with above presents to acute OT with PLOF before 8/9 of being totally independent with basic and IADLs. Currently she is min A for for standing balance and transfers and Min-Max A for basic ADLs. She reports falling 4-5 times/day since D/C'd in 8/9, due to this and increased need for A for basic ADLs feel she will benefit from acute OT with follow up at SNF.    Follow Up Recommendations  SNF    Equipment Recommendations  None recommended by OT       Precautions / Restrictions Precautions Precautions: Fall;Shoulder Shoulder Interventions: Shoulder sling/immobilizer Required Braces or Orthoses: Sling Restrictions Weight Bearing Restrictions: Yes LUE Weight Bearing: Non weight bearing      Mobility Bed Mobility Overal bed mobility: Needs  Assistance Bed Mobility: Rolling;Sidelying to Sit;Supine to Sit Rolling: Supervision (to right) Sidelying to sit: Supervision (from right side) Supine to sit: Supervision        Transfers Overall transfer level: Needs assistance Equipment used: 1 person hand held assist Transfers: Sit to/from Stand Sit to Stand: Min assist              Balance Overall balance assessment: Needs assistance Sitting-balance support: No upper extremity supported;Feet supported Sitting balance-Leahy Scale: Good     Standing balance support: Single extremity supported Standing balance-Leahy Scale: Poor                             ADL either performed or assessed with clinical judgement   ADL Overall ADL's : Needs assistance/impaired Eating/Feeding: Set up;Sitting   Grooming: Minimal assistance;Sitting   Upper Body Bathing: Moderate assistance;Sitting   Lower Body Bathing: Moderate assistance Lower Body Bathing Details (indicate cue type and reason): min A sit<>stand Upper Body Dressing : Maximal assistance;Sitting   Lower Body Dressing: Moderate assistance Lower Body Dressing Details (indicate cue type and reason): min A sit<>stand Toilet Transfer: Minimal assistance;Stand-pivot   Toileting- Clothing Manipulation and Hygiene: Moderate assistance Toileting - Clothing Manipulation Details (indicate cue type and reason): min A sit<>stand             Vision Baseline Vision/History: Wears glasses Wears Glasses: At all times Patient Visual Report: No change from baseline              Pertinent Vitals/Pain Pain Assessment: 0-10 Pain Score: 7  Pain Location: LUE Pain  Descriptors / Indicators: Aching;Sore Pain Intervention(s): Limited activity within patient's tolerance;Monitored during session;Repositioned;Premedicated before session     Hand Dominance Right   Extremity/Trunk Assessment Upper Extremity Assessment Upper Extremity Assessment: LUE  deficits/detail LUE Deficits / Details: repositioned in immobilizer sling, very edematous throughout, no PROM attempted           Communication Communication Communication: No difficulties   Cognition Arousal/Alertness: Awake/alert Behavior During Therapy: WFL for tasks assessed/performed Overall Cognitive Status: Within Functional Limits for tasks assessed                                                Home Living Family/patient expects to be discharged to:: Private residence Living Arrangements: Alone Available Help at Discharge: Family;Available 24 hours/day Type of Home: House Home Access: Stairs to enter [employer] of Steps: 2 Entrance Stairs-Rails: Left;Right;Can reach both Home Layout: One level     Bathroom Shower/Tub: Corporate investment banker: Standard     Home Equipment: Shower seat   Additional Comments: She can stay at her Dad's house      Prior Functioning/Environment Level of Independence: Independent        Comments: Until she fell and broke her arm 8/9        OT Problem List: Decreased strength;Decreased range of motion;Impaired balance (sitting and/or standing);Impaired UE functional use;Pain      OT Treatment/Interventions: Self-care/ADL training;DME and/or AE instruction;Patient/family education;Balance training    OT Goals(Current goals can be found in the care plan section) Acute Rehab OT Goals Patient Stated Goal: to not keep falling and get arm fixed OT Goal Formulation: With patient Time For Goal Achievement: 05/17/20 Potential to Achieve Goals: Good  OT Frequency: Min 2X/week              AM-PAC OT "6 Clicks" Daily Activity     Outcome Measure Help from another person eating meals?: A Little Help from another person taking care of personal grooming?: A Lot Help from another person toileting, which includes using toliet, bedpan, or urinal?: A Lot Help from another person bathing  (including washing, rinsing, drying)?: A Lot Help from another person to put on and taking off regular upper body clothing?: A Lot Help from another person to put on and taking off regular lower body clothing?: A Lot 6 Click Score: 13   End of Session Equipment Utilized During Treatment: Other (comment) (RUE sling immobilizer) Nurse Communication: Mobility status  Activity Tolerance: Patient tolerated treatment well Patient left: in bed;with call bell/phone within reach (due to pt going down for a CT)  OT Visit Diagnosis: Unsteadiness on feet (R26.81);Other abnormalities of gait and mobility (R26.89);Repeated falls (R29.6);History of falling (Z91.81);Pain Pain - Right/Left: Right Pain - part of body: Shoulder;Arm                Time: 8:46-9:03 and 9:47-10:02 (ECHO test inbetween) OT Time Calculation (min): 32 min Charges:  OT General Charges $OT Visit: 1 Visit OT Evaluation $OT Eval Moderate Complexity: 1 Mod OT Treatments $Self Care/Home Management : 8-22 mins  [address], OTR/L Acute [employer] Pager [phone] Office [phone]     Almon Register 05/03/2020, 11:28 AM

## 2020-05-03 NOTE — Consult Note (Signed)
ORTHOPAEDIC CONSULTATION  REQUESTING PHYSICIAN: Shawna Clamp, MD  Chief Complaint: Left humerus fracture; left arm swelling and pain after fall 04/25/20  HPI: Tina Ramos is a 64 y.o. female who complains of  Left arm pain after fall  Imaging shows spiral fracture of the left humeral shaft.  Orthopedics was consulted for evaluation.    Negative history of MI, CVA, DVT, PE.  Previously ambulatory.   Past Medical History:  Diagnosis Date   Cirrhosis ([place])    Cirrhosis ([place])    Diabetes mellitus without complication ([place])    Gastroparesis    Hyperlipemia    Interstitial cystitis    Migraine    UTI (lower urinary tract infection)    Past Surgical History:  Procedure Laterality Date   ABDOMINAL HYSTERECTOMY     ABDOMINAL SURGERY     APPENDECTOMY     ESOPHAGEAL DILATION     FINGER SURGERY Left    TOE SURGERY     Social History   Socioeconomic History   Marital status: Single    Spouse name: Not on file   Number of children: Not on file   Years of education: Not on file   Highest education level: Not on file  Occupational History   Not on file  Tobacco Use   Smoking status: Never Smoker   Smokeless tobacco: Never Used  Vaping Use   Vaping Use: Never used  Substance and Sexual Activity   Alcohol use: No   Drug use: No   Sexual activity: Not on file  Other Topics Concern   Not on file  Social History Narrative   Not on file   Social Determinants of Health   Financial Resource Strain:    Difficulty of Paying Living Expenses:   Food Insecurity:    Worried About Charity fundraiser in the Last Year:    Arboriculturist in the Last Year:   Transportation Needs:    Film/video editor (Medical):    Lack of Transportation (Non-Medical):   Physical Activity:    Days of Exercise per Week:    Minutes of Exercise per Session:   Stress:    Feeling of Stress :   Social Connections:    Frequency of Communication  with Friends and Family:    Frequency of Social Gatherings with Friends and Family:    Attends Religious Services:    Active Member of Clubs or Organizations:    Attends Archivist Meetings:    Marital Status:    History reviewed. No pertinent family history. Allergies  Allergen Reactions   Darvon [Propoxyphene]    Sulfa Antibiotics    [place] [Pentazocine]    Latex Itching and Rash   Prior to Admission medications   Medication Sig Start Date End Date Taking? Authorizing Provider  amitriptyline (ELAVIL) 50 MG tablet Take 50 mg by mouth at bedtime.    [provider]  aspirin EC 81 MG tablet Take 81 mg by mouth daily.    [provider]  atorvastatin (LIPITOR) 20 MG tablet Take 20 mg by mouth daily.    [provider]  buPROPion (WELLBUTRIN SR) 100 MG 12 hr tablet Take 200 mg by mouth 2 (two) times daily.    [provider]  buPROPion (WELLBUTRIN SR) 150 MG 12 hr tablet Take 150 mg by mouth 2 (two) times daily.    [provider]  buPROPion (WELLBUTRIN) 100 MG tablet Take 100 mg by mouth 2 (  two) times daily.    [provider]  calcitRIOL (ROCALTROL) 0.5 MCG capsule Take 0.5 mcg by mouth daily.    [provider]  calcium carbonate (OSCAL) 1500 (600 Ca) MG TABS tablet Take 1,500 mg by mouth 2 (two) times daily with a meal.    [provider]  clonazePAM (KLONOPIN) 0.5 MG tablet Take 0.5 mg by mouth as directed. .42m tablet 1-2 tablets in am, 1 tablet in afternoon, then 1 tablet at bedtime    [provider]  estradiol (ESTRACE) 1 MG tablet Take 1 mg by mouth daily.    [provider]  ferrous sulfate 325 (65 FE) MG tablet Take 325 mg by mouth daily with breakfast.    [provider]  fexofenadine (ALLEGRA) 60 MG tablet Take 1 tablet (60 mg total) by mouth 2 (two) times daily. 01/23/14   BMalvin Johns MD  gabapentin (NEURONTIN) 100 MG capsule Take 100 mg by mouth 3 (three)  times daily.    [provider]  HYDROcodone-acetaminophen (NORCO/VICODIN) 5-325 MG tablet Take 1-2 tablets by mouth every 6 (six) hours as needed. 04/26/20   Horton, CBarbette Hair MD  ibuprofen (ADVIL,MOTRIN) 600 MG tablet Take 1 tablet (600 mg total) by mouth every 6 (six) hours as needed. 11/27/14   NVarney Biles MD  insulin detemir (LEVEMIR) 100 UNIT/ML injection Inject into the skin daily.    [provider]  insulin glargine (LANTUS) 100 UNIT/ML injection Inject into the skin at bedtime.    [provider]  lamoTRIgine (LAMICTAL) 100 MG tablet Take 100 mg by mouth 2 (two) times daily.    [provider]  lamoTRIgine (LAMICTAL) 25 MG tablet Take 25 mg by mouth daily.    [provider]  metoprolol succinate (TOPROL-XL) 25 MG 24 hr tablet Take 25 mg by mouth daily.    [provider]  mirtazapine (REMERON) 15 MG tablet Take 15 mg by mouth at bedtime.    [provider]  mupirocin cream (BACTROBAN) 2 % Apply 1 application topically 2 (two) times daily. For 7 days 03/17/15   BMalvin Johns MD  ondansetron (ZOFRAN ODT) 4 MG disintegrating tablet 473mODT q4 hours prn nausea/vomit 10/02/18   FlDeno EtienneDO  oxybutynin (DITROPAN-XL) 10 MG 24 hr tablet Take 10 mg by mouth at bedtime.    [provider]  pantoprazole (PROTONIX) 40 MG tablet Take 40 mg by mouth daily.    [provider]  pravastatin (PRAVACHOL) 20 MG tablet Take 20 mg by mouth daily.    [provider]  pregabalin (LYRICA) 150 MG capsule Take 150 mg by mouth 2 (two) times daily.    [provider]  promethazine (PHENERGAN) 25 MG tablet Take 1 tablet (25 mg total) by mouth every 6 (six) hours as needed for nausea. 11/27/14   NaVarney BilesMD  QUEtiapine (SEROQUEL) 300 MG tablet Take 300 mg by mouth at bedtime.    [provider]  rizatriptan (MAXALT) 10 MG tablet Take 10 mg by mouth as needed for migraine. May repeat in 2 hours if  needed    [provider]  sevelamer carbonate (RENVELA) 800 MG tablet Take 800 mg by mouth 3 (three) times daily with meals.    [provider]  sitaGLIPtin-metformin (JANUMET) 50-1000 MG per tablet Take 1 tablet by mouth 2 (two) times daily with a meal.    [provider]  tiZANidine (ZANAFLEX) 4 MG tablet Take 4 mg by mouth every 6 (  six) hours as needed for muscle spasms.    [provider]   DG Chest 2 View  Result Date: 05/02/2020 CLINICAL DATA:  [place], recent left humeral fracture EXAM: CHEST - 2 VIEW COMPARISON:  04/25/2020 FINDINGS: Frontal and lateral views of the chest demonstrate displaced comminuted left humeral diaphyseal fracture, with increased displacement since prior study. Cardiac silhouette is unremarkable. No airspace disease, effusion, or pneumothorax. No new fractures. IMPRESSION: 1. Known displaced left humeral diaphyseal fracture, partially imaged on this study. Increased displacement since prior exam. 2. Otherwise no acute intrathoracic process. Electronically Signed   By: Randa Ngo M.D.   On: 05/02/2020 19:25   DG Shoulder Right  Result Date: 05/02/2020 CLINICAL DATA:  Right shoulder pain after fall EXAM: RIGHT SHOULDER - 2+ VIEW COMPARISON:  04/18/2020 FINDINGS: Frontal, transscapular, and axillary views of the right shoulder demonstrate no fracture, subluxation, or dislocation. Mild acromioclavicular and glenohumeral joint osteoarthritis. Right chest is clear. Soft tissues are normal. IMPRESSION: 1. Osteoarthritis.  No acute fracture. Electronically Signed   By: Randa Ngo M.D.   On: 05/02/2020 19:18   CT HEAD WO CONTRAST  Result Date: 05/03/2020 CLINICAL DATA:  Subdural hemorrhage follow-up EXAM: CT HEAD WITHOUT CONTRAST TECHNIQUE: Contiguous axial images were obtained from the base of the skull through the vertex without intravenous contrast. COMPARISON:  Head CT from yesterday FINDINGS: Brain: Subdural hematoma on both leaflets  of the tentorium, up to 5 mm in thickness on the right, unchanged. No associated mass effect. No interval hemorrhage or brain swelling. Negative for infarct or hydrocephalus. Vascular: Negative Skull: Scalp contusion on the right. No acute underlying fracture. Known nasal bone fracture. Sinuses/Orbits: No evidence of injury IMPRESSION: Size stable tentorial subdural hematoma without mass effect. No new abnormality. Electronically Signed   By: Monte Fantasia M.D.   On: 05/03/2020 07:53   CT Head Wo Contrast  Result Date: 05/02/2020 CLINICAL DATA:  Fall.  Hit head on plaster wall. EXAM: CT HEAD WITHOUT CONTRAST CT MAXILLOFACIAL WITHOUT CONTRAST CT CERVICAL SPINE WITHOUT CONTRAST TECHNIQUE: Multidetector CT imaging of the head, cervical spine, and maxillofacial structures were performed using the standard protocol without intravenous contrast. Multiplanar CT image reconstructions of the cervical spine and maxillofacial structures were also generated. COMPARISON:  CT head 01/21/2020 CT maxillofacial 08/04/2018. FINDINGS: CT HEAD FINDINGS Brain: There are acute bilateral subdural hematomas which extend along the tentorium, right greater than left. On the right this measures up to 6 mm in thickness. On the left this measures approximately 3 mm in maximum thickness. No signs of acute brain infarct or mass. Ventricular volumes are within normal limits. Vascular: No hyperdense vessel or unexpected calcification. Skull: Normal. Negative for fracture or focal lesion. Other: Right posterior parietal scalp laceration and hematoma is noted, image 62/3. CT MAXILLOFACIAL FINDINGS Osseous: Nondisplaced bilateral nasal bone fractures, image 48/3. No additional fractures identified. Orbits: Negative. No traumatic or inflammatory finding. Sinuses: Clear Soft tissues: Negative. CT CERVICAL SPINE FINDINGS Alignment: Alignment of the cervical spine appears normal. The facet joints appear well aligned. Skull base and vertebrae: No  acute fracture. No primary bone lesion or focal pathologic process. Soft tissues and spinal canal: No prevertebral fluid or swelling. No visible canal hematoma. Disc levels:  Within normal limits. Upper chest: Negative. Other: None IMPRESSION: 1. Acute bilateral subdural hematomas which extend along the tentorium, right greater than left. 2. Right posterior parietal scalp laceration and hematoma. 3. Nondisplaced bilateral nasal bone fractures. 4. No evidence for cervical spine fracture or dislocation.  5. Critical Value/emergent results were called by telephone at the time of interpretation on 05/02/2020 at 7:11 pm to provider Dr. Melina Copa, Who verbally acknowledged these results. Electronically Signed   By: Kerby Moors M.D.   On: 05/02/2020 19:12   CT Cervical Spine Wo Contrast  Result Date: 05/02/2020 CLINICAL DATA:  Fall.  Hit head on plaster wall. EXAM: CT HEAD WITHOUT CONTRAST CT MAXILLOFACIAL WITHOUT CONTRAST CT CERVICAL SPINE WITHOUT CONTRAST TECHNIQUE: Multidetector CT imaging of the head, cervical spine, and maxillofacial structures were performed using the standard protocol without intravenous contrast. Multiplanar CT image reconstructions of the cervical spine and maxillofacial structures were also generated. COMPARISON:  CT head 01/21/2020 CT maxillofacial 08/04/2018. FINDINGS: CT HEAD FINDINGS Brain: There are acute bilateral subdural hematomas which extend along the tentorium, right greater than left. On the right this measures up to 6 mm in thickness. On the left this measures approximately 3 mm in maximum thickness. No signs of acute brain infarct or mass. Ventricular volumes are within normal limits. Vascular: No hyperdense vessel or unexpected calcification. Skull: Normal. Negative for fracture or focal lesion. Other: Right posterior parietal scalp laceration and hematoma is noted, image 62/3. CT MAXILLOFACIAL FINDINGS Osseous: Nondisplaced bilateral nasal bone fractures, image 48/3. No  additional fractures identified. Orbits: Negative. No traumatic or inflammatory finding. Sinuses: Clear Soft tissues: Negative. CT CERVICAL SPINE FINDINGS Alignment: Alignment of the cervical spine appears normal. The facet joints appear well aligned. Skull base and vertebrae: No acute fracture. No primary bone lesion or focal pathologic process. Soft tissues and spinal canal: No prevertebral fluid or swelling. No visible canal hematoma. Disc levels:  Within normal limits. Upper chest: Negative. Other: None IMPRESSION: 1. Acute bilateral subdural hematomas which extend along the tentorium, right greater than left. 2. Right posterior parietal scalp laceration and hematoma. 3. Nondisplaced bilateral nasal bone fractures. 4. No evidence for cervical spine fracture or dislocation. 5. Critical Value/emergent results were called by telephone at the time of interpretation on 05/02/2020 at 7:11 pm to provider Dr. Melina Copa, Who verbally acknowledged these results. Electronically Signed   By: Kerby Moors M.D.   On: 05/02/2020 19:12   CT Maxillofacial WO CM  Result Date: 05/02/2020 CLINICAL DATA:  Fall.  Hit head on plaster wall. EXAM: CT HEAD WITHOUT CONTRAST CT MAXILLOFACIAL WITHOUT CONTRAST CT CERVICAL SPINE WITHOUT CONTRAST TECHNIQUE: Multidetector CT imaging of the head, cervical spine, and maxillofacial structures were performed using the standard protocol without intravenous contrast. Multiplanar CT image reconstructions of the cervical spine and maxillofacial structures were also generated. COMPARISON:  CT head 01/21/2020 CT maxillofacial 08/04/2018. FINDINGS: CT HEAD FINDINGS Brain: There are acute bilateral subdural hematomas which extend along the tentorium, right greater than left. On the right this measures up to 6 mm in thickness. On the left this measures approximately 3 mm in maximum thickness. No signs of acute brain infarct or mass. Ventricular volumes are within normal limits. Vascular: No hyperdense  vessel or unexpected calcification. Skull: Normal. Negative for fracture or focal lesion. Other: Right posterior parietal scalp laceration and hematoma is noted, image 62/3. CT MAXILLOFACIAL FINDINGS Osseous: Nondisplaced bilateral nasal bone fractures, image 48/3. No additional fractures identified. Orbits: Negative. No traumatic or inflammatory finding. Sinuses: Clear Soft tissues: Negative. CT CERVICAL SPINE FINDINGS Alignment: Alignment of the cervical spine appears normal. The facet joints appear well aligned. Skull base and vertebrae: No acute fracture. No primary bone lesion or focal pathologic process. Soft tissues and spinal canal: No prevertebral fluid or swelling. No visible  canal hematoma. Disc levels:  Within normal limits. Upper chest: Negative. Other: None IMPRESSION: 1. Acute bilateral subdural hematomas which extend along the tentorium, right greater than left. 2. Right posterior parietal scalp laceration and hematoma. 3. Nondisplaced bilateral nasal bone fractures. 4. No evidence for cervical spine fracture or dislocation. 5. Critical Value/emergent results were called by telephone at the time of interpretation on 05/02/2020 at 7:11 pm to provider Dr. Melina Copa, Who verbally acknowledged these results. Electronically Signed   By: Kerby Moors M.D.   On: 05/02/2020 19:12    Positive ROS: All other systems have been reviewed and were otherwise negative with the exception of those mentioned in the HPI and as above.  Objective: Labs cbc Recent Labs    05/02/20 1908 05/03/20 0625  WBC 7.9 5.8  HGB 7.7* 7.8*  HCT 24.4* 25.4*  PLT 154 152    Labs inflam No results for input(s): CRP in the last 72 hours.  Invalid input(s): ESR  Labs coag Recent Labs    05/02/20 1928  INR 1.1    Recent Labs    05/02/20 1908  NA 137  K 4.3  CL 103  CO2 22  GLUCOSE 127*  BUN 48*  CREATININE 3.28*  CALCIUM 8.2*    Physical Exam: Vitals:   05/03/20 0306 05/03/20 0757  BP: (!) 175/57  (!) 164/63  Pulse: 95 (!) 113  Resp: 12 19  Temp: 98.4 F (36.9 C) 97.9 F (36.6 C)  SpO2: 100% 100%   General: Alert, no acute distress.  Resting in bed Mental status: Alert and Oriented x3 Neurologic: Speech Clear and organized, no gross focal findings or movement disorder appreciated. Respiratory: No cyanosis, no use of accessory musculature Cardiovascular: No pedal edema GI: Abdomen is soft and non-tender, non-distended. Skin: Warm and dry.  Large amount of ecchymosis noted around the left arm and into the left chest.   Extremities: Warm and well perfused w/o edema. Left arm noted to have swelling but compartments are soft can compressible. 2+ radial pulse. NVI.  Psychiatric: Patient is competent for consent with normal mood and affect  MUSCULOSKELETAL:  Other extremities are atraumatic with painless ROM and NVI.  Assessment / Plan: Principal Problem:   Subdural hematoma (HCC) Active Problems:   Humerus fracture   Scalp laceration   Nasal bone fractures   Falls frequently     Weightbearing: NWB LUE Orthopedic device(s): Sling to LUE Showering: May shower with care to LUE.  VTE prophylaxis: Hold for today in anticipation for OR tomorrow Pain control: Per medical team Diet: May eat today. NPO after MN tonight for OR tomorrow.  Dr. Griffin Basil plans to take to the OR tomorrow for ORIF vs IMN of left humerus.   Rachael Fee PA-C 05/03/2020 9:11 AM

## 2020-05-03 NOTE — Care Plan (Signed)
Tina Ramos is a 64 y.o. female with medical history significant of liver cirrhosis, insulin-dependent type 2 diabetes, hyperlipidemia, gastroparesis, CKD stage IV presented to the ED after sustaining head injury from a fall. she has stage IV kidney disease and has a dialysis fistula in her arm. Head CT showing acute bilateral subdural hematomas which extend along the tentorium, right greater than left. Repeat CT scan stable hematoma. Neurosurgery recommended neurochecks. She is found to have displaced spiral fracture of left humerus,  Ortho consulted scheduled for open reduction and internal fixation tomorrow morning. Frequent falls she underwent EEG which was negative for seizures. Patient was seen and examined undergoing EEG.

## 2020-05-03 NOTE — Progress Notes (Addendum)
Patient arrived to floor via [facility]link from [facility] ED of [place], [place]. Pt was admitted to tele. Patient is A&Ox4, vitals are stable.  Call bell in reach, bed in lowest position, alarm on and floor mat in place.   Patient came to unit with a foley in place. Pt also has a sling to support the left arm from a previous fall. Pt has no breakdown of skin but does have a significant amount of bruising on the chest and left upper arm which is swollen. Pt also has 6 staples to the posterior (R) head.   No orders to release called [facility] and was advise to wait for MD to come assess patient before orders are put in.   Sharin Mons, RN

## 2020-05-04 ENCOUNTER — Inpatient Hospital Stay (HOSPITAL_COMMUNITY): Payer: Medicare Other

## 2020-05-04 ENCOUNTER — Inpatient Hospital Stay (HOSPITAL_COMMUNITY): Payer: Medicare Other | Admitting: Anesthesiology

## 2020-05-04 ENCOUNTER — Encounter (HOSPITAL_COMMUNITY)
Admission: EM | Disposition: A | Discharge status: Skilled Nursing Facility | Payer: Self-pay | Source: Home / Self Care | Attending: Family Medicine

## 2020-05-04 ENCOUNTER — Encounter (HOSPITAL_COMMUNITY): Payer: Self-pay | Admitting: Internal Medicine

## 2020-05-04 HISTORY — PX: HUMERUS IM NAIL: SHX1769

## 2020-05-04 LAB — CBC WITH DIFFERENTIAL/PLATELET
Abs Immature Granulocytes: 0.22 10*3/uL — ABNORMAL HIGH (ref 0.00–0.07)
Abs Immature Granulocytes: 0.33 10*3/uL — ABNORMAL HIGH (ref 0.00–0.07)
Basophils Absolute: 0.1 10*3/uL (ref 0.0–0.1)
Basophils Absolute: 0 10*3/uL (ref 0.0–0.1)
Basophils Relative: 1 %
Basophils Relative: 1 %
Eosinophils Absolute: 0.3 10*3/uL (ref 0.0–0.5)
Eosinophils Absolute: 0 10*3/uL (ref 0.0–0.5)
Eosinophils Relative: 3 %
Eosinophils Relative: 0 %
HCT: 28.1 % — ABNORMAL LOW (ref 36.0–46.0)
HCT: 25.4 % — ABNORMAL LOW (ref 36.0–46.0)
Hemoglobin: 8.7 g/dL — ABNORMAL LOW (ref 12.0–15.0)
Hemoglobin: 7.6 g/dL — ABNORMAL LOW (ref 12.0–15.0)
Immature Granulocytes: 2 %
Immature Granulocytes: 4 %
Lymphocytes Relative: 16 %
Lymphocytes Relative: 7 %
Lymphs Abs: 1.7 10*3/uL (ref 0.7–4.0)
Lymphs Abs: 0.6 10*3/uL — ABNORMAL LOW (ref 0.7–4.0)
MCH: 32 pg (ref 26.0–34.0)
MCH: 32.2 pg (ref 26.0–34.0)
MCHC: 31 g/dL (ref 30.0–36.0)
MCHC: 29.9 g/dL — ABNORMAL LOW (ref 30.0–36.0)
MCV: 103.3 fL — ABNORMAL HIGH (ref 80.0–100.0)
MCV: 107.6 fL — ABNORMAL HIGH (ref 80.0–100.0)
Monocytes Absolute: 1.1 10*3/uL — ABNORMAL HIGH (ref 0.1–1.0)
Monocytes Absolute: 0.3 10*3/uL (ref 0.1–1.0)
Monocytes Relative: 10 %
Monocytes Relative: 3 %
Neutro Abs: 7.1 10*3/uL (ref 1.7–7.7)
Neutro Abs: 6.9 10*3/uL (ref 1.7–7.7)
Neutrophils Relative %: 68 %
Neutrophils Relative %: 85 %
Platelets: 190 10*3/uL (ref 150–400)
Platelets: 190 10*3/uL (ref 150–400)
RBC: 2.72 MIL/uL — ABNORMAL LOW (ref 3.87–5.11)
RBC: 2.36 MIL/uL — ABNORMAL LOW (ref 3.87–5.11)
RDW: 14.6 % (ref 11.5–15.5)
RDW: 15.1 % (ref 11.5–15.5)
WBC: 10.4 10*3/uL (ref 4.0–10.5)
WBC: 8.2 10*3/uL (ref 4.0–10.5)
nRBC: 0.6 % — ABNORMAL HIGH (ref 0.0–0.2)
nRBC: 1.2 % — ABNORMAL HIGH (ref 0.0–0.2)

## 2020-05-04 LAB — GLUCOSE, CAPILLARY
Glucose-Capillary: 106 mg/dL — ABNORMAL HIGH (ref 70–99)
Glucose-Capillary: 136 mg/dL — ABNORMAL HIGH (ref 70–99)
Glucose-Capillary: 137 mg/dL — ABNORMAL HIGH (ref 70–99)
Glucose-Capillary: 139 mg/dL — ABNORMAL HIGH (ref 70–99)
Glucose-Capillary: 128 mg/dL — ABNORMAL HIGH (ref 70–99)

## 2020-05-04 LAB — CBC
HCT: 28.1 % — ABNORMAL LOW (ref 36.0–46.0)
Hemoglobin: 8.9 g/dL — ABNORMAL LOW (ref 12.0–15.0)
MCH: 33.2 pg (ref 26.0–34.0)
MCHC: 31.7 g/dL (ref 30.0–36.0)
MCV: 104.9 fL — ABNORMAL HIGH (ref 80.0–100.0)
Platelets: 191 10*3/uL (ref 150–400)
RBC: 2.68 MIL/uL — ABNORMAL LOW (ref 3.87–5.11)
RDW: 14.7 % (ref 11.5–15.5)
WBC: 10.6 10*3/uL — ABNORMAL HIGH (ref 4.0–10.5)
nRBC: 0.6 % — ABNORMAL HIGH (ref 0.0–0.2)

## 2020-05-04 LAB — BASIC METABOLIC PANEL
Anion gap: 14 (ref 5–15)
BUN: 34 mg/dL — ABNORMAL HIGH (ref 8–23)
CO2: 21 mmol/L — ABNORMAL LOW (ref 22–32)
Calcium: 8.2 mg/dL — ABNORMAL LOW (ref 8.9–10.3)
Chloride: 104 mmol/L (ref 98–111)
Creatinine, Ser: 2.66 mg/dL — ABNORMAL HIGH (ref 0.44–1.00)
GFR calc Af Amer: 21 mL/min — ABNORMAL LOW (ref >60)
GFR calc non Af Amer: 18 mL/min — ABNORMAL LOW (ref >60)
Glucose, Bld: 122 mg/dL — ABNORMAL HIGH (ref 70–99)
Potassium: 5.1 mmol/L (ref 3.5–5.1)
Sodium: 139 mmol/L (ref 135–145)

## 2020-05-04 LAB — MAGNESIUM: Magnesium: 2.1 mg/dL (ref 1.7–2.4)

## 2020-05-04 LAB — PHOSPHORUS: Phosphorus: 2.4 mg/dL — ABNORMAL LOW (ref 2.5–4.6)

## 2020-05-04 SURGERY — INSERTION, INTRAMEDULLARY ROD, HUMERUS
Anesthesia: General | Site: Arm Upper | Laterality: Left

## 2020-05-04 MED ORDER — PROPOFOL 10 MG/ML IV BOLUS
INTRAVENOUS | Status: DC | PRN
  Administered 2020-05-04: 130 mg via INTRAVENOUS

## 2020-05-04 MED ORDER — VANCOMYCIN HCL 1 G IV SOLR
INTRAVENOUS | Status: DC | PRN
  Administered 2020-05-04: 1000 mg via TOPICAL

## 2020-05-04 MED ORDER — FENTANYL CITRATE (PF) 100 MCG/2ML IJ SOLN: 25.0000 ug | INTRAMUSCULAR | Status: DC | PRN

## 2020-05-04 MED ORDER — SUCCINYLCHOLINE CHLORIDE 20 MG/ML IJ SOLN
INTRAMUSCULAR | Status: DC | PRN
  Administered 2020-05-04: 140 mg via INTRAVENOUS

## 2020-05-04 MED ORDER — OXYCODONE HCL 5 MG PO TABS
5.0000 mg | ORAL_TABLET | ORAL | Status: DC | PRN
  Administered 2020-05-10: 5 mg via ORAL
  Filled 2020-05-04: qty 1

## 2020-05-04 MED ORDER — MIDAZOLAM HCL 2 MG/2ML IJ SOLN: 0.5000 mg | Freq: Once | INTRAMUSCULAR | Status: AC

## 2020-05-04 MED ORDER — CHLORHEXIDINE GLUCONATE 0.12 % MT SOLN: 15.0000 mL | Freq: Once | OROMUCOSAL | Status: AC

## 2020-05-04 MED ORDER — FENTANYL CITRATE (PF) 100 MCG/2ML IJ SOLN
INTRAMUSCULAR | Status: AC
  Administered 2020-05-04: 50 ug via INTRAVENOUS
  Filled 2020-05-04: qty 2

## 2020-05-04 MED ORDER — SODIUM CHLORIDE 0.9 % IV SOLN: INTRAVENOUS | Status: DC

## 2020-05-04 MED ORDER — ORAL CARE MOUTH RINSE: 15.0000 mL | Freq: Once | OROMUCOSAL | Status: AC

## 2020-05-04 MED ORDER — ROPIVACAINE HCL 5 MG/ML IJ SOLN
INTRAMUSCULAR | Status: DC | PRN
  Administered 2020-05-04: 30 mL via PERINEURAL

## 2020-05-04 MED ORDER — CEFAZOLIN SODIUM-DEXTROSE 2-4 GM/100ML-% IV SOLN
INTRAVENOUS | Status: AC
  Filled 2020-05-04: qty 100

## 2020-05-04 MED ORDER — LIDOCAINE 2% (20 MG/ML) 5 ML SYRINGE
INTRAMUSCULAR | Status: AC
  Filled 2020-05-04: qty 5

## 2020-05-04 MED ORDER — ONDANSETRON HCL 4 MG PO TABS
4.0000 mg | ORAL_TABLET | Freq: Four times a day (QID) | ORAL | Status: DC | PRN
  Administered 2020-05-05 – 2020-05-10 (×3): 4 mg via ORAL
  Filled 2020-05-04 (×4): qty 1

## 2020-05-04 MED ORDER — ROCURONIUM BROMIDE 10 MG/ML (PF) SYRINGE
PREFILLED_SYRINGE | INTRAVENOUS | Status: DC | PRN
  Administered 2020-05-04: 50 mg via INTRAVENOUS

## 2020-05-04 MED ORDER — LABETALOL HCL 5 MG/ML IV SOLN
10.0000 mg | INTRAVENOUS | Status: DC | PRN
  Administered 2020-05-04 – 2020-05-05 (×2): 10 mg via INTRAVENOUS
  Filled 2020-05-04 (×2): qty 4

## 2020-05-04 MED ORDER — DEXAMETHASONE SODIUM PHOSPHATE 10 MG/ML IJ SOLN
INTRAMUSCULAR | Status: AC
  Filled 2020-05-04: qty 1

## 2020-05-04 MED ORDER — MIDAZOLAM HCL 2 MG/2ML IJ SOLN
INTRAMUSCULAR | Status: AC
  Filled 2020-05-04: qty 2

## 2020-05-04 MED ORDER — MIDAZOLAM HCL 2 MG/2ML IJ SOLN
INTRAMUSCULAR | Status: AC
  Administered 2020-05-04: 0.5 mg via INTRAVENOUS
  Filled 2020-05-04: qty 2

## 2020-05-04 MED ORDER — PHENYLEPHRINE HCL-NACL 10-0.9 MG/250ML-% IV SOLN
INTRAVENOUS | Status: DC | PRN
  Administered 2020-05-04: 25 ug/min via INTRAVENOUS

## 2020-05-04 MED ORDER — DOCUSATE SODIUM 100 MG PO CAPS
100.0000 mg | ORAL_CAPSULE | Freq: Two times a day (BID) | ORAL | Status: DC
  Administered 2020-05-04 – 2020-05-12 (×13): 100 mg via ORAL
  Filled 2020-05-04 (×15): qty 1

## 2020-05-04 MED ORDER — DEXAMETHASONE SODIUM PHOSPHATE 10 MG/ML IJ SOLN
INTRAMUSCULAR | Status: DC | PRN
  Administered 2020-05-04: 5 mg via INTRAVENOUS

## 2020-05-04 MED ORDER — ONDANSETRON HCL 4 MG/2ML IJ SOLN
INTRAMUSCULAR | Status: AC
  Filled 2020-05-04: qty 2

## 2020-05-04 MED ORDER — CLONIDINE HCL (ANALGESIA) 100 MCG/ML EP SOLN
EPIDURAL | Status: DC | PRN
Start: 2020-05-04 — End: 2020-05-04
  Administered 2020-05-04: 100 ug

## 2020-05-04 MED ORDER — ONDANSETRON HCL 4 MG/2ML IJ SOLN
4.0000 mg | Freq: Four times a day (QID) | INTRAMUSCULAR | Status: DC | PRN
  Administered 2020-05-04 – 2020-05-10 (×4): 4 mg via INTRAVENOUS
  Filled 2020-05-04 (×4): qty 2

## 2020-05-04 MED ORDER — VANCOMYCIN HCL 1000 MG IV SOLR
INTRAVENOUS | Status: AC
  Filled 2020-05-04: qty 1000

## 2020-05-04 MED ORDER — DIPHENHYDRAMINE HCL 12.5 MG/5ML PO ELIX: 12.5000 mg | ORAL_SOLUTION | ORAL | Status: DC | PRN

## 2020-05-04 MED ORDER — ROCURONIUM BROMIDE 10 MG/ML (PF) SYRINGE
PREFILLED_SYRINGE | INTRAVENOUS | Status: AC
  Filled 2020-05-04: qty 10

## 2020-05-04 MED ORDER — ALBUMIN HUMAN 5 % IV SOLN: INTRAVENOUS | Status: DC | PRN

## 2020-05-04 MED ORDER — SUCCINYLCHOLINE CHLORIDE 200 MG/10ML IV SOSY
PREFILLED_SYRINGE | INTRAVENOUS | Status: AC
  Filled 2020-05-04: qty 10

## 2020-05-04 MED ORDER — FENTANYL CITRATE (PF) 100 MCG/2ML IJ SOLN: 50.0000 ug | Freq: Once | INTRAMUSCULAR | Status: AC

## 2020-05-04 MED ORDER — HYDROMORPHONE HCL 1 MG/ML IJ SOLN
0.5000 mg | INTRAMUSCULAR | Status: DC | PRN
  Administered 2020-05-05 – 2020-05-10 (×6): 1 mg via INTRAVENOUS
  Filled 2020-05-04 (×6): qty 1

## 2020-05-04 MED ORDER — OXYCODONE HCL 5 MG PO TABS
10.0000 mg | ORAL_TABLET | ORAL | Status: DC | PRN
  Administered 2020-05-04 – 2020-05-12 (×10): 10 mg via ORAL
  Filled 2020-05-04 (×13): qty 2

## 2020-05-04 MED ORDER — CEFAZOLIN SODIUM-DEXTROSE 1-4 GM/50ML-% IV SOLN
1.0000 g | Freq: Four times a day (QID) | INTRAVENOUS | Status: AC
  Administered 2020-05-04 – 2020-05-05 (×3): 1 g via INTRAVENOUS
  Filled 2020-05-04 (×3): qty 50

## 2020-05-04 MED ORDER — FENTANYL CITRATE (PF) 250 MCG/5ML IJ SOLN
INTRAMUSCULAR | Status: AC
  Filled 2020-05-04: qty 5

## 2020-05-04 MED ORDER — 0.9 % SODIUM CHLORIDE (POUR BTL) OPTIME
TOPICAL | Status: DC | PRN
  Administered 2020-05-04: 1000 mL

## 2020-05-04 MED ORDER — PHENYLEPHRINE 40 MCG/ML (10ML) SYRINGE FOR IV PUSH (FOR BLOOD PRESSURE SUPPORT)
PREFILLED_SYRINGE | INTRAVENOUS | Status: DC | PRN
  Administered 2020-05-04 (×2): 80 ug via INTRAVENOUS

## 2020-05-04 MED ORDER — PROPOFOL 10 MG/ML IV BOLUS
INTRAVENOUS | Status: AC
  Filled 2020-05-04: qty 20

## 2020-05-04 MED ORDER — BISACODYL 5 MG PO TBEC: 5.0000 mg | DELAYED_RELEASE_TABLET | Freq: Every day | ORAL | Status: DC | PRN

## 2020-05-04 MED ORDER — PANTOPRAZOLE SODIUM 40 MG IV SOLR
40.0000 mg | Freq: Two times a day (BID) | INTRAVENOUS | Status: DC
  Administered 2020-05-04 – 2020-05-08 (×9): 40 mg via INTRAVENOUS
  Filled 2020-05-04 (×9): qty 40

## 2020-05-04 MED ORDER — PHENYLEPHRINE 40 MCG/ML (10ML) SYRINGE FOR IV PUSH (FOR BLOOD PRESSURE SUPPORT)
PREFILLED_SYRINGE | INTRAVENOUS | Status: AC
  Filled 2020-05-04: qty 10

## 2020-05-04 MED ORDER — SUGAMMADEX SODIUM 200 MG/2ML IV SOLN
INTRAVENOUS | Status: DC | PRN
  Administered 2020-05-04: 140 mg via INTRAVENOUS

## 2020-05-04 MED ORDER — CHLORHEXIDINE GLUCONATE 0.12 % MT SOLN
OROMUCOSAL | Status: AC
  Administered 2020-05-04: 15 mL via OROMUCOSAL
  Filled 2020-05-04: qty 15

## 2020-05-04 MED ORDER — POLYETHYLENE GLYCOL 3350 17 G PO PACK: 17.0000 g | PACK | Freq: Every day | ORAL | Status: DC | PRN

## 2020-05-04 MED ORDER — ONDANSETRON HCL 4 MG/2ML IJ SOLN
INTRAMUSCULAR | Status: DC | PRN
  Administered 2020-05-04: 4 mg via INTRAVENOUS

## 2020-05-04 SURGICAL SUPPLY — 71 items
AID PSTN UNV HD RSTRNT DISP (MISCELLANEOUS) ×1
BIT DRILL 10 HOLLOW (BIT) ×2 IMPLANT
BIT DRILL 3.8MMX270MM (BIT) ×1
BIT DRILL 3.8X270 (BIT) ×1 IMPLANT
BIT DRILL 6.5 CONICAL (BIT) ×1 IMPLANT
BIT DRILL 6.5MM CONICAL (BIT) ×1
BIT DRILL SHORT 3.2MM (DRILL) IMPLANT
CLOSURE STERI-STRIP 1/2X4 (GAUZE/BANDAGES/DRESSINGS) ×2
CLOSURE WOUND 1/2 X4 (GAUZE/BANDAGES/DRESSINGS) ×1
CLSR STERI-STRIP ANTIMIC 1/2X4 (GAUZE/BANDAGES/DRESSINGS) ×4 IMPLANT
COVER SURGICAL LIGHT HANDLE (MISCELLANEOUS) ×3 IMPLANT
COVER WAND RF STERILE (DRAPES) IMPLANT
DRAPE C-ARM 42X120 X-RAY (DRAPES) ×3 IMPLANT
DRAPE C-ARM 42X72 X-RAY (DRAPES) ×3 IMPLANT
DRAPE HALF SHEET 40X57 (DRAPES) ×3 IMPLANT
DRAPE INCISE IOBAN 66X45 STRL (DRAPES) IMPLANT
DRAPE ORTHO SPLIT 77X108 STRL (DRAPES) ×6
DRAPE SHEET LG 3/4 BI-LAMINATE (DRAPES) ×6 IMPLANT
DRAPE SURG ORHT 6 SPLT 77X108 (DRAPES) ×2 IMPLANT
DRAPE U-SHAPE 47X51 STRL (DRAPES) ×3 IMPLANT
DRESSING AQUACEL AG SP 3.5X10 (GAUZE/BANDAGES/DRESSINGS) IMPLANT
DRESSING AQUACEL AG SP 3.5X6 (GAUZE/BANDAGES/DRESSINGS) IMPLANT
DRILL SHORT 3.2MM (DRILL) ×3
DRSG ADAPTIC 3X8 NADH LF (GAUZE/BANDAGES/DRESSINGS) IMPLANT
DRSG AQUACEL AG ADV 3.5X 6 (GAUZE/BANDAGES/DRESSINGS) IMPLANT
DRSG AQUACEL AG ADV 3.5X10 (GAUZE/BANDAGES/DRESSINGS) IMPLANT
DRSG AQUACEL AG SP 3.5X10 (GAUZE/BANDAGES/DRESSINGS) ×3
DRSG AQUACEL AG SP 3.5X6 (GAUZE/BANDAGES/DRESSINGS) ×3
DRSG PAD ABDOMINAL 8X10 ST (GAUZE/BANDAGES/DRESSINGS) IMPLANT
DURAPREP 26ML APPLICATOR (WOUND CARE) ×3 IMPLANT
GAUZE SPONGE 4X4 12PLY STRL (GAUZE/BANDAGES/DRESSINGS) IMPLANT
GAUZE SPONGE 4X4 12PLY STRL LF (GAUZE/BANDAGES/DRESSINGS) ×3 IMPLANT
GAUZE XEROFORM 1X8 LF (GAUZE/BANDAGES/DRESSINGS) IMPLANT
GLOVE BIO SURGEON STRL SZ 6.5 (GLOVE) ×2 IMPLANT
GLOVE BIO SURGEONS STRL SZ 6.5 (GLOVE) ×1
GLOVE BIOGEL PI IND STRL 6.5 (GLOVE) ×1 IMPLANT
GLOVE BIOGEL PI IND STRL 8 (GLOVE) ×1 IMPLANT
GLOVE BIOGEL PI INDICATOR 6.5 (GLOVE) ×2
GLOVE BIOGEL PI INDICATOR 8 (GLOVE) ×2
GLOVE ECLIPSE 8.0 STRL XLNG CF (GLOVE) ×6 IMPLANT
GOWN STRL REUS W/TWL LRG LVL3 (GOWN DISPOSABLE) ×6 IMPLANT
GUIDEROD SS 2.5MMX280MM (ROD) ×4 IMPLANT
K-WIRE TROCAR POINT 2.5X280 (WIRE) ×3
KIT BASIN OR (CUSTOM PROCEDURE TRAY) ×3 IMPLANT
KIT STABILIZATION SHOULDER (MISCELLANEOUS) ×3 IMPLANT
KIT TURNOVER KIT A (KITS) IMPLANT
KWIRE TROCAR POINT 2.5X280 (WIRE) IMPLANT
NAIL CANN HUM ML 7X225 LT (Nail) ×2 IMPLANT
NS IRRIG 1000ML POUR BTL (IV SOLUTION) ×3 IMPLANT
PACK SHOULDER (CUSTOM PROCEDURE TRAY) ×3 IMPLANT
PAD ABD 8X10 STRL (GAUZE/BANDAGES/DRESSINGS) ×3 IMPLANT
PROTECTOR NERVE ULNAR (MISCELLANEOUS) ×3 IMPLANT
RESTRAINT HEAD UNIVERSAL NS (MISCELLANEOUS) ×3 IMPLANT
ROD REAMING 2.5 (INSTRUMENTS) ×2 IMPLANT
SCREW LOCK 4.5X38 TI FT (Screw) ×2 IMPLANT
SCREW LOCK 4X26 TI (Screw) ×2 IMPLANT
SCREW LOCK MULTILOC FT 4.5X30 (Screw) ×2 IMPLANT
STRIP CLOSURE SKIN 1/2X4 (GAUZE/BANDAGES/DRESSINGS) ×2 IMPLANT
SUT ETHIBOND NAB CT1 #1 30IN (SUTURE) IMPLANT
SUT FIBERWIRE #2 38 T-5 BLUE (SUTURE) ×3
SUT FIBERWIRE 2-0 18 17.9 3/8 (SUTURE)
SUT MNCRL AB 4-0 PS2 18 (SUTURE) ×3 IMPLANT
SUT VIC AB 0 CT1 36 (SUTURE) ×3 IMPLANT
SUT VIC AB 1 CT1 36 (SUTURE) ×3 IMPLANT
SUT VIC AB 2-0 CT1 27 (SUTURE) ×3
SUT VIC AB 2-0 CT1 TAPERPNT 27 (SUTURE) ×1 IMPLANT
SUT VIC AB 3-0 SH 27 (SUTURE) ×3
SUT VIC AB 3-0 SH 27X BRD (SUTURE) ×1 IMPLANT
SUTURE FIBERWR #2 38 T-5 BLUE (SUTURE) ×1 IMPLANT
SUTURE FIBERWR 2-0 18 17.9 3/8 (SUTURE) IMPLANT
WATER STERILE IRR 1000ML POUR (IV SOLUTION) ×3 IMPLANT

## 2020-05-04 NOTE — Anesthesia Postprocedure Evaluation (Signed)
Anesthesia Post Note  Patient: Tina Ramos  Procedure(s) Performed: LEFT INTRAMEDULLARY (IM) NAIL HUMERAL (Left Arm Upper)     Patient location during evaluation: PACU Anesthesia Type: General Level of consciousness: awake and alert Pain management: pain level controlled Vital Signs Assessment: post-procedure vital signs reviewed and stable Respiratory status: spontaneous breathing, nonlabored ventilation, respiratory function stable and patient connected to nasal cannula oxygen Cardiovascular status: blood pressure returned to baseline and stable Postop Assessment: no apparent nausea or vomiting Anesthetic complications: no   No complications documented.  Last Vitals:  Vitals:   05/04/20 1605 05/04/20 1620  BP: (!) 126/43 (!) 175/66  Pulse: (!) 115 (!) 115  Resp: 17 (!) 24  Temp: 36.8 C   SpO2: 96% 97%    Last Pain:  Vitals:   05/04/20 1605  TempSrc:   PainSc: 0-No pain                 Janei Scheff S

## 2020-05-04 NOTE — Interval H&P Note (Signed)
Again discussed case with patient. We are planning to do an intramedullary nail and will dissect distally at the level of her interlocking screws to avoid damage to her fistula if it all possible. 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 She like to proceed.

## 2020-05-04 NOTE — Op Note (Signed)
Orthopaedic Surgery Operative Note (CSN: [id number])  Tina Ramos  [birth year] Date of Surgery: 05/04/2020   Diagnoses:  Left comminuted humeral shaft fracture  Procedure: Left humeral shaft intramedullary nail fixation   Operative Finding Successful completion of the planned procedure.  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  We were able to minimally invasively fix her fracture and avoid damage to the structures.  Post-operative plan: The patient will be nonweightbearing in a sling for 6 weeks with pendulums start week 2 and active assistive range of motion starting week 3 with a therapist.  The patient will be readmitted to the floor.  DVT prophylaxis per primary team, no orthopedic contraindications.   Pain control with PRN pain medication preferring oral medicines.  Follow up plan will be scheduled in approximately 7 days for incision check and XR.  Post-Op Diagnosis: Same Surgeons:Primary: Hiram Gash, MD Assistants:Caroline McBane PA-C Location: [facility] OR ROOM 09 Anesthesia: General with regional anesthesia Antibiotics: Ancef 2 g with local vancomycin powder 1 g at the surgical site Tourniquet time: * No tourniquets in log * Estimated Blood Loss: Minimal Complications: None Specimens: None Implants: Implant Name Type Inv. Item Serial No. Manufacturer Lot No. LRB No. Used Action  7MM TI MULTILOC HUMERAL NAIL LEFT/CANN/225MM-STERILE    [employer] [id number] Left 1 Implanted  SCREW LOCK 4.5X38 TI FT - [id number] Screw SCREW LOCK 4.5X38 TI FT  [employer]  Left 1 Implanted  SCREW LOCK 4.5X30 TI FT - [id number] Screw SCREW LOCK 4.5X30 TI FT  [employer]  Left 1 Implanted  SCREW LOCK 4X26 TI - [id number] Screw SCREW LOCK 4X26 TI  [employer]  Left 1 Implanted    Indications for Surgery:   Tina Ramos is a 64 y.o. female with history of ESRD, and multiple medical  problems.  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  On CT scan her fracture is significantly distracted and varus and are worried that without fixation to go on to nonunion.  We are very worried about the possibility of an open approach as it may compromise her AV fistula and the extensive dissection may prove hard to heal.  Benefits and risks of operative and nonoperative management were discussed prior to surgery with patient/guardian(s) and informed consent form was completed.  Specific risks including infection, need for additional surgery, nonunion, malunion, hardware prominence and the need for further surgery amongst others   Procedure:   The patient was identified properly. Informed consent was obtained and the surgical site was marked. The patient was taken up to suite where general anesthesia was induced.  The patient was positioned on a Allen table in the beachchair position.  The left shoulder was prepped and draped in the usual sterile fashion.  Timeout was performed before the beginning of the case.  We began with a mini open approach to the anterolateral humerus using a 3 cm incision.  This was centered starting just proximal to the anterolateral corner of the acromion.  We went to skin sharply achieving hemostasis we progressed.  We identified the deltoid fascia and opened it in line with our incision in its fibers.  We able to split the fibers bluntly not detaching any of the humerus.  We did a bursectomy and identified that the cuff was intact.  We were able to split the cuff in line with its fibers  and identified the biceps tendon which was intact.  We retracted this out of the way.  At this point we used fluoroscopic imaging to place a starting pin at the most proximal aspect of the humerus on the articular margin central to the cuff insertion.  We placed a guidewire at this point and then overreamed with the entry reamer from the  [employer] set.    We used orthogonal images to reduce the fracture indirectly until it was adequately reduced.  Fracture was a long oblique fracture with a minimally displaced lateral butterfly.  That point were able to place a guidewire down the fracture and were able to align the fracture fragments.  We then selected a 225 mm nail and were able to place it without issue obtaining orthogonal images we progressed.  We did ream to an 8.5 to allow passage of the nail.  She had a relatively tight canal.  We were worried about damaging the distal bone.  We used the outrigger to place 2 proximal screws in typical fashion using percutaneously placed incisions and spreading through the deep surfaces down to bone.  We had good fixation of these 2concerns screws.  We placed 1 distal interlock with a mini open incision and perfect circle technique to avoid damage to the AV fistula.  Around the [place] films demonstrated no perforation of the joint surface and good fixation of the proximal distal aspects of the nail.  We irrigated copiously before closing the side to side rotator cuff split with #2 FiberWire.  We again irrigated before placing local vancomycin powder and closing the incision in a multilayer fashion with absorbable sutures.  Sterile dressing was placed and the patient was placed in a sling.  Patient awoken taken to PACU in stable condition.   Noemi Chapel, PA-C, present and scrubbed throughout the case, critical for completion in a timely fashion, and for retraction, instrumentation, closure.

## 2020-05-04 NOTE — Progress Notes (Signed)
Patient signed informed consent for surgery today. Patient did not have any questions about the surgery and agreed to sign the consent form.   Sharin Mons, RN

## 2020-05-04 NOTE — Progress Notes (Signed)
Pt having coffee ground appearing emesis.  Dr. Doristine Bosworth text paged to advise.

## 2020-05-04 NOTE — Anesthesia Preprocedure Evaluation (Addendum)
Anesthesia Evaluation  Patient identified by MRN, date of birth, ID band Patient awake    Reviewed: Allergy & Precautions, NPO status , Patient's Chart, lab work & pertinent test results  Airway Mallampati: II  TM Distance: >3 FB Neck ROM: Full    Dental no notable dental hx.    Pulmonary neg pulmonary ROS,    Pulmonary exam normal breath sounds clear to auscultation       Cardiovascular negative cardio ROS Normal cardiovascular exam Rhythm:Regular Rate:Normal     Neuro/Psych negative neurological ROS  negative psych ROS   GI/Hepatic negative GI ROS, Neg liver ROS,   Endo/Other  diabetes  Renal/GU Renal InsufficiencyRenal disease  negative genitourinary   Musculoskeletal negative musculoskeletal ROS (+)   Abdominal   Peds negative pediatric ROS (+)  Hematology  (+) anemia ,   Anesthesia Other Findings   Reproductive/Obstetrics negative OB ROS                             Anesthesia Physical Anesthesia Plan  ASA: III  Anesthesia Plan: General   Post-op Pain Management:  Regional for Post-op pain   Induction: Intravenous and Rapid sequence  PONV Risk Score and Plan: 3 and Ondansetron, Dexamethasone and Treatment may vary due to age or medical condition  Airway Management Planned: Oral ETT  Additional Equipment:   Intra-op Plan:   Post-operative Plan: Extubation in OR  Informed Consent: I have reviewed the patients History and Physical, chart, labs and discussed the procedure including the risks, benefits and alternatives for the proposed anesthesia with the patient or authorized representative who has indicated his/her understanding and acceptance.     Dental advisory given  Plan Discussed with: CRNA and Surgeon  Anesthesia Plan Comments:        Anesthesia Quick Evaluation

## 2020-05-04 NOTE — Anesthesia Procedure Notes (Signed)
Anesthesia Regional Block: Interscalene brachial plexus block   Pre-Anesthetic Checklist: ,, timeout performed, Correct Patient, Correct Site, Correct Laterality, Correct Procedure, Correct Position, site marked, Risks and benefits discussed,  Surgical consent,  Pre-op evaluation,  At surgeon's request and post-op pain management  Laterality: Left  Prep: chloraprep       Needles:  Injection technique: Single-shot  Needle Type: Echogenic Needle     Needle Length: 9cm      Additional Needles:   Procedures:,,,, ultrasound used (permanent image in chart),,,,  Narrative:  Start time: 05/04/2020 12:47 PM End time: 05/04/2020 12:54 PM Injection made incrementally with aspirations every 5 mL.  Performed by: Personally  Anesthesiologist: Myrtie Soman, MD  Additional Notes: Patient tolerated the procedure well without complications

## 2020-05-04 NOTE — Transfer of Care (Signed)
Immediate Anesthesia Transfer of Care Note  Patient: Tina Ramos  Procedure(s) Performed: LEFT INTRAMEDULLARY (IM) NAIL HUMERAL (Left Arm Upper)  Patient Location: PACU  Anesthesia Type:General and Regional  Level of Consciousness: awake, alert  and oriented  Airway & Oxygen Therapy: Patient Spontanous Breathing  Post-op Assessment: Report given to RN  Post vital signs: Reviewed and stable  Last Vitals:  Vitals Value Taken Time  BP 145/44 05/04/20 1524  Temp    Pulse 119 05/04/20 1525  Resp 24 05/04/20 1525  SpO2 97 % 05/04/20 1525  Vitals shown include unvalidated device data.  Last Pain:  Vitals:   05/04/20 1200  TempSrc:   PainSc: 0-No pain      Patients Stated Pain Goal: 0 (90/93/11 2162)  Complications: No complications documented.

## 2020-05-04 NOTE — Anesthesia Procedure Notes (Signed)
Anesthesia Procedure Image    

## 2020-05-04 NOTE — Progress Notes (Signed)
PROGRESS NOTE    Tina Ramos  FGH:[id number] DOB: 10-21-1955 DOA: 05/02/2020 PCP: [facility]   Brief Narrative:  HPI: Tina Ramos is a 64 y.o. female with medical history significant of liver cirrhosis, insulin-dependent type 2 diabetes, hyperlipidemia, gastroparesis, CKD stage IV presented to the ED after sustaining head injury from a fall.  Patient was recently evaluated in the ED on 8/9 after a fall and was diagnosed with a mildly displaced spiral fracture of the proximal diaphysis of the left humerus.  Patient states she has not been able to see orthopedics yet.  States her arm is extremely swollen and it feels like it will burst.  She is having severe pain in the arm.  Also reports having numbness and tingling in her fingers in the same extremity.  States she has stage IV kidney disease and has a dialysis fistula in this arm.  States she has had multiple falls in the past 2 weeks.  On average she is following 4-5 times a day.  Each time she describes it as just falling on the ground all of a sudden.  States yesterday she went to get boost from the refrigerator and as soon as she closed the refrigerator door she fell on the ground.  She does not recall tripping over an object or losing her balance.  Denies preceding lightheadedness/dizziness, chest pain, shortness of breath, or palpitations.  States she hit her head and could feel warm blood going down her face.  Denies history of seizures.  Patient states her 6 year old father takes care of her and is concerned that she is falling so frequently.  She takes a baby aspirin daily.  ED Course: Vital signs stable in the ED.  Labs showing no leukocytosis.  Hemoglobin 7.7, MCV 103.8.  Hemoglobin was 10.4 on labs done in March 2021.  Creatinine 3.2, stable compared to labs done 5 months ago.  INR 1.1.  SARS-CoV-2 PCR test negative.  UA not suggestive of infection.  MRSA PCR screen negative.  Head CT showing acute bilateral  subdural hematomas which extend along the tentorium, right greater than left.  Right posterior parietal scalp laceration and hematoma.    CT maxillofacial showing nondisplaced bilateral nasal bone fractures.  CT C-spine negative for acute abnormality.  X-ray of right shoulder negative for acute fracture or dislocation.  Chest x-ray showing no acute intrathoracic process.  Showing known displaced left humeral diaphyseal fracture, partially imaged on the study.  Increased displacement since prior exam.  ED provider discussed the case with Dr. Trenton Gammon from neurosurgery who felt that the subdural hematomas were small.  He recommended neurochecks and potential discharge from the ED.  Admission requested as it is not safe for the patient to go home given frequency of falls.  Will need PT and OT evaluation.  Scalp laceration was repaired in the ED.  Foley catheter was placed as patient does self-catheterization at home.  Patient received fentanyl, IV fluid, and Tdap booster  Assessment & Plan:   Principal Problem:   Subdural hematoma (HCC) Active Problems:   Humerus fracture   Scalp laceration   Nasal bone fractures   Falls frequently   Acute bilateral subdural hematomas secondary to a fall: Patient takes aspirin 81 mg daily at home.  Head CT showing acute bilateral subdural hematomas which extend along the tentorium, right greater than left.  ED provider discussed the case with neurosurgery and and it was felt that the hematomas were small and neurosurgery in fact  recommended discharging patient home however due to frequent falls, she was admitted under hospital service.  There is no consultation note from neurosurgery.  Per H&P from hospitalist, plan was to repeat CT head however there is no CT head ordered.  Patient is currently in surgery.  I will repeat her CT tomorrow.  Displaced spiral fracture of the proximal diaphysis of the left humerus: Diagnosed after a fall on 8/9.   Imaging done this hospitalization showing increased displacement.  There is concern for possible compartment syndrome since the left upper extremity is extremely swollen and patient with complaints of severe pain and paresthesias.  She was evaluated by orthopedics.  They were not concerned about compartment syndrome.  She is currently having the surgery for this.  Scalp laceration -Repaired in the ED. Tdap booster was given.  Nondisplaced bilateral nasal bone fractures: Seen on CT. ENT follow-up as outpatient.  Frequent falls: Etiology unknown.  Likely due to deconditioning.  No event on telemetry noted so far.  Echo with normal ejection fraction and no wall motion abnormality and no valvular heart disease.  EEG pending.  We will continue to monitor on telemetry.  Will be seen by PT OT after the surgery tomorrow.  Acute on chronic anemia: Hemoglobin 7.7, MCV 103.8.  Hemoglobin was 10.4 on labs done in March 2021.  Patient is not endorsing any symptoms of GI bleed but she tells me that she bruises easily because it " a woman's skin".  Does have significant ecchymosis to her chest wall and left upper extremity from her recent fall which could possibly be contributing to the dropping hemoglobin to some degree.  Hemoglobin actually has improved.  Hyperlipidemia: Will resume atorvastatin tomorrow.  Essential hypertension: Blood pressure elevated. -Continue hydralazine PRN SBP >170.  We will add labetalol as needed as well.  She does not seem to be on any scheduled home medications prior to hospitalization.  If remains elevated persistently, will add amlodipine.  Insulin-dependent type 2 diabetes: Hemoglobin A1c 6.0.  Controlled blood sugar now.  Continue SSI.  Mood disorder: -Resume home medications after pharmacy med rec is done  CKD stage IV: Renal function at baseline. -Resume home Renvela after pharmacy med rec is done  An episode of black tarry vomiting: I was informed few hours  after I saw patient in person by RN that patient had black tarry vomiting.  Unfortunately, sample was not sent for occult blood.  I ordered a stat CBC.  Her hemoglobin was 8.7, was 8.9 this morning.  Will monitor for symptoms.  I started her on Protonix daily.  DVT prophylaxis: SCDs Start: 05/03/20 0325   Code Status: Full Code  Family Communication: None present at bedside.  Plan of care discussed with patient in length and she verbalized understanding and agreed with it.  Status is: Inpatient  Remains inpatient appropriate because:Inpatient level of care appropriate due to severity of illness   Dispo: The patient is from: Home              Anticipated d/c is to: SNF              Anticipated d/c date is: 3 days              Patient currently is not medically stable to d/c.        Estimated body mass index is 25.06 kg/m as calculated from the following:   Height as of 04/25/20: 5\' 2"  (1.575 m).   Weight as of 04/25/20: 62.1  kg.      Nutritional status:               Consultants:   Orthopedics  Procedures:   None  Antimicrobials:  Anti-infectives (From admission, onward)   Start     Dose/Rate Route Frequency Ordered Stop   05/04/20 1349  vancomycin (VANCOCIN) powder     Discontinue       As needed 05/04/20 1349     05/04/20 1314  ceFAZolin (ANCEF) 2-4 GM/100ML-% IVPB       Note to Pharmacy: Laurita Quint   : cabinet override      05/04/20 1314 05/05/20 0129   05/04/20 1000  ceFAZolin (ANCEF) IVPB 2g/100 mL premix        2 g 200 mL/hr over 30 Minutes Intravenous  Once 05/03/20 1316 05/04/20 0949         Subjective: Seen and examined.  She had no complaint other than left upper arm pain.  Alert and oriented.  Objective: Vitals:   05/03/20 2303 05/04/20 0459 05/04/20 0800 05/04/20 1136  BP: (!) 187/63 (!) 196/56 (!) 192/61 (!) 180/57  Pulse: 100 (!) 102 (!) 124 (!) 123  Resp: 16 17 20 16   Temp: 98 F (36.7 C) 98.9 F (37.2 C) 98.4 F (36.9 C) 98.3  F (36.8 C)  TempSrc: Oral Oral Oral Oral  SpO2: 99% 96% 98% 99%    Intake/Output Summary (Last 24 hours) at 05/04/2020 1442 Last data filed at 05/04/2020 1439 Gross per 24 hour  Intake 400 ml  Output 1900 ml  Net -1500 ml   There were no vitals filed for this visit.  Examination:  General exam: Appears calm and comfortable  Respiratory system: Clear to auscultation. Respiratory effort normal. Cardiovascular system: S1 & S2 heard, RRR. No JVD, murmurs, rubs, gallops or clicks. No pedal edema. Gastrointestinal system: Abdomen is nondistended, soft and nontender. No organomegaly or masses felt. Normal bowel sounds heard. Central nervous system: Alert and oriented. No focal neurological deficits. Extremities: Excruciating tenderness on the left upper extremity with all sorts of movement.  Edematous and large bruise. Skin: Bruise on the left upper chest and left upper extremity. Psychiatry: Judgement and insight appear normal. Mood & affect appropriate.    Data Reviewed: I have personally reviewed following labs and imaging studies  CBC: Recent Labs  Lab 05/02/20 1908 05/03/20 0625 05/03/20 1635 05/04/20 0810 05/04/20 1200  WBC 7.9 5.8  --  10.6* 10.4  NEUTROABS 5.7  --   --   --  7.1  HGB 7.7* 7.8* 9.6* 8.9* 8.7*  HCT 24.4* 25.4* 31.0* 28.1* 28.1*  MCV 103.8* 103.7*  --  104.9* 103.3*  PLT 154 152  --  191 440   Basic Metabolic Panel: Recent Labs  Lab 05/02/20 1908 05/04/20 0810  NA 137 139  K 4.3 5.1  CL 103 104  CO2 22 21*  GLUCOSE 127* 122*  BUN 48* 34*  CREATININE 3.28* 2.66*  CALCIUM 8.2* 8.2*  MG  --  2.1  PHOS  --  2.4*   GFR: Estimated Creatinine Clearance: 18.3 mL/min (A) (by C-G formula based on SCr of 2.66 mg/dL (H)). Liver Function Tests: Recent Labs  Lab 05/02/20 1908  AST 29  ALT 17  ALKPHOS 60  BILITOT 0.7  PROT 6.7  ALBUMIN 3.6   No results for input(s): LIPASE, AMYLASE in the last 168 hours. No results for input(s): AMMONIA in the  last 168 hours. Coagulation Profile: Recent Labs  Lab 05/02/20  1928  INR 1.1   Cardiac Enzymes: No results for input(s): CKTOTAL, CKMB, CKMBINDEX, TROPONINI in the last 168 hours. BNP (last 3 results) No results for input(s): PROBNP in the last 8760 hours. HbA1C: Recent Labs    05/03/20 0625  HGBA1C 6.0*   CBG: Recent Labs  Lab 05/03/20 1110 05/03/20 1634 05/03/20 2251 05/04/20 0758 05/04/20 1133  GLUCAP 113* 123* 87 106* 136*   Lipid Profile: No results for input(s): CHOL, HDL, LDLCALC, TRIG, CHOLHDL, LDLDIRECT in the last 72 hours. Thyroid Function Tests: No results for input(s): TSH, T4TOTAL, FREET4, T3FREE, THYROIDAB in the last 72 hours. Anemia Panel: Recent Labs    05/03/20 0625  VITAMINB12 1,856*  FOLATE 11.9  FERRITIN 151  TIBC 325  IRON 55  RETICCTPCT 2.8   Sepsis Labs: No results for input(s): PROCALCITON, LATICACIDVEN in the last 168 hours.  Recent Results (from the past 240 hour(s))  SARS Coronavirus 2 by RT PCR (hospital order, performed in [facility] hospital lab) Nasopharyngeal Nasopharyngeal Swab     Status: None   Collection Time: 05/02/20  7:28 PM   Specimen: Nasopharyngeal Swab  Result Value Ref Range Status   SARS Coronavirus 2 NEGATIVE NEGATIVE Final    Comment: (NOTE) SARS-CoV-2 target nucleic acids are NOT DETECTED.  The SARS-CoV-2 RNA is generally detectable in upper and lower respiratory specimens during the acute phase of infection. The lowest concentration of SARS-CoV-2 viral copies this assay can detect is 250 copies / mL. A negative result does not preclude SARS-CoV-2 infection and should not be used as the sole basis for treatment or other patient management decisions.  A negative result may occur with improper specimen collection / handling, submission of specimen other than nasopharyngeal swab, presence of viral mutation(s) within the areas targeted by this assay, and inadequate number of viral copies (<250 copies / mL).  A negative result must be combined with clinical observations, patient history, and epidemiological information.  Fact Sheet for Patients:   StrictlyIdeas.no  Fact Sheet for Healthcare Providers: BankingDealers.co.za  This test is not yet approved or  cleared by the [place] FDA and has been authorized for detection and/or diagnosis of SARS-CoV-2 by FDA under an Emergency Use Authorization (EUA).  This EUA will remain in effect (meaning this test can be used) for the duration of the COVID-19 declaration under Section 564(b)(1) of the Act, 21 U.S.C. section 360bbb-3(b)(1), unless the authorization is terminated or revoked sooner.  Performed at [facility], [address] [place], [place] [address]   MRSA PCR Screening     Status: None   Collection Time: 05/02/20 11:40 PM   Specimen: Nasal Mucosa; Nasopharyngeal  Result Value Ref Range Status   MRSA by PCR NEGATIVE NEGATIVE Final    Comment:        The GeneXpert MRSA Assay (FDA approved for NASAL specimens only), is one component of a comprehensive MRSA colonization surveillance program. It is not intended to diagnose MRSA infection nor to guide or monitor treatment for MRSA infections. Performed at [facility], [place] [address], [place], [place] [address]       Radiology Studies: DG Chest 2 View  Result Date: 05/02/2020 CLINICAL DATA:  [place], recent left humeral fracture EXAM: CHEST - 2 VIEW COMPARISON:  04/25/2020 FINDINGS: Frontal and lateral views of the chest demonstrate displaced comminuted left humeral diaphyseal fracture, with increased displacement since prior study. Cardiac silhouette is unremarkable. No airspace disease, effusion, or pneumothorax. No new fractures. IMPRESSION:  1. Known displaced left humeral diaphyseal fracture, partially imaged on this study. Increased displacement since prior exam. 2. Otherwise no acute intrathoracic process.  Electronically Signed   By: Randa Ngo M.D.   On: 05/02/2020 19:25   DG Shoulder Right  Result Date: 05/02/2020 CLINICAL DATA:  Right shoulder pain after fall EXAM: RIGHT SHOULDER - 2+ VIEW COMPARISON:  04/18/2020 FINDINGS: Frontal, transscapular, and axillary views of the right shoulder demonstrate no fracture, subluxation, or dislocation. Mild acromioclavicular and glenohumeral joint osteoarthritis. Right chest is clear. Soft tissues are normal. IMPRESSION: 1. Osteoarthritis.  No acute fracture. Electronically Signed   By: Randa Ngo M.D.   On: 05/02/2020 19:18   CT HEAD WO CONTRAST  Result Date: 05/03/2020 CLINICAL DATA:  Subdural hemorrhage follow-up EXAM: CT HEAD WITHOUT CONTRAST TECHNIQUE: Contiguous axial images were obtained from the base of the skull through the vertex without intravenous contrast. COMPARISON:  Head CT from yesterday FINDINGS: Brain: Subdural hematoma on both leaflets of the tentorium, up to 5 mm in thickness on the right, unchanged. No associated mass effect. No interval hemorrhage or brain swelling. Negative for infarct or hydrocephalus. Vascular: Negative Skull: Scalp contusion on the right. No acute underlying fracture. Known nasal bone fracture. Sinuses/Orbits: No evidence of injury IMPRESSION: Size stable tentorial subdural hematoma without mass effect. No new abnormality. Electronically Signed   By: Monte Fantasia M.D.   On: 05/03/2020 07:53   CT Head Wo Contrast  Result Date: 05/02/2020 CLINICAL DATA:  Fall.  Hit head on plaster wall. EXAM: CT HEAD WITHOUT CONTRAST CT MAXILLOFACIAL WITHOUT CONTRAST CT CERVICAL SPINE WITHOUT CONTRAST TECHNIQUE: Multidetector CT imaging of the head, cervical spine, and maxillofacial structures were performed using the standard protocol without intravenous contrast. Multiplanar CT image reconstructions of the cervical spine and maxillofacial structures were also generated. COMPARISON:  CT head 01/21/2020 CT maxillofacial  08/04/2018. FINDINGS: CT HEAD FINDINGS Brain: There are acute bilateral subdural hematomas which extend along the tentorium, right greater than left. On the right this measures up to 6 mm in thickness. On the left this measures approximately 3 mm in maximum thickness. No signs of acute brain infarct or mass. Ventricular volumes are within normal limits. Vascular: No hyperdense vessel or unexpected calcification. Skull: Normal. Negative for fracture or focal lesion. Other: Right posterior parietal scalp laceration and hematoma is noted, image 62/3. CT MAXILLOFACIAL FINDINGS Osseous: Nondisplaced bilateral nasal bone fractures, image 48/3. No additional fractures identified. Orbits: Negative. No traumatic or inflammatory finding. Sinuses: Clear Soft tissues: Negative. CT CERVICAL SPINE FINDINGS Alignment: Alignment of the cervical spine appears normal. The facet joints appear well aligned. Skull base and vertebrae: No acute fracture. No primary bone lesion or focal pathologic process. Soft tissues and spinal canal: No prevertebral fluid or swelling. No visible canal hematoma. Disc levels:  Within normal limits. Upper chest: Negative. Other: None IMPRESSION: 1. Acute bilateral subdural hematomas which extend along the tentorium, right greater than left. 2. Right posterior parietal scalp laceration and hematoma. 3. Nondisplaced bilateral nasal bone fractures. 4. No evidence for cervical spine fracture or dislocation. 5. Critical Value/emergent results were called by telephone at the time of interpretation on 05/02/2020 at 7:11 pm to provider Dr. Melina Copa, Who verbally acknowledged these results. Electronically Signed   By: Kerby Moors M.D.   On: 05/02/2020 19:12   CT Cervical Spine Wo Contrast  Result Date: 05/02/2020 CLINICAL DATA:  Fall.  Hit head on plaster wall. EXAM: CT HEAD WITHOUT CONTRAST CT MAXILLOFACIAL WITHOUT CONTRAST CT CERVICAL SPINE  WITHOUT CONTRAST TECHNIQUE: Multidetector CT imaging of the head,  cervical spine, and maxillofacial structures were performed using the standard protocol without intravenous contrast. Multiplanar CT image reconstructions of the cervical spine and maxillofacial structures were also generated. COMPARISON:  CT head 01/21/2020 CT maxillofacial 08/04/2018. FINDINGS: CT HEAD FINDINGS Brain: There are acute bilateral subdural hematomas which extend along the tentorium, right greater than left. On the right this measures up to 6 mm in thickness. On the left this measures approximately 3 mm in maximum thickness. No signs of acute brain infarct or mass. Ventricular volumes are within normal limits. Vascular: No hyperdense vessel or unexpected calcification. Skull: Normal. Negative for fracture or focal lesion. Other: Right posterior parietal scalp laceration and hematoma is noted, image 62/3. CT MAXILLOFACIAL FINDINGS Osseous: Nondisplaced bilateral nasal bone fractures, image 48/3. No additional fractures identified. Orbits: Negative. No traumatic or inflammatory finding. Sinuses: Clear Soft tissues: Negative. CT CERVICAL SPINE FINDINGS Alignment: Alignment of the cervical spine appears normal. The facet joints appear well aligned. Skull base and vertebrae: No acute fracture. No primary bone lesion or focal pathologic process. Soft tissues and spinal canal: No prevertebral fluid or swelling. No visible canal hematoma. Disc levels:  Within normal limits. Upper chest: Negative. Other: None IMPRESSION: 1. Acute bilateral subdural hematomas which extend along the tentorium, right greater than left. 2. Right posterior parietal scalp laceration and hematoma. 3. Nondisplaced bilateral nasal bone fractures. 4. No evidence for cervical spine fracture or dislocation. 5. Critical Value/emergent results were called by telephone at the time of interpretation on 05/02/2020 at 7:11 pm to provider Dr. Melina Copa, Who verbally acknowledged these results. Electronically Signed   By: Kerby Moors M.D.   On:  05/02/2020 19:12   CT SHOULDER LEFT WO CONTRAST  Result Date: 05/03/2020 CLINICAL DATA:  The patient suffered a left humerus fracture in a fall 04/25/2020. Subsequent encounter. EXAM: CT OF THE UPPER LEFT EXTREMITY WITHOUT CONTRAST TECHNIQUE: Multidetector CT imaging of the upper left extremity was performed according to the standard protocol. COMPARISON:  Plain films of the left humerus 04/25/2020. FINDINGS: Bones/Joint/Cartilage As seen on the comparison examination, the patient has a spiral fracture of the proximal humerus. The most superior fracture line is 5 cm below the top of the humeral head and nondisplaced. Approximately 7.5 cm below the top of the humeral head, the fracture is displaced with fragment override of approximately 1.6 cm and almost 1 shaft width medial displacement of the distal fragment. There is also approximately 30 degrees dorsal angulation of the distal fragment. The fracture exits the diaphysis of the humerus approximately 17 cm below the top of the humeral head. The humeral head is located and the acromioclavicular joint is intact. Mild acromioclavicular osteoarthritis is present. Multilevel cervical facet degenerative disease is partially imaged. Ligaments Suboptimally assessed by CT. Muscles and Tendons There is some hematoma formation about the patient's fracture. Soft tissues Soft tissue contusion about the fracture noted. IMPRESSION: Acute spiral fracture of the diaphysis of the left humerus as described. Electronically Signed   By: Inge Rise M.D.   On: 05/03/2020 10:46   EEG adult  Result Date: 05/03/2020 Lora Havens, MD     05/03/2020 12:30 PM Patient Name: AYN DOMANGUE MRN: [id number] Epilepsy Attending: Lora Havens Referring Physician/Provider: Dr. Shela Leff Date: 8/70/2021 Duration: 26.19 mins Patient history: 32 old female with multiple episodes of fall/syncope.  EEG evaluate for seizures. Level of alertness: Awake AEDs during EEG study:  None Technical aspects: This EEG study  was done with scalp electrodes positioned according to the 10-20 International system of electrode placement. Electrical activity was acquired at a sampling rate of 500Hz  and reviewed with a high frequency filter of 70Hz  and a low frequency filter of 1Hz . EEG data were recorded continuously and digitally stored. Description: The posterior dominant rhythm consists of 7.5 Hz activity of moderate voltage (25-35 uV) seen predominantly in posterior head regions, symmetric and reactive to eye opening and eye closing. Hyperventilation and photic stimulation were not performed.   IMPRESSION: This study is within normal limits. No seizures or epileptiform discharges were seen throughout the recording. Lora Havens   ECHOCARDIOGRAM COMPLETE  Result Date: 05/03/2020    ECHOCARDIOGRAM REPORT   Patient Name:   Tina Ramos [facility] Date of Exam: 05/03/2020 Medical Rec #:  [id number]        Height:       62.0 in Accession #:    [id number]       Weight:       137.0 lb Date of Birth:  16-Nov-1955        BSA:          1.628 m Patient Age:    5 years         BP:           164/63 mmHg Patient Gender: F                HR:           108 bpm. Exam Location:  Inpatient Procedure: 2D Echo, Cardiac Doppler, Color Doppler and Intracardiac            Opacification Agent Indications:    R55 Syncope  History:        Patient has no prior history of Echocardiogram examinations.                 Risk Factors:Dyslipidemia and Diabetes.  Sonographer:    Jonelle Sidle Dance Referring Phys: [id number] [place]  1. Left ventricular ejection fraction, by estimation, is 65 to 70%. The left ventricle has normal function. The left ventricle has no regional wall motion abnormalities. There is mild left ventricular hypertrophy. Left ventricular diastolic parameters were normal.  2. Right ventricular systolic function is normal. The right ventricular size is normal. Tricuspid regurgitation signal is  inadequate for assessing PA pressure.  3. The mitral valve is normal in structure. Trivial mitral valve regurgitation.  4. The aortic valve is tricuspid. Aortic valve regurgitation is not visualized. Mild aortic valve sclerosis is present, with no evidence of aortic valve stenosis.  5. The inferior vena cava is normal in size with greater than 50% respiratory variability, suggesting right atrial pressure of 3 mmHg. FINDINGS  Left Ventricle: Left ventricular ejection fraction, by estimation, is 65 to 70%. The left ventricle has normal function. The left ventricle has no regional wall motion abnormalities. Definity contrast agent was given IV to delineate the left ventricular  endocardial borders. The left ventricular internal cavity size was normal in size. There is mild left ventricular hypertrophy. Left ventricular diastolic parameters were normal. Right Ventricle: The right ventricular size is normal. Right vetricular wall thickness was not assessed. Right ventricular systolic function is normal. Tricuspid regurgitation signal is inadequate for assessing PA pressure. Left Atrium: Left atrial size was normal in size. Right Atrium: Right atrial size was normal in size. Pericardium: There is no evidence of pericardial effusion. Mitral Valve: The mitral valve is normal in structure. Trivial mitral valve regurgitation.  Tricuspid Valve: The tricuspid valve is normal in structure. Tricuspid valve regurgitation is trivial. Aortic Valve: The aortic valve is tricuspid. Aortic valve regurgitation is not visualized. Mild aortic valve sclerosis is present, with no evidence of aortic valve stenosis. Pulmonic Valve: The pulmonic valve was not well visualized. Pulmonic valve regurgitation is not visualized. Aorta: The aortic root and ascending aorta are structurally normal, with no evidence of dilitation. Venous: The inferior vena cava is normal in size with greater than 50% respiratory variability, suggesting right atrial  pressure of 3 mmHg. IAS/Shunts: No atrial level shunt detected by color flow Doppler.  LEFT VENTRICLE PLAX 2D LVIDd:         3.80 cm  Diastology LVIDs:         2.70 cm  LV e' lateral:   6.53 cm/s LV PW:         1.20 cm  LV E/e' lateral: 13.3 LV IVS:        1.10 cm  LV e' medial:    7.15 cm/s LVOT diam:     1.70 cm  LV E/e' medial:  12.1 LV SV:         69 LV SV Index:   43 LVOT Area:     2.27 cm  RIGHT VENTRICLE             IVC RV Basal diam:  2.00 cm     IVC diam: 1.60 cm RV S prime:     14.30 cm/s TAPSE (M-mode): 1.9 cm LEFT ATRIUM             Index       RIGHT ATRIUM          Index LA diam:        4.10 cm 2.52 cm/m  RA Area:     7.09 cm LA Vol (A2C):   39.7 ml 24.39 ml/m RA Volume:   11.50 ml 7.06 ml/m LA Vol (A4C):   42.0 ml 25.80 ml/m LA Biplane Vol: 41.9 ml 25.74 ml/m  AORTIC VALVE LVOT Vmax:   117.00 cm/s LVOT Vmean:  72.000 cm/s LVOT VTI:    0.305 m  AORTA Ao Root diam: 3.10 cm Ao Asc diam:  2.60 cm MITRAL VALVE MV Area (PHT): 3.48 cm     SHUNTS MV Decel Time: 218 msec     Systemic VTI:  0.30 m MV E velocity: 86.60 cm/s   Systemic Diam: 1.70 cm MV A velocity: 112.00 cm/s MV E/A ratio:  0.77 Oswaldo Milian MD Electronically signed by Oswaldo Milian MD Signature Date/Time: 05/03/2020/11:28:16 AM    Final    CT Maxillofacial WO CM  Result Date: 05/02/2020 CLINICAL DATA:  Fall.  Hit head on plaster wall. EXAM: CT HEAD WITHOUT CONTRAST CT MAXILLOFACIAL WITHOUT CONTRAST CT CERVICAL SPINE WITHOUT CONTRAST TECHNIQUE: Multidetector CT imaging of the head, cervical spine, and maxillofacial structures were performed using the standard protocol without intravenous contrast. Multiplanar CT image reconstructions of the cervical spine and maxillofacial structures were also generated. COMPARISON:  CT head 01/21/2020 CT maxillofacial 08/04/2018. FINDINGS: CT HEAD FINDINGS Brain: There are acute bilateral subdural hematomas which extend along the tentorium, right greater than left. On the right this  measures up to 6 mm in thickness. On the left this measures approximately 3 mm in maximum thickness. No signs of acute brain infarct or mass. Ventricular volumes are within normal limits. Vascular: No hyperdense vessel or unexpected calcification. Skull: Normal. Negative for fracture or focal lesion. Other: Right posterior parietal scalp  laceration and hematoma is noted, image 62/3. CT MAXILLOFACIAL FINDINGS Osseous: Nondisplaced bilateral nasal bone fractures, image 48/3. No additional fractures identified. Orbits: Negative. No traumatic or inflammatory finding. Sinuses: Clear Soft tissues: Negative. CT CERVICAL SPINE FINDINGS Alignment: Alignment of the cervical spine appears normal. The facet joints appear well aligned. Skull base and vertebrae: No acute fracture. No primary bone lesion or focal pathologic process. Soft tissues and spinal canal: No prevertebral fluid or swelling. No visible canal hematoma. Disc levels:  Within normal limits. Upper chest: Negative. Other: None IMPRESSION: 1. Acute bilateral subdural hematomas which extend along the tentorium, right greater than left. 2. Right posterior parietal scalp laceration and hematoma. 3. Nondisplaced bilateral nasal bone fractures. 4. No evidence for cervical spine fracture or dislocation. 5. Critical Value/emergent results were called by telephone at the time of interpretation on 05/02/2020 at 7:11 pm to provider Dr. Melina Copa, Who verbally acknowledged these results. Electronically Signed   By: Kerby Moors M.D.   On: 05/02/2020 19:12    Scheduled Meds: . [MAR Hold] Chlorhexidine Gluconate Cloth  6 each Topical Daily  . [MAR Hold] insulin aspart  0-9 Units Subcutaneous TID WC  . [MAR Hold] pantoprazole (PROTONIX) IV  40 mg Intravenous Q12H   Continuous Infusions: . sodium chloride 75 mL/hr at 05/03/20 1703  . sodium chloride    . ceFAZolin       LOS: 1 day   Time spent: 37 minutes   Darliss Cheney, MD [facility]  05/04/2020, 2:42  PM   To contact the attending provider between 7A-7P or the covering provider during after hours 7P-7A, please log into the web site [email or web address].

## 2020-05-04 NOTE — Anesthesia Procedure Notes (Signed)
Procedure Name: Intubation Date/Time: 05/04/2020 1:26 PM Performed by: Barrington Ellison, CRNA Pre-anesthesia Checklist: Patient identified, Emergency Drugs available, Suction available and Patient being monitored Patient Re-evaluated:Patient Re-evaluated prior to induction Oxygen Delivery Method: Circle System Utilized Preoxygenation: Pre-oxygenation with 100% oxygen Induction Type: IV induction, Rapid sequence and Cricoid Pressure applied Laryngoscope Size: Mac and 3 Grade View: Grade I Tube type: Oral Tube size: 7.0 mm Number of attempts: 1 Airway Equipment and Method: Stylet and Oral airway Placement Confirmation: ETT inserted through vocal cords under direct vision,  positive ETCO2 and breath sounds checked- equal and bilateral Secured at: 21 cm Tube secured with: Tape Dental Injury: Teeth and Oropharynx as per pre-operative assessment

## 2020-05-05 ENCOUNTER — Inpatient Hospital Stay (HOSPITAL_COMMUNITY): Payer: Medicare Other

## 2020-05-05 ENCOUNTER — Encounter (HOSPITAL_COMMUNITY): Payer: Self-pay | Admitting: Orthopaedic Surgery

## 2020-05-05 LAB — CBC WITH DIFFERENTIAL/PLATELET
Abs Immature Granulocytes: 0.21 10*3/uL — ABNORMAL HIGH (ref 0.00–0.07)
Abs Immature Granulocytes: 0.2 10*3/uL — ABNORMAL HIGH (ref 0.00–0.07)
Basophils Absolute: 0.1 10*3/uL (ref 0.0–0.1)
Basophils Absolute: 0.1 10*3/uL (ref 0.0–0.1)
Basophils Relative: 0 %
Basophils Relative: 1 %
Eosinophils Absolute: 0.1 10*3/uL (ref 0.0–0.5)
Eosinophils Absolute: 0.2 10*3/uL (ref 0.0–0.5)
Eosinophils Relative: 0 %
Eosinophils Relative: 2 %
HCT: 23.3 % — ABNORMAL LOW (ref 36.0–46.0)
HCT: 22.9 % — ABNORMAL LOW (ref 36.0–46.0)
Hemoglobin: 7.2 g/dL — ABNORMAL LOW (ref 12.0–15.0)
Hemoglobin: 7 g/dL — ABNORMAL LOW (ref 12.0–15.0)
Immature Granulocytes: 2 %
Immature Granulocytes: 2 %
Lymphocytes Relative: 13 %
Lymphocytes Relative: 19 %
Lymphs Abs: 1.5 10*3/uL (ref 0.7–4.0)
Lymphs Abs: 2.2 10*3/uL (ref 0.7–4.0)
MCH: 33 pg (ref 26.0–34.0)
MCH: 33 pg (ref 26.0–34.0)
MCHC: 30.9 g/dL (ref 30.0–36.0)
MCHC: 30.6 g/dL (ref 30.0–36.0)
MCV: 106.9 fL — ABNORMAL HIGH (ref 80.0–100.0)
MCV: 108 fL — ABNORMAL HIGH (ref 80.0–100.0)
Monocytes Absolute: 1.3 10*3/uL — ABNORMAL HIGH (ref 0.1–1.0)
Monocytes Absolute: 1.3 10*3/uL — ABNORMAL HIGH (ref 0.1–1.0)
Monocytes Relative: 11 %
Monocytes Relative: 12 %
Neutro Abs: 8.4 10*3/uL — ABNORMAL HIGH (ref 1.7–7.7)
Neutro Abs: 7.4 10*3/uL (ref 1.7–7.7)
Neutrophils Relative %: 74 %
Neutrophils Relative %: 64 %
Platelets: 170 10*3/uL (ref 150–400)
Platelets: 201 10*3/uL (ref 150–400)
RBC: 2.18 MIL/uL — ABNORMAL LOW (ref 3.87–5.11)
RBC: 2.12 MIL/uL — ABNORMAL LOW (ref 3.87–5.11)
RDW: 15.7 % — ABNORMAL HIGH (ref 11.5–15.5)
RDW: 15.7 % — ABNORMAL HIGH (ref 11.5–15.5)
WBC: 11.5 10*3/uL — ABNORMAL HIGH (ref 4.0–10.5)
WBC: 11.5 10*3/uL — ABNORMAL HIGH (ref 4.0–10.5)
nRBC: 0.8 % — ABNORMAL HIGH (ref 0.0–0.2)
nRBC: 0.7 % — ABNORMAL HIGH (ref 0.0–0.2)

## 2020-05-05 LAB — GLUCOSE, CAPILLARY
Glucose-Capillary: 118 mg/dL — ABNORMAL HIGH (ref 70–99)
Glucose-Capillary: 147 mg/dL — ABNORMAL HIGH (ref 70–99)
Glucose-Capillary: 148 mg/dL — ABNORMAL HIGH (ref 70–99)
Glucose-Capillary: 119 mg/dL — ABNORMAL HIGH (ref 70–99)

## 2020-05-05 LAB — BASIC METABOLIC PANEL
Anion gap: 13 (ref 5–15)
BUN: 28 mg/dL — ABNORMAL HIGH (ref 8–23)
CO2: 19 mmol/L — ABNORMAL LOW (ref 22–32)
Calcium: 7.7 mg/dL — ABNORMAL LOW (ref 8.9–10.3)
Chloride: 109 mmol/L (ref 98–111)
Creatinine, Ser: 2.39 mg/dL — ABNORMAL HIGH (ref 0.44–1.00)
GFR calc Af Amer: 24 mL/min — ABNORMAL LOW (ref >60)
GFR calc non Af Amer: 21 mL/min — ABNORMAL LOW (ref >60)
Glucose, Bld: 130 mg/dL — ABNORMAL HIGH (ref 70–99)
Potassium: 4.7 mmol/L (ref 3.5–5.1)
Sodium: 141 mmol/L (ref 135–145)

## 2020-05-05 LAB — MAGNESIUM: Magnesium: 2 mg/dL (ref 1.7–2.4)

## 2020-05-05 MED ORDER — VENLAFAXINE HCL ER 75 MG PO CP24
75.0000 mg | ORAL_CAPSULE | Freq: Every day | ORAL | Status: DC
  Administered 2020-05-05 – 2020-05-12 (×6): 75 mg via ORAL
  Filled 2020-05-05 (×6): qty 1

## 2020-05-05 MED ORDER — NAPHAZOLINE-GLYCERIN 0.012-0.2 % OP SOLN
1.0000 [drp] | Freq: Four times a day (QID) | OPHTHALMIC | Status: DC | PRN
  Filled 2020-05-05: qty 15

## 2020-05-05 MED ORDER — TORSEMIDE 20 MG PO TABS
20.0000 mg | ORAL_TABLET | ORAL | Status: DC
  Administered 2020-05-11: 20 mg via ORAL
  Filled 2020-05-05: qty 1

## 2020-05-05 MED ORDER — QUETIAPINE FUMARATE 100 MG PO TABS
300.0000 mg | ORAL_TABLET | Freq: Every day | ORAL | Status: DC
  Administered 2020-05-05 – 2020-05-11 (×6): 300 mg via ORAL
  Filled 2020-05-05: qty 6
  Filled 2020-05-05 (×2): qty 3
  Filled 2020-05-05: qty 6
  Filled 2020-05-05 (×3): qty 3

## 2020-05-05 MED ORDER — SEVELAMER CARBONATE 800 MG PO TABS
1600.0000 mg | ORAL_TABLET | Freq: Three times a day (TID) | ORAL | Status: DC
  Administered 2020-05-05 – 2020-05-12 (×13): 1600 mg via ORAL
  Filled 2020-05-05 (×13): qty 2

## 2020-05-05 MED ORDER — OXYBUTYNIN CHLORIDE ER 15 MG PO TB24
15.0000 mg | ORAL_TABLET | Freq: Every day | ORAL | Status: DC | PRN
  Filled 2020-05-05: qty 1

## 2020-05-05 MED ORDER — GABAPENTIN 100 MG PO CAPS
100.0000 mg | ORAL_CAPSULE | Freq: Three times a day (TID) | ORAL | Status: DC
  Administered 2020-05-05 – 2020-05-12 (×18): 100 mg via ORAL
  Filled 2020-05-05 (×19): qty 1

## 2020-05-05 MED ORDER — ATORVASTATIN CALCIUM 10 MG PO TABS
20.0000 mg | ORAL_TABLET | Freq: Every day | ORAL | Status: DC
  Administered 2020-05-05 – 2020-05-12 (×7): 20 mg via ORAL
  Filled 2020-05-05 (×7): qty 2

## 2020-05-05 MED ORDER — CLONAZEPAM 1 MG PO TABS
1.0000 mg | ORAL_TABLET | Freq: Two times a day (BID) | ORAL | Status: DC | PRN
  Administered 2020-05-05 – 2020-05-10 (×5): 1 mg via ORAL
  Filled 2020-05-05 (×5): qty 1

## 2020-05-05 NOTE — Progress Notes (Signed)
PROGRESS NOTE    Tina Ramos  SHF:[id number] DOB: 1956/04/18 DOA: 05/02/2020 PCP: [facility]   Brief Narrative:  Tina Ramos is a 64 y.o. female with medical history significant of liver cirrhosis, insulin-dependent type 2 diabetes, hyperlipidemia, gastroparesis, CKD stage IV presented to the ED after sustaining head injury from a fall.  Patient was recently evaluated in the ED on 8/9 after a fall and was diagnosed with a mildly displaced spiral fracture of the proximal diaphysis of the left humerus. she has not been able to see orthopedics yet.   Complaint of extremely swollen and painful left upper extremity associated with numbness and tingling in her fingers in the same extremity. she has stage IV kidney disease and has a dialysis fistula in this arm. she has had multiple falls in the past 2 weeks.  On average she was following 4-5 times a day.  Each time she describes it as just falling on the ground all of a sudden. Denied preceding lightheadedness/dizziness, chest pain, shortness of breath, or palpitations she hit her head and could feel warm blood going down her face.  Denied history of seizures.  Patient states her 46 year old father takes care of her and is concerned that she is falling so frequently.  She takes a baby aspirin daily.  Upon arrival to ED, vital signs stable in the ED.  Labs showing no leukocytosis.  Hemoglobin 7.7, MCV 103.8.  Hemoglobin was 10.4 on labs done in March 2021.  Creatinine 3.2, stable compared to labs done 5 months ago.  INR 1.1.  SARS-CoV-2 PCR test negative.  UA not suggestive of infection.  MRSA PCR screen negative.  Head CT showing acute bilateral subdural hematomas which extend along the tentorium, right greater than left.  Right posterior parietal scalp laceration and hematoma.    CT maxillofacial showing nondisplaced bilateral nasal bone fractures.  CT C-spine negative for acute abnormality.  X-ray of right shoulder  negative for acute fracture or dislocation.  Chest x-ray showing no acute intrathoracic process.  Showing known displaced left humeral diaphyseal fracture, partially imaged on the study.  Increased displacement since prior exam.  ED provider discussed the case with Dr. Trenton Gammon from neurosurgery who felt that the subdural hematomas were small.  He recommended neurochecks and potential discharge from the ED.  Admission requested as it is not safe for the patient to go home given frequency of falls.  Will need PT and OT evaluation.  Scalp laceration was repaired in the ED.  Foley catheter was placed as patient does self-catheterization at home.  Patient received fentanyl, IV fluid, and Tdap booster  Assessment & Plan:   Principal Problem:   Subdural hematoma (HCC) Active Problems:   Humerus fracture   Scalp laceration   Nasal bone fractures   Falls frequently   Acute bilateral subdural hematomas secondary to a fall: Patient takes aspirin 81 mg daily at home.  Head CT showing acute bilateral subdural hematomas which extend along the tentorium, right greater than left.  ED provider discussed the case with neurosurgery and and it was felt that the hematomas were small and neurosurgery in fact recommended discharging patient home however due to frequent falls, she was admitted under hospital service.  There is no consultation note from neurosurgery.  Per H&P from hospitalist, plan was to repeat CT head however there was no CT head ordered.  Patient was getting ready to have surgery yesterday so I did not order that.  Now she is doing  fine.  I will order repeat CT head without contrast today.  Holding aspirin for now.  We will touch base with neurosurgery after CT scans results today.  Displaced spiral fracture of the proximal diaphysis of the left humerus: Diagnosed after a fall on 8/9.  Imaging done this hospitalization showing increased displacement. Now status post left humeral shaft  intramedullary nail fixation 05/04/2020.  Scalp laceration -Repaired in the ED. Tdap booster was given.  Nondisplaced bilateral nasal bone fractures: Seen on CT. ENT follow-up as outpatient.  Frequent falls: Etiology unknown.  Likely due to deconditioning or vasovagal.  No event on telemetry noted so far.  Echo with normal ejection fraction and no wall motion abnormality and no valvular heart disease.  EEG pending.  We will continue to monitor on telemetry.  Will be seen by PT OT.  Acute on chronic anemia/hematemesis: Hemoglobin 7.7, MCV 103.8.  Hemoglobin was 10.4 on labs done in March 2021.  Patient is not endorsing any symptoms of GI bleed but she tells me that she bruises easily because it " a woman's skin".  Does have significant ecchymosis to her chest wall and left upper extremity from her recent fall which could possibly be contributing to the dropping hemoglobin to some degree.  Reportedly, she had one episode of vomiting which was black on late morning of 05/04/2020.  Patient denies having any history of GI bleed in the past but she has history of GERD for which she takes Protonix.  Hemoglobin was repeated which was stable.  Hemoglobin today 7.2.  No further episodes like this.  Unfortunately, sample was not taken for occult blood.  I have ordered fecal occult blood test which is still pending.  I started her on Protonix twice daily which I will continue.  Monitor for signs and symptoms and hemoglobin trend.  Will consult GI if signs or symptoms of GI bleed.  Hyperlipidemia: Will resume atorvastatin tomorrow.  Essential hypertension: Blood pressure elevated. -Continue hydralazine PRN SBP >170.  We will add labetalol as needed as well.  She does not seem to be on any scheduled home medications prior to hospitalization.  If remains elevated persistently, will add amlodipine.  Insulin-dependent type 2 diabetes: Hemoglobin A1c 6.0.  Controlled blood sugar now.  Continue SSI.  Mood  disorder: -Resume home medications  CKD stage IV: Renal function at baseline. -Resume home Renvela.  DVT prophylaxis: SCDs Start: 05/04/20 1640 SCDs Start: 05/03/20 0325   Code Status: Full Code  Family Communication: None present at bedside.  Plan of care discussed with patient in length and she verbalized understanding and agreed with it.  Status is: Inpatient  Remains inpatient appropriate because:Inpatient level of care appropriate due to severity of illness   Dispo: The patient is from: Home              Anticipated d/c is to: SNF              Anticipated d/c date is:2- 3 days              Patient currently is not medically stable to d/c.        Estimated body mass index is 25.06 kg/m as calculated from the following:   Height as of 04/25/20: 5\' 2"  (1.575 m).   Weight as of 04/25/20: 62.1 kg.      Nutritional status:               Consultants:   Orthopedics  Procedures:  left humeral shaft  intramedullary nail fixation 05/04/2020.  Antimicrobials:  Anti-infectives (From admission, onward)   Start     Dose/Rate Route Frequency Ordered Stop   05/04/20 1700  ceFAZolin (ANCEF) IVPB 1 g/50 mL premix        1 g 100 mL/hr over 30 Minutes Intravenous Every 6 hours 05/04/20 1639 05/05/20 0514   05/04/20 1349  vancomycin (VANCOCIN) powder  Status:  Discontinued          As needed 05/04/20 1349 05/04/20 1520   05/04/20 1314  ceFAZolin (ANCEF) 2-4 GM/100ML-% IVPB  Status:  Discontinued       Note to Pharmacy: Tina Ramos   : cabinet override      05/04/20 1314 05/04/20 1532   05/04/20 1000  ceFAZolin (ANCEF) IVPB 2g/100 mL premix        2 g 200 mL/hr over 30 Minutes Intravenous  Once 05/03/20 1316 05/04/20 0949         Subjective: Seen and examined.  Feeling better.  Left arm pain.  No new complaint.  Alert and oriented.  Pleasant as she was yesterday.  Objective: Vitals:   05/04/20 2000 05/05/20 0031 05/05/20 0450 05/05/20 0800  BP:  (!) 166/58  (!) 155/46 (!) 145/84  Pulse: 98 (!) 114 (!) 101 (!) 111  Resp: 20 (!) 23 17 20   Temp:  100 F (37.8 C) 100 F (37.8 C) 99.9 F (37.7 C)  TempSrc:  Oral Oral Oral  SpO2:  99% 100% 93%    Intake/Output Summary (Last 24 hours) at 05/05/2020 1024 Last data filed at 05/05/2020 0400 Gross per 24 hour  Intake 1525 ml  Output 400 ml  Net 1125 ml   There were no vitals filed for this visit.  Examination:  General exam: Appears calm and comfortable  Respiratory system: Clear to auscultation. Respiratory effort normal. Cardiovascular system: S1 & S2 heard, RRR. No JVD, murmurs, rubs, gallops or clicks. No pedal edema. Gastrointestinal system: Abdomen is nondistended, soft and nontender. No organomegaly or masses felt. Normal bowel sounds heard. Central nervous system: Alert and oriented. No focal neurological deficits. Skin: Large bruise on the left upper extremity and left anterior chest Psychiatry: Judgement and insight appear normal. Mood & affect appropriate.   Data Reviewed: I have personally reviewed following labs and imaging studies  CBC: Recent Labs  Lab 05/02/20 1908 05/02/20 1908 05/03/20 0625 05/03/20 0625 05/03/20 1635 05/04/20 0810 05/04/20 1200 05/04/20 2001 05/05/20 0836  WBC 7.9   < > 5.8  --   --  10.6* 10.4 8.2 11.5*  NEUTROABS 5.7  --   --   --   --   --  7.1 6.9 8.4*  HGB 7.7*   < > 7.8*   < > 9.6* 8.9* 8.7* 7.6* 7.2*  HCT 24.4*   < > 25.4*   < > 31.0* 28.1* 28.1* 25.4* 23.3*  MCV 103.8*   < > 103.7*  --   --  104.9* 103.3* 107.6* 106.9*  PLT 154   < > 152  --   --  191 190 190 170   < > = values in this interval not displayed.   Basic Metabolic Panel: Recent Labs  Lab 05/02/20 1908 05/04/20 0810 05/05/20 0836  NA 137 139 141  K 4.3 5.1 4.7  CL 103 104 109  CO2 22 21* 19*  GLUCOSE 127* 122* 130*  BUN 48* 34* 28*  CREATININE 3.28* 2.66* 2.39*  CALCIUM 8.2* 8.2* 7.7*  MG  --  2.1 2.0  PHOS  --  2.4*  --    GFR: Estimated Creatinine  Clearance: 20.3 mL/min (A) (by C-G formula based on SCr of 2.39 mg/dL (H)). Liver Function Tests: Recent Labs  Lab 05/02/20 1908  AST 29  ALT 17  ALKPHOS 60  BILITOT 0.7  PROT 6.7  ALBUMIN 3.6   No results for input(s): LIPASE, AMYLASE in the last 168 hours. No results for input(s): AMMONIA in the last 168 hours. Coagulation Profile: Recent Labs  Lab 05/02/20 1928  INR 1.1   Cardiac Enzymes: No results for input(s): CKTOTAL, CKMB, CKMBINDEX, TROPONINI in the last 168 hours. BNP (last 3 results) No results for input(s): PROBNP in the last 8760 hours. HbA1C: Recent Labs    05/03/20 0625  HGBA1C 6.0*   CBG: Recent Labs  Lab 05/04/20 1133 05/04/20 1529 05/04/20 1717 05/04/20 2210 05/05/20 0919  GLUCAP 136* 137* 139* 128* 118*   Lipid Profile: No results for input(s): CHOL, HDL, LDLCALC, TRIG, CHOLHDL, LDLDIRECT in the last 72 hours. Thyroid Function Tests: No results for input(s): TSH, T4TOTAL, FREET4, T3FREE, THYROIDAB in the last 72 hours. Anemia Panel: Recent Labs    05/03/20 0625  VITAMINB12 1,856*  FOLATE 11.9  FERRITIN 151  TIBC 325  IRON 55  RETICCTPCT 2.8   Sepsis Labs: No results for input(s): PROCALCITON, LATICACIDVEN in the last 168 hours.  Recent Results (from the past 240 hour(s))  SARS Coronavirus 2 by RT PCR (hospital order, performed in [facility] hospital lab) Nasopharyngeal Nasopharyngeal Swab     Status: None   Collection Time: 05/02/20  7:28 PM   Specimen: Nasopharyngeal Swab  Result Value Ref Range Status   SARS Coronavirus 2 NEGATIVE NEGATIVE Final    Comment: (NOTE) SARS-CoV-2 target nucleic acids are NOT DETECTED.  The SARS-CoV-2 RNA is generally detectable in upper and lower respiratory specimens during the acute phase of infection. The lowest concentration of SARS-CoV-2 viral copies this assay can detect is 250 copies / mL. A negative result does not preclude SARS-CoV-2 infection and should not be used as the sole basis for  treatment or other patient management decisions.  A negative result may occur with improper specimen collection / handling, submission of specimen other than nasopharyngeal swab, presence of viral mutation(s) within the areas targeted by this assay, and inadequate number of viral copies (<250 copies / mL). A negative result must be combined with clinical observations, patient history, and epidemiological information.  Fact Sheet for Patients:   StrictlyIdeas.no  Fact Sheet for Healthcare Providers: BankingDealers.co.za  This test is not yet approved or  cleared by the [place] FDA and has been authorized for detection and/or diagnosis of SARS-CoV-2 by FDA under an Emergency Use Authorization (EUA).  This EUA will remain in effect (meaning this test can be used) for the duration of the COVID-19 declaration under Section 564(b)(1) of the Act, 21 U.S.C. section 360bbb-3(b)(1), unless the authorization is terminated or revoked sooner.  Performed at [facility], [address] [place], [place] [address]   MRSA PCR Screening     Status: None   Collection Time: 05/02/20 11:40 PM   Specimen: Nasal Mucosa; Nasopharyngeal  Result Value Ref Range Status   MRSA by PCR NEGATIVE NEGATIVE Final    Comment:        The GeneXpert MRSA Assay (FDA approved for NASAL specimens only), is one component of a comprehensive MRSA colonization surveillance program. It is not intended to diagnose MRSA infection nor to guide or  monitor treatment for MRSA infections. Performed at [facility], [place] [place], [place] [address]       Radiology Studies: CT SHOULDER LEFT WO CONTRAST  Result Date: 05/03/2020 CLINICAL DATA:  The patient suffered a left humerus fracture in a fall 04/25/2020. Subsequent encounter. EXAM: CT OF THE UPPER LEFT EXTREMITY WITHOUT CONTRAST TECHNIQUE: Multidetector CT imaging of the upper left  extremity was performed according to the standard protocol. COMPARISON:  Plain films of the left humerus 04/25/2020. FINDINGS: Bones/Joint/Cartilage As seen on the comparison examination, the patient has a spiral fracture of the proximal humerus. The most superior fracture line is 5 cm below the top of the humeral head and nondisplaced. Approximately 7.5 cm below the top of the humeral head, the fracture is displaced with fragment override of approximately 1.6 cm and almost 1 shaft width medial displacement of the distal fragment. There is also approximately 30 degrees dorsal angulation of the distal fragment. The fracture exits the diaphysis of the humerus approximately 17 cm below the top of the humeral head. The humeral head is located and the acromioclavicular joint is intact. Mild acromioclavicular osteoarthritis is present. Multilevel cervical facet degenerative disease is partially imaged. Ligaments Suboptimally assessed by CT. Muscles and Tendons There is some hematoma formation about the patient's fracture. Soft tissues Soft tissue contusion about the fracture noted. IMPRESSION: Acute spiral fracture of the diaphysis of the left humerus as described. Electronically Signed   By: Inge Rise M.D.   On: 05/03/2020 10:46   DG Humerus Left  Result Date: 05/04/2020 CLINICAL DATA:  Fixation of left humerus fracture. EXAM: LEFT HUMERUS - 2+ VIEW; DG C-ARM 1-60 MIN COMPARISON:  CT scan 05/03/2020 FINDINGS: Intraoperative spot films and postoperative radiographs demonstrate an intramedullary rod in the humerus with 2 proximal and 1 distal interlocking screws. This is transfixing the spiral type proximal humeral shaft fracture. Mild displacement of the fracture is demonstrated. IMPRESSION: Internal fixation of the proximal humeral shaft fracture. Electronically Signed   By: Marijo Sanes M.D.   On: 05/04/2020 16:49   DG Humerus Left  Result Date: 05/04/2020 CLINICAL DATA:  Fixation of left humerus  fracture. EXAM: LEFT HUMERUS - 2+ VIEW; DG C-ARM 1-60 MIN COMPARISON:  CT scan 05/03/2020 FINDINGS: Intraoperative spot films and postoperative radiographs demonstrate an intramedullary rod in the humerus with 2 proximal and 1 distal interlocking screws. This is transfixing the spiral type proximal humeral shaft fracture. Mild displacement of the fracture is demonstrated. IMPRESSION: Internal fixation of the proximal humeral shaft fracture. Electronically Signed   By: Marijo Sanes M.D.   On: 05/04/2020 16:49   EEG adult  Result Date: 05/03/2020 Lora Havens, MD     05/03/2020 12:30 PM Patient Name: Tina Ramos MRN: [id number] Epilepsy Attending: Lora Havens Referring Physician/Provider: Dr. Shela Leff Date: 8/70/2021 Duration: 26.19 mins Patient history: 27 old female with multiple episodes of fall/syncope.  EEG evaluate for seizures. Level of alertness: Awake AEDs during EEG study: None Technical aspects: This EEG study was done with scalp electrodes positioned according to the 10-20 International system of electrode placement. Electrical activity was acquired at a sampling rate of 500Hz  and reviewed with a high frequency filter of 70Hz  and a low frequency filter of 1Hz . EEG data were recorded continuously and digitally stored. Description: The posterior dominant rhythm consists of 7.5 Hz activity of moderate voltage (25-35 uV) seen predominantly in posterior head regions, symmetric and reactive to eye opening and eye closing. Hyperventilation and  photic stimulation were not performed.   IMPRESSION: This study is within normal limits. No seizures or epileptiform discharges were seen throughout the recording. Lora Havens   DG C-Arm 1-60 Min  Result Date: 05/04/2020 CLINICAL DATA:  Fixation of left humerus fracture. EXAM: LEFT HUMERUS - 2+ VIEW; DG C-ARM 1-60 MIN COMPARISON:  CT scan 05/03/2020 FINDINGS: Intraoperative spot films and postoperative radiographs demonstrate an  intramedullary rod in the humerus with 2 proximal and 1 distal interlocking screws. This is transfixing the spiral type proximal humeral shaft fracture. Mild displacement of the fracture is demonstrated. IMPRESSION: Internal fixation of the proximal humeral shaft fracture. Electronically Signed   By: Marijo Sanes M.D.   On: 05/04/2020 16:49    Scheduled Meds: . Chlorhexidine Gluconate Cloth  6 each Topical Daily  . docusate sodium  100 mg Oral BID  . insulin aspart  0-9 Units Subcutaneous TID WC  . pantoprazole (PROTONIX) IV  40 mg Intravenous Q12H   Continuous Infusions: . sodium chloride 75 mL/hr at 05/04/20 1716     LOS: 2 days   Time spent: 30 minutes   Darliss Cheney, MD [facility]  05/05/2020, 10:24 AM   To contact the attending provider between 7A-7P or the covering provider during after hours 7P-7A, please log into the web site [email or web address].

## 2020-05-05 NOTE — Progress Notes (Signed)
Occupational Therapy Treatment Patient Details Name: Tina Ramos MRN: [id number] DOB: [birth year] Today's Date: 05/05/2020    History of present illness This is a 64 year old female with a history of cirrhosis, diabetes, hyperlipidemia who presents with left arm pain.  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 D/C'd home. Back to ED on 8/16 suffering a fall while she was in her kitchen.  She states she was at the refrigerator and started to fall hitting the drywall with her head and right shoulder then sliding down onto her butt.Reports 4-5 falls per day since 8/9. FI:EPPIR acute bilateral subdural hematomas which extend along the tentorium, right greater than left.  She has nondisplaced bilateral nasal bone fractures which will likely not require any ENT intervention.Imaging of left UE fracture showing increased displacement.  There is concern for possible compartment syndrome since the left upper extremity is extremely swollen and patient with complaints of severe pain and paresthesias.  Pt underwent IM nail to L humerous on 8/18.   OT comments  Pt had IM nail of L humerous on 8/18 and having some nausea and pain this am.  Pt instructed on how to donn and doff sling and AROM initiated to L fingers and wrist and mildly to elbow. Pain limiting elbow ROM. Feel as pain gets under better control, pt will tolerate more ROM with this lower arm while keeping shoulder immobilized for next two weeks per MD orders.     Follow Up Recommendations  SNF    Equipment Recommendations  None recommended by OT    Recommendations for Other Services      Precautions / Restrictions Precautions Precautions: Fall;Shoulder Type of Shoulder Precautions: AROM to L fingers, wrist and elbow only.  No active ROM or movement to the L shoulder for next two weeks.  Shoulder Interventions: Shoulder  sling/immobilizer Precaution Booklet Issued: No Required Braces or Orthoses: Sling Restrictions Weight Bearing Restrictions: Yes LUE Weight Bearing: Non weight bearing       Mobility Bed Mobility Overal bed mobility: Needs Assistance Bed Mobility: Supine to Sit     Supine to sit: Min assist     General bed mobility comments: min HHA for elevation of trunk into sitting  Transfers Overall transfer level: Needs assistance Equipment used: 1 person hand held assist Transfers: Sit to/from [facility] Sit to Stand: Min assist Stand pivot transfers: Min assist       General transfer comment: min A to steady    Balance Overall balance assessment: Needs assistance Sitting-balance support: Feet supported Sitting balance-Leahy Scale: Good     Standing balance support: Single extremity supported Standing balance-Leahy Scale: Poor Standing balance comment: unsteady without UE support                           ADL either performed or assessed with clinical judgement   ADL Overall ADL's : Needs assistance/impaired Eating/Feeding: Set up;Sitting   Grooming: Minimal assistance;Sitting   Upper Body Bathing: Moderate assistance;Sitting   Lower Body Bathing: Moderate assistance   Upper Body Dressing : Maximal assistance;Sitting   Lower Body Dressing: Moderate assistance   Toilet Transfer: Minimal assistance;Stand-pivot   Toileting- Clothing Manipulation and Hygiene: Moderate assistance Toileting - Clothing Manipulation Details (indicate cue type and reason): min A sit<>stand     Functional mobility during ADLs: Minimal assistance General ADL Comments: Pt very  limited today due to pain and nausea.  Pt requires a great amount of assist due to pain and arm in sling.     Vision   Vision Assessment?: No apparent visual deficits   Perception     Praxis      Cognition Arousal/Alertness: Awake/alert Behavior During Therapy: WFL for tasks  assessed/performed Overall Cognitive Status: Within Functional Limits for tasks assessed                                 General Comments: mildly impulsive        Exercises Exercises: Other exercises Other Exercises Other Exercises: gentle PROM and some AROM to L fingers, wrist and elbow completed.  Pt did not tolerate much ROM to elbow.   Shoulder Instructions       General Comments Pt very nauseous today and in a great amount of pain. Pt very clammy and sweaty not feeling well.    Pertinent Vitals/ Pain       Pain Assessment: Faces Faces Pain Scale: Hurts whole lot Pain Location: LUE Pain Descriptors / Indicators: Aching;Sore Pain Intervention(s): Limited activity within patient's tolerance;Monitored during session;Premedicated before session;Repositioned  Home Living                                          Prior Functioning/Environment              Frequency  Min 2X/week        Progress Toward Goals  OT Goals(current goals can now be found in the care plan section)  Progress towards OT goals: Progressing toward goals  Acute Rehab OT Goals Patient Stated Goal: for pain control OT Goal Formulation: With patient Time For Goal Achievement: 05/17/20 Potential to Achieve Goals: Good ADL Goals Pt Will Perform Grooming: with modified independence;standing Pt Will Perform Upper Body Bathing: with min assist;standing Pt Will Perform Lower Body Bathing: with modified independence;sit to/from stand Pt Will Perform Upper Body Dressing: with modified independence;sitting;standing Pt Will Perform Lower Body Dressing: with modified independence;sit to/from stand Pt Will Transfer to Toilet: with modified independence;ambulating;regular height toilet;grab bars Pt Will Perform Toileting - Clothing Manipulation and hygiene: with modified independence;sit to/from stand Additional ADL Goal #1: Pt will be Mod I in and OOB for basic ADLs  Plan  Discharge plan remains appropriate    Co-evaluation                 AM-PAC OT "6 Clicks" Daily Activity     Outcome Measure   Help from another person eating meals?: A Little Help from another person taking care of personal grooming?: A Little Help from another person toileting, which includes using toliet, bedpan, or urinal?: A Lot Help from another person bathing (including washing, rinsing, drying)?: A Lot Help from another person to put on and taking off regular upper body clothing?: A Lot Help from another person to put on and taking off regular lower body clothing?: A Lot 6 Click Score: 14    End of Session Equipment Utilized During Treatment: Other (comment) (sling)  OT Visit Diagnosis: Unsteadiness on feet (R26.81);Other abnormalities of gait and mobility (R26.89);Repeated falls (R29.6);History of falling (Z91.81);Pain Pain - Right/Left: Left Pain - part of body: Shoulder;Arm   Activity Tolerance Patient limited by pain   Patient Left Other (comment) (transport chair to  CT scan)   Nurse Communication Mobility status        Time: 3244-0102 OT Time Calculation (min): 22 min  Charges: OT Treatments $Self Care/Home Management : 8-22 mins    Glenford Peers 05/05/2020, 11:34 AM

## 2020-05-05 NOTE — Progress Notes (Signed)
   ORTHOPAEDIC PROGRESS NOTE  s/p Procedure(s): LEFT INTRAMEDULLARY (IM) NAIL HUMERAL on 8/18/20021 with Dr. Griffin Basil  SUBJECTIVE: Patient sitting in hospital bed. Says she feels hot and can't cool down. Reports pain in her operative arm. She notes it no longer feels like it will "burst." Denies any numbness and tingling. Denies any lower extremity pain or swelling. No chest pain or shortness of breath.   OBJECTIVE: PE: General: alert & oriented, no acute distress Cardiac: tachycardic. No lower extremity edema Left upper extremity: Dressing CDI - minimal strike-through noted. Swelling and bruising of left arm. Compartments compressible. She endorses axillary nerve sensation. + Motor in  AIN, PIN, Ulnar distributions.  Sensation intact in medial, radial, and ulnar distributions. Well perfused digits.   Vitals:   05/05/20 0031 05/05/20 0450  BP: (!) 166/58 (!) 155/46  Pulse: (!) 114 (!) 101  Resp: (!) 23 17  Temp: 100 F (37.8 C) 100 F (37.8 C)  SpO2: 99% 100%    ASSESSMENT: Tina Ramos is a 64 y.o. female status post IMN left humerus. POD#1  PLAN: Weightbearing: NWB LUE   - Pendulums start week 2   - Active assisted ROM starting week 3 Insicional and dressing care: Reinforce dressings as needed Orthopedic device(s): Sling for 6 weeks Showering: Post-op day #2 with assistance VTE prophylaxis: SCDs. Ambulation. Due to patient's history of bilateral subdural hematomas and CKD stage IV we will defer to medicine for further DVT prophylaxis. No orthopedic contraindications.  Pain control: PRN pain medications. Preferring oral medications Follow - up plan: 1 week in office for incision check and xray Contact information:  Weekdays 8-5 Noemi Chapel PA-C [phone], After hours and holidays please check [email or web address] for group call information for [facility]    Noemi Chapel, PA-C 05/05/2020

## 2020-05-05 NOTE — Progress Notes (Signed)
PT Cancellation Note  Patient Details Name: Tina Ramos MRN: [id number] DOB: 02-21-1956   Cancelled Treatment:    Reason Eval/Treat Not Completed: Pain limiting ability to participate;Fatigue/lethargy limiting ability to participate.  Pt did not want to get up with PT this PM after going to CT earlier and working with OT she reports, "I am just not up for it today".  She is agreeable for PT to check back tomorrow. I helped her reposition higher in the bed and put ice on her left shoulder.   Thanks,  Verdene Lennert, PT, DPT  Acute Rehabilitation [phone] pager #[phone] office       Wells Guiles B Wesleigh Markovic 05/05/2020, 3:58 PM

## 2020-05-06 LAB — GLUCOSE, CAPILLARY
Glucose-Capillary: 128 mg/dL — ABNORMAL HIGH (ref 70–99)
Glucose-Capillary: 146 mg/dL — ABNORMAL HIGH (ref 70–99)
Glucose-Capillary: 128 mg/dL — ABNORMAL HIGH (ref 70–99)
Glucose-Capillary: 170 mg/dL — ABNORMAL HIGH (ref 70–99)

## 2020-05-06 LAB — CBC WITH DIFFERENTIAL/PLATELET
Abs Immature Granulocytes: 0.08 10*3/uL — ABNORMAL HIGH (ref 0.00–0.07)
Abs Immature Granulocytes: 0.14 10*3/uL — ABNORMAL HIGH (ref 0.00–0.07)
Basophils Absolute: 0.1 10*3/uL (ref 0.0–0.1)
Basophils Absolute: 0.1 10*3/uL (ref 0.0–0.1)
Basophils Relative: 1 %
Basophils Relative: 1 %
Eosinophils Absolute: 0.3 10*3/uL (ref 0.0–0.5)
Eosinophils Absolute: 0.4 10*3/uL (ref 0.0–0.5)
Eosinophils Relative: 3 %
Eosinophils Relative: 4 %
HCT: 21.4 % — ABNORMAL LOW (ref 36.0–46.0)
HCT: 27.3 % — ABNORMAL LOW (ref 36.0–46.0)
Hemoglobin: 6.4 g/dL — CL (ref 12.0–15.0)
Hemoglobin: 9 g/dL — ABNORMAL LOW (ref 12.0–15.0)
Immature Granulocytes: 1 %
Immature Granulocytes: 1 %
Lymphocytes Relative: 19 %
Lymphocytes Relative: 23 %
Lymphs Abs: 1.5 10*3/uL (ref 0.7–4.0)
Lymphs Abs: 2.3 10*3/uL (ref 0.7–4.0)
MCH: 32.7 pg (ref 26.0–34.0)
MCH: 33.6 pg (ref 26.0–34.0)
MCHC: 29.9 g/dL — ABNORMAL LOW (ref 30.0–36.0)
MCHC: 33 g/dL (ref 30.0–36.0)
MCV: 109.2 fL — ABNORMAL HIGH (ref 80.0–100.0)
MCV: 101.9 fL — ABNORMAL HIGH (ref 80.0–100.0)
Monocytes Absolute: 0.9 10*3/uL (ref 0.1–1.0)
Monocytes Absolute: 1.3 10*3/uL — ABNORMAL HIGH (ref 0.1–1.0)
Monocytes Relative: 11 %
Monocytes Relative: 13 %
Neutro Abs: 5.2 10*3/uL (ref 1.7–7.7)
Neutro Abs: 5.7 10*3/uL (ref 1.7–7.7)
Neutrophils Relative %: 65 %
Neutrophils Relative %: 58 %
Platelets: 154 10*3/uL (ref 150–400)
Platelets: 175 10*3/uL (ref 150–400)
RBC: 1.96 MIL/uL — ABNORMAL LOW (ref 3.87–5.11)
RBC: 2.68 MIL/uL — ABNORMAL LOW (ref 3.87–5.11)
RDW: 15.9 % — ABNORMAL HIGH (ref 11.5–15.5)
RDW: 17.4 % — ABNORMAL HIGH (ref 11.5–15.5)
WBC: 7.9 10*3/uL (ref 4.0–10.5)
WBC: 9.9 10*3/uL (ref 4.0–10.5)
nRBC: 1 % — ABNORMAL HIGH (ref 0.0–0.2)
nRBC: 1.6 % — ABNORMAL HIGH (ref 0.0–0.2)

## 2020-05-06 LAB — BASIC METABOLIC PANEL
Anion gap: 9 (ref 5–15)
BUN: 25 mg/dL — ABNORMAL HIGH (ref 8–23)
CO2: 18 mmol/L — ABNORMAL LOW (ref 22–32)
Calcium: 7.5 mg/dL — ABNORMAL LOW (ref 8.9–10.3)
Chloride: 108 mmol/L (ref 98–111)
Creatinine, Ser: 2.32 mg/dL — ABNORMAL HIGH (ref 0.44–1.00)
GFR calc Af Amer: 25 mL/min — ABNORMAL LOW (ref >60)
GFR calc non Af Amer: 21 mL/min — ABNORMAL LOW (ref >60)
Glucose, Bld: 133 mg/dL — ABNORMAL HIGH (ref 70–99)
Potassium: 4.8 mmol/L (ref 3.5–5.1)
Sodium: 135 mmol/L (ref 135–145)

## 2020-05-06 LAB — PREPARE RBC (CROSSMATCH)

## 2020-05-06 MED ORDER — SODIUM CHLORIDE 0.9% IV SOLUTION: Freq: Once | INTRAVENOUS | Status: DC

## 2020-05-06 MED ORDER — LACTULOSE 10 GM/15ML PO SOLN
20.0000 g | Freq: Two times a day (BID) | ORAL | Status: DC
  Administered 2020-05-06 – 2020-05-12 (×9): 20 g via ORAL
  Filled 2020-05-06 (×11): qty 30

## 2020-05-06 MED ORDER — SODIUM CHLORIDE 0.9 % IV SOLN
50.0000 ug/h | INTRAVENOUS | Status: DC
  Administered 2020-05-06 – 2020-05-08 (×4): 50 ug/h via INTRAVENOUS
  Filled 2020-05-06 (×4): qty 1

## 2020-05-06 MED ORDER — SODIUM CHLORIDE 0.9 % IV SOLN: INTRAVENOUS | Status: DC

## 2020-05-06 MED ORDER — RIFAXIMIN 550 MG PO TABS
550.0000 mg | ORAL_TABLET | Freq: Two times a day (BID) | ORAL | Status: DC
  Administered 2020-05-08 – 2020-05-12 (×6): 550 mg via ORAL
  Filled 2020-05-06 (×13): qty 1

## 2020-05-06 NOTE — Progress Notes (Signed)
   ORTHOPAEDIC PROGRESS NOTE  s/p Procedure(s): LEFT INTRAMEDULLARY (IM) NAIL HUMERAL on 8/18/20021 with Dr. Griffin Basil  SUBJECTIVE: Patient resting in hospital bed. Pain better than yesterday. Denies any numbness and tingling. Denies any lower extremity pain or swelling. No chest pain or shortness of breath.   OBJECTIVE: PE: General: resting in hospital bed, alert & oriented, no acute distress Left upper extremity: Dressing CDI - minimal strike-through noted. Continued swelling and bruising of left arm. Compartments compressible. She endorses axillary nerve sensation. + Motor in  AIN, PIN, Ulnar distributions.  Sensation intact in medial, radial, and ulnar distributions. Well perfused digits.   Vitals:   05/06/20 0300 05/06/20 0400  BP:  (!) 111/38  Pulse:  100  Resp:    Temp:  98.6 F (37 C)  SpO2: 93% 95%    ASSESSMENT: Tina Ramos is a 64 y.o. female status post IMN left humerus. POD#2  PLAN: Weightbearing: NWB LUE   - Pendulums start week 2   - Active assisted ROM starting week 3 Insicional and dressing care: Reinforce dressings as needed Orthopedic device(s): Sling for 6 weeks Showering: Post-op day #2 with assistance VTE prophylaxis: SCDs. Ambulation. Due to patient's history of bilateral subdural hematomas and CKD stage IV we will defer to medicine for further DVT prophylaxis. No orthopedic contraindications.  Pain control: PRN pain medications. Preferring oral medications Follow - up plan: 1 week in office for incision check and xray Contact information:  Weekdays 8-5 Noemi Chapel PA-C [phone], After hours and holidays please check [email or web address] for group call information for [facility]  Dispo: TBD. OT recommending SNF. Pending PT eval and medical stabilization. From orthopedic standpoint, patient okay for discharge once cleared by therapy and medicine team.   Noemi Chapel, PA-C 05/06/2020

## 2020-05-06 NOTE — Progress Notes (Signed)
Physical Therapy Treatment Patient Details Name: Tina Ramos MRN: [id number] DOB: Jul 04, 1956 Today's Date: 05/06/2020    History of Present Illness This is a 64 year old female with a history of cirrhosis, diabetes, hyperlipidemia who presents with left arm pain.  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 D/C'd home. Back to ED on 8/16 suffering a fall while she was in her kitchen.  She states she was at the refrigerator and started to fall hitting the drywall with her head and right shoulder then sliding down onto her butt.Reports 4-5 falls per day since 8/9. ZD:GUYQI acute bilateral subdural hematomas which extend along the tentorium, right greater than left.  She has nondisplaced bilateral nasal bone fractures which will likely not require any ENT intervention.Imaging of left UE fracture showing increased displacement.  There is concern for possible compartment syndrome since the left upper extremity is extremely swollen and patient with complaints of severe pain and paresthesias.  Pt underwent IM nail to L humerous on 8/18.    PT Comments    Pt received in bed. Understandably sluggish and tired due to Hgb 6.4. Pt also with increased resting HR of 129. Session, therefore, limited to transfer to recliner. She required max assist supine to sit, min assist sit to stand, and mod assist stand pivot transfer bed to recliner. Increased time/assist required to stabilize sitting balance EOB prior to transfer to recliner. Max HR 149 during mobility with return to 129 seated in recliner. Pt in recliner at end of session with feet elevated. LUE in sling and positioned on pillow for elevation/support.    Follow Up Recommendations  SNF;Supervision/Assistance - 24 hour     Equipment Recommendations  Other (comment) (TBD)    Recommendations for Other Services       Precautions / Restrictions  Precautions Precautions: Fall;Shoulder Type of Shoulder Precautions: AROM to L fingers, wrist and elbow only.  No active ROM or movement to the L shoulder for next two weeks.  Shoulder Interventions: Shoulder sling/immobilizer Required Braces or Orthoses: Sling Restrictions LUE Weight Bearing: Non weight bearing    Mobility  Bed Mobility Overal bed mobility: Needs Assistance Bed Mobility: Supine to Sit     Supine to sit: Max assist     General bed mobility comments: heavy posterior lean with transition to EOB, significant amount of time required to stabilize sitting balance. Pt without c/o dizziness.  Transfers Overall transfer level: Needs assistance Equipment used: 1 person hand held assist Transfers: Sit to/from [facility] Sit to Stand: Min assist Stand pivot transfers: Mod assist       General transfer comment: assist to stabilize balance, cues for sequencing, unsteady  Ambulation/Gait                 Stairs             Wheelchair Mobility    Modified Rankin (Stroke Patients Only)       Balance Overall balance assessment: Needs assistance Sitting-balance support: Feet supported;Single extremity supported Sitting balance-Leahy Scale: Poor     Standing balance support: Single extremity supported;During functional activity Standing balance-Leahy Scale: Poor Standing balance comment: reliant on external support                            Cognition Arousal/Alertness: Awake/alert (mildly lethargic) Behavior During Therapy: Flat affect Overall Cognitive Status:  Within Functional Limits for tasks assessed                                 General Comments: mildly impulsive, difficulty sequencing      Exercises      General Comments General comments (skin integrity, edema, etc.): tachy with resting HR 129 and max HR during mobility 149. Hgb 6.4 awaiting MD order for probable transfusion.       Pertinent Vitals/Pain Pain Assessment: Faces Faces Pain Scale: Hurts even more Pain Location: LUE Pain Descriptors / Indicators: Grimacing;Discomfort;Sore Pain Intervention(s): Limited activity within patient's tolerance;Monitored during session;Repositioned;Premedicated before session    Home Living                      Prior Function            PT Goals (current goals can now be found in the care plan section) Acute Rehab PT Goals Patient Stated Goal: not stated Progress towards PT goals: Progressing toward goals    Frequency    Min 3X/week      PT Plan Current plan remains appropriate    Co-evaluation              AM-PAC PT "6 Clicks" Mobility   Outcome Measure  Help needed turning from your back to your side while in a flat bed without using bedrails?: A Lot Help needed moving from lying on your back to sitting on the side of a flat bed without using bedrails?: A Lot Help needed moving to and from a bed to a chair (including a wheelchair)?: A Little Help needed standing up from a chair using your arms (e.g., wheelchair or bedside chair)?: A Little Help needed to walk in hospital room?: A Lot Help needed climbing 3-5 steps with a railing? : Total 6 Click Score: 13    End of Session Equipment Utilized During Treatment: Gait belt;Other (comment) (sling) Activity Tolerance: Treatment limited secondary to medical complications (Comment) (tachy, Hgb 6.4) Patient left: in chair;with call bell/phone within reach;with chair alarm set Nurse Communication: Mobility status PT Visit Diagnosis: Unsteadiness on feet (R26.81);Repeated falls (R29.6);Pain Pain - Right/Left: Left Pain - part of body: Arm     Time: 1761-6073 PT Time Calculation (min) (ACUTE ONLY): 20 min  Charges:  $Therapeutic Activity: 8-22 mins                     Lorrin Goodell, PT  Office # [phone] Pager [phone]    Lorriane Shire 05/06/2020, 9:53 AM

## 2020-05-06 NOTE — Social Work (Addendum)
RE:  Marcelyn Ruppe  Date of Birth: August 20, 1956  Date:  05/06/2020    To Whom It May Concern:  Please be advised that the above-named patient will require a short-term nursing home stay - anticipated 30 days or less for rehabilitation and strengthening.  The plan is for return home.

## 2020-05-06 NOTE — Progress Notes (Signed)
PROGRESS NOTE    [name]  UEA:[id number] DOB: 16-Jan-1956 DOA: 05/02/2020 PCP: [facility]   Brief Narrative:  Tina Ramos is a 64 y.o. female with medical history significant of liver cirrhosis, insulin-dependent type 2 diabetes, hyperlipidemia, gastroparesis, CKD stage IV presented to the ED after sustaining head injury from a fall.  Patient was recently evaluated in the ED on 8/9 after a fall and was diagnosed with a mildly displaced spiral fracture of the proximal diaphysis of the left humerus. she has not been able to see orthopedics yet.   Complaint of extremely swollen and painful left upper extremity associated with numbness and tingling in her fingers in the same extremity. she has stage IV kidney disease and has a dialysis fistula in this arm. she has had multiple falls in the past 2 weeks.  On average she was following 4-5 times a day.  Each time she describes it as just falling on the ground all of a sudden. Denied preceding lightheadedness/dizziness, chest pain, shortness of breath, or palpitations she hit her head and could feel warm blood going down her face.  Denied history of seizures.  Patient states her 76 year old father takes care of her and is concerned that she is falling so frequently.  She takes a baby aspirin daily.  Upon arrival to ED, vital signs stable in the ED.  Labs showing no leukocytosis.  Hemoglobin 7.7, MCV 103.8.  Hemoglobin was 10.4 on labs done in March 2021.  Creatinine 3.2, stable compared to labs done 5 months ago.  INR 1.1.  SARS-CoV-2 PCR test negative.  UA not suggestive of infection.  MRSA PCR screen negative.  Head CT showing acute bilateral subdural hematomas which extend along the tentorium, right greater than left.  Right posterior parietal scalp laceration and hematoma.    CT maxillofacial showing nondisplaced bilateral nasal bone fractures.  CT C-spine negative for acute abnormality.  X-ray of right shoulder  negative for acute fracture or dislocation.  Chest x-ray showing no acute intrathoracic process.  Showing known displaced left humeral diaphyseal fracture, partially imaged on the study.  Increased displacement since prior exam.  ED provider discussed the case with Dr. Trenton Gammon from neurosurgery who felt that the subdural hematomas were small.  He recommended neurochecks and potential discharge from the ED.  Admission requested as it is not safe for the patient to go home given frequency of falls.  Will need PT and OT evaluation.  Scalp laceration was repaired in the ED.  Foley catheter was placed as patient does self-catheterization at home.  Patient received fentanyl, IV fluid, and Tdap booster  Assessment & Plan:   Principal Problem:   Subdural hematoma (HCC) Active Problems:   Humerus fracture   Scalp laceration   Nasal bone fractures   Falls frequently   Acute bilateral subdural hematomas secondary to a fall: Patient takes aspirin 81 mg daily at home.  Head CT showing acute bilateral subdural hematomas which extend along the tentorium, right greater than left.  ED provider discussed the case with neurosurgery and and it was felt that the hematomas were small and neurosurgery in fact recommended discharging patient home however due to frequent falls, she was admitted under hospital service.  There is no consultation note from neurosurgery.  Repeat CT head stable.   Displaced spiral fracture of the proximal diaphysis of the left humerus: Diagnosed after a fall on 8/9.  Imaging done this hospitalization showing increased displacement. Now status post left humeral shaft  intramedullary nail fixation 05/04/2020.  Scalp laceration -Repaired in the ED. Tdap booster was given.  Nondisplaced bilateral nasal bone fractures: Seen on CT. ENT follow-up as outpatient.  Frequent falls: Etiology unknown.  Likely due to deconditioning or vasovagal.  No event on telemetry noted so far.  Echo  with normal ejection fraction and no wall motion abnormality and no valvular heart disease.  EEG negative for any epileptiform discharges.  Seen by PT OT and they recommend SNF.  Patient agreeable to go to SNF as well.  Acute on chronic anemia/hematemesis: Hemoglobin 7.7, MCV 103.8.  Hemoglobin was 10.4 on labs done in March 2021.  Patient is not endorsing any symptoms of GI bleed but she tells me that she bruises easily because it " a woman's skin".  Does have significant ecchymosis to her chest wall and left upper extremity from her recent fall which could possibly be contributing to the dropping hemoglobin to some degree.  Reportedly, she had one episode of vomiting which was black on late morning of 05/04/2020.  Patient denies having any history of GI bleed in the past but she has history of GERD for which she takes Protonix.  Hemoglobin was repeated which was stable.  Hemoglobin today 7.2.  No further episodes like this.  Unfortunately, sample was not taken for occult blood.  I have ordered fecal occult blood test which is still pending since last 2 days and now her hemoglobin dropped to 6.4 today so I am going to transfuse her 1 unit of PRBC and I have consulted GI to evaluate her for possible EGD.  Hyperlipidemia: Holding atorvastatin for now.  Essential hypertension: Blood pressure Now controlled. -Continue hydralazine PRN SBP >170.   Insulin-dependent type 2 diabetes: Hemoglobin A1c 6.0.  Controlled blood sugar now.  Continue SSI.  Mood disorder: -Resume home medications  CKD stage IV: Renal function at baseline. -Resume home Renvela.  DVT prophylaxis: SCDs Start: 05/04/20 1640 SCDs Start: 05/03/20 0325   Code Status: Full Code  Family Communication: None present at bedside.  Plan of care discussed with patient in length and she verbalized understanding and agreed with it.  Status is: Inpatient  Remains inpatient appropriate because:Inpatient level of care appropriate due to  severity of illness   Dispo: The patient is from: Home              Anticipated d/c is to: SNF              Anticipated d/c date is:2- 3 days              Patient currently is not medically stable to d/c.        Estimated body mass index is 25.06 kg/m as calculated from the following:   Height as of 04/25/20: 5\' 2"  (1.575 m).   Weight as of 04/25/20: 62.1 kg.      Nutritional status:               Consultants:   Orthopedics  GI  Procedures:  left humeral shaft intramedullary nail fixation 05/04/2020.  Antimicrobials:  Anti-infectives (From admission, onward)   Start     Dose/Rate Route Frequency Ordered Stop   05/04/20 1700  ceFAZolin (ANCEF) IVPB 1 g/50 mL premix        1 g 100 mL/hr over 30 Minutes Intravenous Every 6 hours 05/04/20 1639 05/05/20 0514   05/04/20 1349  vancomycin (VANCOCIN) powder  Status:  Discontinued          As  needed 05/04/20 1349 05/04/20 1520   05/04/20 1314  ceFAZolin (ANCEF) 2-4 GM/100ML-% IVPB  Status:  Discontinued       Note to Pharmacy: Laurita Quint   : cabinet override      05/04/20 1314 05/04/20 1532   05/04/20 1000  ceFAZolin (ANCEF) IVPB 2g/100 mL premix        2 g 200 mL/hr over 30 Minutes Intravenous  Once 05/03/20 1316 05/04/20 0949         Subjective: Seen and examined.  Only complaint is left upper extremity pain but that is getting better.  No other complaint.  Objective: Vitals:   05/06/20 0800 05/06/20 0900 05/06/20 1055 05/06/20 1115  BP: (!) 113/47 (!) 105/52 (!) 119/44 (!) 109/56  Pulse: (!) 131 (!) 128 (!) 126 (!) 127  Resp: (!) 21 (!) 21 (!) 23 (!) 22  Temp: 99.9 F (37.7 C)  98.5 F (36.9 C) 99.4 F (37.4 C)  TempSrc: Oral  Oral Axillary  SpO2: 94% 92% 95% 94%    Intake/Output Summary (Last 24 hours) at 05/06/2020 1238 Last data filed at 05/05/2020 2025 Gross per 24 hour  Intake --  Output 600 ml  Net -600 ml   There were no vitals filed for this visit.  Examination:  General exam:  Appears calm and comfortable  Respiratory system: Clear to auscultation. Respiratory effort normal. Cardiovascular system: S1 & S2 heard, RRR. No JVD, murmurs, rubs, gallops or clicks. No pedal edema. Gastrointestinal system: Abdomen is nondistended, soft and nontender. No organomegaly or masses felt. Normal bowel sounds heard. Central nervous system: Alert and oriented. No focal neurological deficits. Skin: Large bruise left upper extremity and left anterior chest.  Data Reviewed: I have personally reviewed following labs and imaging studies  CBC: Recent Labs  Lab 05/04/20 1200 05/04/20 2001 05/05/20 0836 05/05/20 1837 05/06/20 0601  WBC 10.4 8.2 11.5* 11.5* 7.9  NEUTROABS 7.1 6.9 8.4* 7.4 5.2  HGB 8.7* 7.6* 7.2* 7.0* 6.4*  HCT 28.1* 25.4* 23.3* 22.9* 21.4*  MCV 103.3* 107.6* 106.9* 108.0* 109.2*  PLT 190 190 170 201 633   Basic Metabolic Panel: Recent Labs  Lab 05/02/20 1908 05/04/20 0810 05/05/20 0836 05/06/20 0601  NA 137 139 141 135  K 4.3 5.1 4.7 4.8  CL 103 104 109 108  CO2 22 21* 19* 18*  GLUCOSE 127* 122* 130* 133*  BUN 48* 34* 28* 25*  CREATININE 3.28* 2.66* 2.39* 2.32*  CALCIUM 8.2* 8.2* 7.7* 7.5*  MG  --  2.1 2.0  --   PHOS  --  2.4*  --   --    GFR: Estimated Creatinine Clearance: 21 mL/min (A) (by C-G formula based on SCr of 2.32 mg/dL (H)). Liver Function Tests: Recent Labs  Lab 05/02/20 1908  AST 29  ALT 17  ALKPHOS 60  BILITOT 0.7  PROT 6.7  ALBUMIN 3.6   No results for input(s): LIPASE, AMYLASE in the last 168 hours. No results for input(s): AMMONIA in the last 168 hours. Coagulation Profile: Recent Labs  Lab 05/02/20 1928  INR 1.1   Cardiac Enzymes: No results for input(s): CKTOTAL, CKMB, CKMBINDEX, TROPONINI in the last 168 hours. BNP (last 3 results) No results for input(s): PROBNP in the last 8760 hours. HbA1C: No results for input(s): HGBA1C in the last 72 hours. CBG: Recent Labs  Lab 05/05/20 0919 05/05/20 1226  05/05/20 1605 05/05/20 2204 05/06/20 0740  GLUCAP 118* 147* 148* 119* 128*   Lipid Profile: No results for input(s):  CHOL, HDL, LDLCALC, TRIG, CHOLHDL, LDLDIRECT in the last 72 hours. Thyroid Function Tests: No results for input(s): TSH, T4TOTAL, FREET4, T3FREE, THYROIDAB in the last 72 hours. Anemia Panel: No results for input(s): VITAMINB12, FOLATE, FERRITIN, TIBC, IRON, RETICCTPCT in the last 72 hours. Sepsis Labs: No results for input(s): PROCALCITON, LATICACIDVEN in the last 168 hours.  Recent Results (from the past 240 hour(s))  SARS Coronavirus 2 by RT PCR (hospital order, performed in [facility] hospital lab) Nasopharyngeal Nasopharyngeal Swab     Status: None   Collection Time: 05/02/20  7:28 PM   Specimen: Nasopharyngeal Swab  Result Value Ref Range Status   SARS Coronavirus 2 NEGATIVE NEGATIVE Final    Comment: (NOTE) SARS-CoV-2 target nucleic acids are NOT DETECTED.  The SARS-CoV-2 RNA is generally detectable in upper and lower respiratory specimens during the acute phase of infection. The lowest concentration of SARS-CoV-2 viral copies this assay can detect is 250 copies / mL. A negative result does not preclude SARS-CoV-2 infection and should not be used as the sole basis for treatment or other patient management decisions.  A negative result may occur with improper specimen collection / handling, submission of specimen other than nasopharyngeal swab, presence of viral mutation(s) within the areas targeted by this assay, and inadequate number of viral copies (<250 copies / mL). A negative result must be combined with clinical observations, patient history, and epidemiological information.  Fact Sheet for Patients:   StrictlyIdeas.no  Fact Sheet for Healthcare Providers: BankingDealers.co.za  This test is not yet approved or  cleared by the [place] FDA and has been authorized for detection and/or diagnosis of  SARS-CoV-2 by FDA under an Emergency Use Authorization (EUA).  This EUA will remain in effect (meaning this test can be used) for the duration of the COVID-19 declaration under Section 564(b)(1) of the Act, 21 U.S.C. section 360bbb-3(b)(1), unless the authorization is terminated or revoked sooner.  Performed at [facility], [address] [place], [place] [address]   MRSA PCR Screening     Status: None   Collection Time: 05/02/20 11:40 PM   Specimen: Nasal Mucosa; Nasopharyngeal  Result Value Ref Range Status   MRSA by PCR NEGATIVE NEGATIVE Final    Comment:        The GeneXpert MRSA Assay (FDA approved for NASAL specimens only), is one component of a comprehensive MRSA colonization surveillance program. It is not intended to diagnose MRSA infection nor to guide or monitor treatment for MRSA infections. Performed at [facility], [place] [address], [place],  [address]       Radiology Studies: CT HEAD WO CONTRAST  Result Date: 05/05/2020 CLINICAL DATA:  Subdural hematoma. EXAM: CT HEAD WITHOUT CONTRAST TECHNIQUE: Contiguous axial images were obtained from the base of the skull through the vertex without intravenous contrast. COMPARISON:  05/03/2020 FINDINGS: Brain: A small subdural hematoma along both leaflets of the tentorium has not significantly changed in size, measuring up to 5 mm in thickness on the right and 3 mm on the left. There is no associated mass effect. No new intracranial hemorrhage, acute infarct, mass, or midline shift is identified. The ventricles and sulci are normal. Vascular: Calcified atherosclerosis at the skull base. Skull: No fracture or suspicious osseous lesion. Sinuses/Orbits: Visualized paranasal sinuses and mastoid air cells are clear. Bilateral cataract extraction. Other: Persistent small right parietal scalp hematoma with skin staples in place. IMPRESSION: 1. Unchanged small tentorial subdural hematoma. 2. No new intracranial  abnormality.  Electronically Signed   By: Logan Bores M.D.   On: 05/05/2020 12:49   DG Humerus Left  Result Date: 05/04/2020 CLINICAL DATA:  Fixation of left humerus fracture. EXAM: LEFT HUMERUS - 2+ VIEW; DG C-ARM 1-60 MIN COMPARISON:  CT scan 05/03/2020 FINDINGS: Intraoperative spot films and postoperative radiographs demonstrate an intramedullary rod in the humerus with 2 proximal and 1 distal interlocking screws. This is transfixing the spiral type proximal humeral shaft fracture. Mild displacement of the fracture is demonstrated. IMPRESSION: Internal fixation of the proximal humeral shaft fracture. Electronically Signed   By: Marijo Sanes M.D.   On: 05/04/2020 16:49   DG Humerus Left  Result Date: 05/04/2020 CLINICAL DATA:  Fixation of left humerus fracture. EXAM: LEFT HUMERUS - 2+ VIEW; DG C-ARM 1-60 MIN COMPARISON:  CT scan 05/03/2020 FINDINGS: Intraoperative spot films and postoperative radiographs demonstrate an intramedullary rod in the humerus with 2 proximal and 1 distal interlocking screws. This is transfixing the spiral type proximal humeral shaft fracture. Mild displacement of the fracture is demonstrated. IMPRESSION: Internal fixation of the proximal humeral shaft fracture. Electronically Signed   By: Marijo Sanes M.D.   On: 05/04/2020 16:49   DG C-Arm 1-60 Min  Result Date: 05/04/2020 CLINICAL DATA:  Fixation of left humerus fracture. EXAM: LEFT HUMERUS - 2+ VIEW; DG C-ARM 1-60 MIN COMPARISON:  CT scan 05/03/2020 FINDINGS: Intraoperative spot films and postoperative radiographs demonstrate an intramedullary rod in the humerus with 2 proximal and 1 distal interlocking screws. This is transfixing the spiral type proximal humeral shaft fracture. Mild displacement of the fracture is demonstrated. IMPRESSION: Internal fixation of the proximal humeral shaft fracture. Electronically Signed   By: Marijo Sanes M.D.   On: 05/04/2020 16:49    Scheduled Meds: . sodium chloride   Intravenous  Once  . atorvastatin  20 mg Oral Daily  . Chlorhexidine Gluconate Cloth  6 each Topical Daily  . docusate sodium  100 mg Oral BID  . gabapentin  100 mg Oral TID  . insulin aspart  0-9 Units Subcutaneous TID WC  . pantoprazole (PROTONIX) IV  40 mg Intravenous Q12H  . QUEtiapine  300 mg Oral QHS  . sevelamer carbonate  1,600 mg Oral TID WC  . torsemide  20 mg Oral Once per day on Mon Wed Fri  . venlafaxine XR  75 mg Oral Q breakfast   Continuous Infusions: . sodium chloride 75 mL/hr at 05/04/20 1716     LOS: 3 days   Time spent: 28 minutes   Darliss Cheney, MD [facility]  05/06/2020, 12:38 PM   To contact the attending provider between 7A-7P or the covering provider during after hours 7P-7A, please log into the web site [email or web address].

## 2020-05-06 NOTE — Progress Notes (Signed)
Referring Provider: Dr. Darliss Cheney Primary Care Physician:  [facility], [facility] Primary Gastroenterologist: Althia Forts ([place])  Reason for Consultation:  Hematemesis, anemia  HPI: Tina Ramos is a 64 y.o. female with past medical history of cirrhosis, achalasia s/p POEM (08/2018), DM type 2 with gastroparesis, CKD, currently hospitalized for acute bilateral subdural hematomas and displaced left humeral spiral fracture presenting for consultation of hematemesis.  Patient reports 1 episode of black emesis on 05/04/2020.  Denies any further episodes of nausea or emesis, though her hemoglobin has been persistently dropping.  Denies melena or hematochezia.  Reports fatigue and shortness of breath.  Denies chest pain.  Patient further denies dysphagia, GERD, decreased appetite, early satiety, unexplained weight loss, abdominal pain, or changes in bowel habits.  Patient has a history of cirrhosis but denies any prior episodes of GI bleeding.  States she is not on any outpatient medications for cirrhosis.   She is on 81 mg aspirin daily but denies NSAID or blood thinner use.  Denies alcohol use.  Denies family history of hepatic or gastrointestinal malignancy.  Records reviewed:  Last EGD was 01/15/2020.  EGD report unavailable via Care Everywhere, though pathology report available.  Chronic gastritis without H. pylori, metaplasia, or dysplasia.  Acute Candida esophagitis.  Progress note from office visit states EGD was pertinent for Candida esophagitis.  No mention of esophageal varices, portal hypertension, PUD, etc.  Per chart review, appears patient has had multiple dilations for but benign-appearing esophageal stricture.  Manometry 04/2018 consistent with type III achalasia.  Palm completed 08/2018.   [place] 01/22/20: Innumerable hypoechoic nodules scattered throughout the liver.  While these may represent regenerative nodules, given the presence  of underlying cirrhosis a  nonemergent contrast enhanced MRI is  recommended for further evaluation.     Past Medical History:  Diagnosis Date  . Chronic kidney disease   . Cirrhosis ([place])   . Cirrhosis ([place])   . Diabetes mellitus without complication ([place])   . Gastroparesis   . Hyperlipemia   . Interstitial cystitis   . Migraine   . UTI (lower urinary tract infection)     Past Surgical History:  Procedure Laterality Date  . ABDOMINAL HYSTERECTOMY    . ABDOMINAL SURGERY    . APPENDECTOMY    . ESOPHAGEAL DILATION    . FINGER SURGERY Left   . HUMERUS IM NAIL Left 05/04/2020   Procedure: LEFT INTRAMEDULLARY (IM) NAIL HUMERAL;  Surgeon: Hiram Gash, MD;  Location: [place];  Service: Orthopedics;  Laterality: Left;  . TOE SURGERY      Prior to Admission medications   Medication Sig Start Date End Date Taking? Authorizing Provider  aspirin EC 81 MG tablet Take 81 mg by mouth daily.   Yes [provider]  atorvastatin (LIPITOR) 20 MG tablet Take 20 mg by mouth daily.   Yes [provider]  clonazePAM (KLONOPIN) 1 MG tablet Take 1 mg by mouth 2 (two) times daily as needed for anxiety.  03/02/20  Yes [provider]  desvenlafaxine (PRISTIQ) 50 MG 24 hr tablet Take 50 mg by mouth daily. 03/28/20  Yes [provider]  estradiol (ESTRACE) 1 MG tablet Take 1 mg by mouth daily.   Yes [provider]  gabapentin (NEURONTIN) 100 MG capsule Take 100 mg by mouth 3 (three) times daily.   Yes [provider]  insulin glargine (LANTUS) 100 UNIT/ML injection Inject 0-20 Units into the skin See admin instructions. Sliding scale: if BG  is less than  < 150, 0 units and if > 150, 4 units. Pt increases by 1 unit per increase of 25 mmol/L in BG.   Yes [provider]  oxybutynin (DITROPAN XL) 15 MG 24 hr tablet Take 1 tablet by mouth daily as needed (overactive bladder).  05/02/20  Yes [provider]  pantoprazole (PROTONIX) 40 MG tablet Take 40 mg by mouth  daily.   Yes [provider]  QUEtiapine (SEROQUEL) 100 MG tablet Take 300 mg by mouth at bedtime.    Yes [provider]  rizatriptan (MAXALT) 10 MG tablet Take 10-20 mg by mouth daily as needed for migraine.    Yes [provider]  sevelamer carbonate (RENVELA) 800 MG tablet Take 1,600 mg by mouth 3 (three) times daily with meals.    Yes [provider]  torsemide (DEMADEX) 20 MG tablet Take 20 mg by mouth 3 (three) times a week. 04/15/20  Yes [provider]  fexofenadine (ALLEGRA) 60 MG tablet Take 1 tablet (60 mg total) by mouth 2 (two) times daily. Patient not taking: Reported on 05/03/2020 01/23/14   Malvin Johns, MD  HYDROcodone-acetaminophen (NORCO/VICODIN) 5-325 MG tablet Take 1-2 tablets by mouth every 6 (six) hours as needed. Patient not taking: Reported on 05/03/2020 04/26/20   Horton, Barbette Hair, MD  ibuprofen (ADVIL,MOTRIN) 600 MG tablet Take 1 tablet (600 mg total) by mouth every 6 (six) hours as needed. Patient not taking: Reported on 05/03/2020 11/27/14   Varney Biles, MD  mupirocin cream (BACTROBAN) 2 % Apply 1 application topically 2 (two) times daily. For 7 days Patient not taking: Reported on 05/03/2020 03/17/15   Malvin Johns, MD  ondansetron (ZOFRAN ODT) 4 MG disintegrating tablet 4mg  ODT q4 hours prn nausea/vomit Patient not taking: Reported on 05/03/2020 10/02/18   Deno Etienne, DO  promethazine (PHENERGAN) 25 MG tablet Take 1 tablet (25 mg total) by mouth every 6 (six) hours as needed for nausea. Patient not taking: Reported on 05/03/2020 11/27/14   Varney Biles, MD    Scheduled Meds: . sodium chloride   Intravenous Once  . atorvastatin  20 mg Oral Daily  . Chlorhexidine Gluconate Cloth  6 each Topical Daily  . docusate sodium  100 mg Oral BID  . gabapentin  100 mg Oral TID  . insulin aspart  0-9 Units Subcutaneous TID WC  . pantoprazole (PROTONIX) IV  40 mg Intravenous Q12H  . QUEtiapine  300 mg Oral QHS  . sevelamer  carbonate  1,600 mg Oral TID WC  . torsemide  20 mg Oral Once per day on Mon Wed Fri  . venlafaxine XR  75 mg Oral Q breakfast   Continuous Infusions: . sodium chloride 75 mL/hr at 05/04/20 1716   PRN Meds:.acetaminophen **OR** acetaminophen, bisacodyl, clonazePAM, diphenhydrAMINE, hydrALAZINE, HYDROmorphone (DILAUDID) injection, labetalol, naphazoline-glycerin, ondansetron **OR** ondansetron (ZOFRAN) IV, oxybutynin, oxyCODONE, oxyCODONE, polyethylene glycol  Allergies as of 05/02/2020 - Review Complete 05/02/2020  Allergen Reaction Noted  . Darvon [propoxyphene]  09/23/2013  . Sulfa antibiotics  08/05/2013  . Talwin [pentazocine]  09/23/2013  . Latex Itching and Rash 09/23/2013    History reviewed. No pertinent family history.  Social History   Socioeconomic History  . Marital status: Single    Spouse name: Not on file  . Number of children: Not on file  . Years of education: Not on file  . Highest education level: Not on file  Occupational History  . Not on file  Tobacco Use  . Smoking  status: Never Smoker  . Smokeless tobacco: Never Used  Vaping Use  . Vaping Use: Never used  Substance and Sexual Activity  . Alcohol use: No  . Drug use: No  . Sexual activity: Not on file  Other Topics Concern  . Not on file  Social History Narrative  . Not on file   Social Determinants of Health   Financial Resource Strain:   . Difficulty of Paying Living Expenses: Not on file  Food Insecurity:   . Worried About Charity fundraiser in the Last Year: Not on file  . Ran Out of Food in the Last Year: Not on file  Transportation Needs:   . Lack of Transportation (Medical): Not on file  . Lack of Transportation (Non-Medical): Not on file  Physical Activity:   . Days of Exercise per Week: Not on file  . Minutes of Exercise per Session: Not on file  Stress:   . Feeling of Stress : Not on file  Social Connections:   . Frequency of Communication with Friends and Family: Not on  file  . Frequency of Social Gatherings with Friends and Family: Not on file  . Attends Religious Services: Not on file  . Active Member of Clubs or Organizations: Not on file  . Attends Archivist Meetings: Not on file  . Marital Status: Not on file  Intimate Partner Violence:   . Fear of Current or Ex-Partner: Not on file  . Emotionally Abused: Not on file  . Physically Abused: Not on file  . Sexually Abused: Not on file    Review of Systems: Review of Systems  Constitutional: Positive for malaise/fatigue. Negative for chills, fever and weight loss.  HENT: Negative for hearing loss and tinnitus.   Eyes: Negative for pain and redness.  Respiratory: Positive for shortness of breath. Negative for cough.   Cardiovascular: Negative for chest pain and palpitations.  Gastrointestinal: Positive for nausea and vomiting. Negative for abdominal pain, blood in stool, constipation, diarrhea, heartburn and melena.  Genitourinary: Negative for flank pain and hematuria.  Musculoskeletal: Positive for falls. Negative for joint pain.  Skin: Negative for itching and rash.  Neurological: Negative for seizures and loss of consciousness.  Endo/Heme/Allergies: Negative for polydipsia. Does not bruise/bleed easily.  Psychiatric/Behavioral: Negative for substance abuse. The patient is not nervous/anxious.      Physical Exam: Vital signs: Vitals:   05/06/20 1055 05/06/20 1115  BP: (!) 119/44 (!) 109/56  Pulse: (!) 126 (!) 127  Resp: (!) 23 (!) 22  Temp: 98.5 F (36.9 C) 99.4 F (37.4 C)  SpO2: 95% 94%     Physical Exam Vitals reviewed.  Constitutional:      General: She is not in acute distress. HENT:     Head: Normocephalic and atraumatic.     Nose: Nose normal.     Mouth/Throat:     Mouth: Mucous membranes are moist.     Pharynx: Oropharynx is clear.  Eyes:     General: No scleral icterus.    Extraocular Movements: Extraocular movements intact.     Comments: Conjunctival  pallor  Cardiovascular:     Rate and Rhythm: Regular rhythm. Tachycardia present.     Pulses: Normal pulses.  Pulmonary:     Effort: Tachypnea present.     Breath sounds: Normal breath sounds.  Abdominal:     General: Bowel sounds are normal. There is no distension.     Palpations: Abdomen is soft. There is no mass.  Tenderness: There is no abdominal tenderness. There is no guarding or rebound.     Hernia: No hernia is present.  Musculoskeletal:        General: No tenderness or deformity.     Cervical back: Normal range of motion and neck supple.     Right lower leg: No edema.     Left lower leg: No edema.  Skin:    General: Skin is warm and dry.  Neurological:     General: No focal deficit present.     Mental Status: She is oriented to person, place, and time. She is lethargic.  Psychiatric:        Mood and Affect: Mood normal.        Behavior: Behavior normal. Behavior is cooperative.      GI:  Lab Results: Recent Labs    05/05/20 0836 05/05/20 1837 05/06/20 0601  WBC 11.5* 11.5* 7.9  HGB 7.2* 7.0* 6.4*  HCT 23.3* 22.9* 21.4*  PLT 170 201 154   BMET Recent Labs    05/04/20 0810 05/05/20 0836 05/06/20 0601  NA 139 141 135  K 5.1 4.7 4.8  CL 104 109 108  CO2 21* 19* 18*  GLUCOSE 122* 130* 133*  BUN 34* 28* 25*  CREATININE 2.66* 2.39* 2.32*  CALCIUM 8.2* 7.7* 7.5*   LFT No results for input(s): PROT, ALBUMIN, AST, ALT, ALKPHOS, BILITOT, BILIDIR, IBILI in the last 72 hours. PT/INR No results for input(s): LABPROT, INR in the last 72 hours.   Studies/Results: CT HEAD WO CONTRAST  Result Date: 05/05/2020 CLINICAL DATA:  Subdural hematoma. EXAM: CT HEAD WITHOUT CONTRAST TECHNIQUE: Contiguous axial images were obtained from the base of the skull through the vertex without intravenous contrast. COMPARISON:  05/03/2020 FINDINGS: Brain: A small subdural hematoma along both leaflets of the tentorium has not significantly changed in size, measuring up to 5 mm  in thickness on the right and 3 mm on the left. There is no associated mass effect. No new intracranial hemorrhage, acute infarct, mass, or midline shift is identified. The ventricles and sulci are normal. Vascular: Calcified atherosclerosis at the skull base. Skull: No fracture or suspicious osseous lesion. Sinuses/Orbits: Visualized paranasal sinuses and mastoid air cells are clear. Bilateral cataract extraction. Other: Persistent small right parietal scalp hematoma with skin staples in place. IMPRESSION: 1. Unchanged small tentorial subdural hematoma. 2. No new intracranial abnormality. Electronically Signed   By: Logan Bores M.D.   On: 05/05/2020 12:49   DG Humerus Left  Result Date: 05/04/2020 CLINICAL DATA:  Fixation of left humerus fracture. EXAM: LEFT HUMERUS - 2+ VIEW; DG C-ARM 1-60 MIN COMPARISON:  CT scan 05/03/2020 FINDINGS: Intraoperative spot films and postoperative radiographs demonstrate an intramedullary rod in the humerus with 2 proximal and 1 distal interlocking screws. This is transfixing the spiral type proximal humeral shaft fracture. Mild displacement of the fracture is demonstrated. IMPRESSION: Internal fixation of the proximal humeral shaft fracture. Electronically Signed   By: Marijo Sanes M.D.   On: 05/04/2020 16:49   DG Humerus Left  Result Date: 05/04/2020 CLINICAL DATA:  Fixation of left humerus fracture. EXAM: LEFT HUMERUS - 2+ VIEW; DG C-ARM 1-60 MIN COMPARISON:  CT scan 05/03/2020 FINDINGS: Intraoperative spot films and postoperative radiographs demonstrate an intramedullary rod in the humerus with 2 proximal and 1 distal interlocking screws. This is transfixing the spiral type proximal humeral shaft fracture. Mild displacement of the fracture is demonstrated. IMPRESSION: Internal fixation of the proximal humeral shaft fracture. Electronically Signed  By: Marijo Sanes M.D.   On: 05/04/2020 16:49   DG C-Arm 1-60 Min  Result Date: 05/04/2020 CLINICAL DATA:  Fixation of  left humerus fracture. EXAM: LEFT HUMERUS - 2+ VIEW; DG C-ARM 1-60 MIN COMPARISON:  CT scan 05/03/2020 FINDINGS: Intraoperative spot films and postoperative radiographs demonstrate an intramedullary rod in the humerus with 2 proximal and 1 distal interlocking screws. This is transfixing the spiral type proximal humeral shaft fracture. Mild displacement of the fracture is demonstrated. IMPRESSION: Internal fixation of the proximal humeral shaft fracture. Electronically Signed   By: Marijo Sanes M.D.   On: 05/04/2020 16:49    Impression: Suspected upper GI bleeding: Hematemesis x 1, worsening anemia. PUD vs. Gastritis vs. Portal hypertensive gastropathy.  Esophageal varices less likely.  -Hgb 6.4 today, as compared to 7.0 yesterday and 7.6 on 8/18 -Cirrhosis but no history of GI bleeding  Cirrhosis per CT and US imaging: compensated -Platelets 154 K/uL -INR 1.1 as of 05/02/20 -LFTs normal as of 05/02/20  History of achalasia s/p POEM (08/2018)  DM type 2 with history of gastroparesis  CKD: BUN 25/ Cr 2.32  Plan: EGD tomorrow.   I thoroughly discussed the procedure to include nature, alternatives, benefits, and risks (including but not limited to bleeding, infection, perforation, anesthesia/cardiac and pulmonary complications).  Pt verbalized understanding and gave verbal consent to proceed with EGD.  Continue Protonix 40 mg IV BID.  Start octreotide infusion.  Continue to monitor H&H with transfusion as needed to maintain Hgb >7.  [facility] will follow.   LOS: 3 days   Salley Slaughter  PA-C 05/06/2020, 1:33 PM  Contact #  [phone]

## 2020-05-06 NOTE — NC FL2 (Signed)
[place] LEVEL OF CARE SCREENING TOOL     IDENTIFICATION  Patient Name: Tina Ramos Birthdate: Oct 13, 1955 Sex: female Admission Date (Current Location): 05/02/2020  [facility] and [place] Number:  Herbalist and Address:  [facility], [place] [address] [place], [place] [address]      Provider Number: [id number]  Attending Physician Name and Address:  Darliss Cheney, MD  Relative Name and Phone Number:       Current Level of Care: Hospital Recommended Level of Care: [place] Prior Approval Number:    Date Approved/Denied:   PASRR Number: Pending  Discharge Plan: SNF    Current Diagnoses: Patient Active Problem List   Diagnosis Date Noted  . Humerus fracture 05/03/2020  . Scalp laceration 05/03/2020  . Nasal bone fractures 05/03/2020  . Falls frequently 05/03/2020  . Subdural hematoma ([place]) 05/02/2020    Orientation RESPIRATION BLADDER Height & Weight     Self, Time, Situation, Place  Normal Continent Weight:   Height:     BEHAVIORAL SYMPTOMS/MOOD NEUROLOGICAL BOWEL NUTRITION STATUS      Continent Diet  AMBULATORY STATUS COMMUNICATION OF NEEDS Skin   Extensive Assist Verbally Skin abrasions                       Personal Care Assistance Level of Assistance  Bathing, Feeding, Dressing Bathing Assistance: Maximum assistance Feeding assistance: Limited assistance Dressing Assistance: Maximum assistance     Functional Limitations Info  Speech, Hearing, Sight Sight Info: Adequate Hearing Info: Adequate Speech Info: Adequate    SPECIAL CARE FACTORS FREQUENCY  OT (By licensed OT), PT (By licensed PT)     PT Frequency: 5x a week OT Frequency: 5x a week            Contractures Contractures Info: Not present    Additional Factors Info  Code Status, Allergies Code Status Info: full Allergies Info: Pentazocine Propoxyphene Sulfa Antibiotics Codeine Other           Current  Medications (05/06/2020):  This is the current hospital active medication list Current Facility-Administered Medications  Medication Dose Route Frequency Provider Last Rate Last Admin  . 0.9 %  sodium chloride infusion (Manually program via Guardrails IV Fluids)   Intravenous Once Darliss Cheney, MD      . 0.9 %  sodium chloride infusion   Intravenous Continuous Ethelda Chick, PA-C 75 mL/hr at 05/04/20 1716 New Bag at 05/04/20 1716  . acetaminophen (TYLENOL) tablet 650 mg  650 mg Oral Q6H PRN McBane, Maylene Roes, PA-C       Or  . acetaminophen (TYLENOL) suppository 650 mg  650 mg Rectal Q6H PRN McBane, Maylene Roes, PA-C      . atorvastatin (LIPITOR) tablet 20 mg  20 mg Oral Daily Darliss Cheney, MD   20 mg at 05/06/20 0915  . bisacodyl (DULCOLAX) EC tablet 5 mg  5 mg Oral Daily PRN McBane, Maylene Roes, PA-C      . Chlorhexidine Gluconate Cloth 2 % PADS 6 each  6 each Topical Daily Ethelda Chick, PA-C   6 each at 05/05/20 8119  . clonazePAM (KLONOPIN) tablet 1 mg  1 mg Oral BID PRN Darliss Cheney, MD   1 mg at 05/06/20 0915  . diphenhydrAMINE (BENADRYL) 12.5 MG/5ML elixir 12.5-25 mg  12.5-25 mg Oral Q4H PRN McBane, Caroline N, PA-C      . docusate sodium (COLACE) capsule 100 mg  100  mg Oral BID Ethelda Chick, PA-C   100 mg at 05/06/20 0915  . gabapentin (NEURONTIN) capsule 100 mg  100 mg Oral TID Darliss Cheney, MD   100 mg at 05/06/20 0915  . hydrALAZINE (APRESOLINE) injection 5 mg  5 mg Intravenous Q4H PRN Ethelda Chick, PA-C   5 mg at 05/04/20 0912  . HYDROmorphone (DILAUDID) injection 0.5-1 mg  0.5-1 mg Intravenous Q4H PRN Ethelda Chick, PA-C   1 mg at 05/06/20 0127  . insulin aspart (novoLOG) injection 0-9 Units  0-9 Units Subcutaneous TID WC McBane, Caroline N, PA-C   1 Units at 05/04/20 1717  . labetalol (NORMODYNE) injection 10 mg  10 mg Intravenous Q4H PRN Ethelda Chick, PA-C   10 mg at 05/05/20 0446  . lactulose (CHRONULAC) 10 GM/15ML solution 20 g  20 g Oral BID  Ronnette Juniper, MD      . naphazoline-glycerin (CLEAR EYES REDNESS) ophth solution 1-2 drop  1-2 drop Left Eye QID PRN Darliss Cheney, MD      . octreotide (SANDOSTATIN) 500 mcg in sodium chloride 0.9 % 250 mL (2 mcg/mL) infusion  50 mcg/hr Intravenous Continuous Baron-Johnson, Alison, PA-C      . ondansetron (ZOFRAN) tablet 4 mg  4 mg Oral Q6H PRN Ethelda Chick, PA-C   4 mg at 05/05/20 1922   Or  . ondansetron (ZOFRAN) injection 4 mg  4 mg Intravenous Q6H PRN Ethelda Chick, PA-C   4 mg at 05/04/20 2142  . oxybutynin (DITROPAN XL) 24 hr tablet 15 mg  15 mg Oral Daily PRN Darliss Cheney, MD      . oxyCODONE (Oxy IR/ROXICODONE) immediate release tablet 10 mg  10 mg Oral Q4H PRN Ethelda Chick, PA-C   10 mg at 05/05/20 1635  . oxyCODONE (Oxy IR/ROXICODONE) immediate release tablet 5 mg  5 mg Oral Q4H PRN McBane, Maylene Roes, PA-C      . pantoprazole (PROTONIX) injection 40 mg  40 mg Intravenous Q12H Ethelda Chick, PA-C   40 mg at 05/06/20 0915  . QUEtiapine (SEROQUEL) tablet 300 mg  300 mg Oral QHS Darliss Cheney, MD   300 mg at 05/05/20 2130  . rifaximin (XIFAXAN) tablet 550 mg  550 mg Oral BID Ronnette Juniper, MD      . sevelamer carbonate (RENVELA) tablet 1,600 mg  1,600 mg Oral TID WC Darliss Cheney, MD   1,600 mg at 05/06/20 0921  . torsemide (DEMADEX) tablet 20 mg  20 mg Oral Once per day on Mon Wed Fri Pahwani, Ravi, MD      . venlafaxine XR [facility]) 24 hr capsule 75 mg  75 mg Oral Q breakfast Darliss Cheney, MD   75 mg at 05/06/20 0915     Discharge Medications: Please see discharge summary for a list of discharge medications.  Relevant Imaging Results:  Relevant Lab Results:   Additional Information ssn: [id number]  Emeterio Reeve, [place]

## 2020-05-06 NOTE — TOC Initial Note (Addendum)
Transition of Care [facility]) - Initial/Assessment Note    Patient Details  Name: Tina Ramos MRN: [id number] Date of Birth: November 03, 1955  [facility]) CM/SW Contact:    Emeterio Reeve, [place] Phone Number: 05/06/2020, 3:51 PM  Clinical Narrative:                  CSW met with pt at bedside. CSW introduced self and explained her role at the hospital.  Pt stated PTA she was living at home and was independent.   CSW reviewed pt reccs of SNF. PT stated she is open to SNF. Pt reports she has not been to SNF in the past. Pt stated she is open csw faxing her out to other facilities in the area.  Pt reports she has had the covid vaccine. CSW has completed fl2 and faxed pt out. PASSR pending.  Expected Discharge Plan: Skilled Nursing Facility Barriers to Discharge: Continued Medical Work up   Patient Goals and CMS Choice Patient states their goals for this hospitalization and ongoing recovery are:: To return independent CMS Medicare.gov Compare Post Acute Care list provided to:: Patient Choice offered to / list presented to : Patient  Expected Discharge Plan and Services Expected Discharge Plan: [place] arrangements for the past 2 months: Single Family Home                                      Prior Living Arrangements/Services Living arrangements for the past 2 months: Single Family Home Lives with:: Self Patient language and need for interpreter reviewed:: Yes Do you feel safe going back to the place where you live?: Yes      Need for Family Participation in Patient Care: Yes (Comment) Care giver support system in place?: Yes (comment)   Criminal Activity/Legal Involvement Pertinent to Current Situation/Hospitalization: No - Comment as needed  Activities of Daily Living      Permission Sought/Granted Permission sought to share information with : Chartered certified accountant granted to share information with :  Yes, Verbal Permission Granted     Permission granted to share info w AGENCY: SNF        Emotional Assessment Appearance:: Appears stated age Attitude/Demeanor/Rapport: Engaged Affect (typically observed): Appropriate Orientation: : Oriented to Self, Oriented to Situation, Oriented to Place, Oriented to  Time Alcohol / Substance Use: Not Applicable Psych Involvement: No (comment)  Admission diagnosis:  Subdural hematoma ([place]) [S06.5X9A] Injury of head, initial encounter [S09.90XA] Fall, initial encounter [W19.XXXA] Patient Active Problem List   Diagnosis Date Noted  . Humerus fracture 05/03/2020  . Scalp laceration 05/03/2020  . Nasal bone fractures 05/03/2020  . Falls frequently 05/03/2020  . Subdural hematoma ([place]) 05/02/2020   PCP:  [facility] [place] [facility]:   [place], [place] - [address] [place] [address] Phone: [phone] Fax: [phone]     Social Determinants of Health (SDOH) Interventions    Readmission Risk Interventions No flowsheet data found.  Emeterio Reeve, Latanya Presser, [place] Social Worker [phone]

## 2020-05-07 ENCOUNTER — Inpatient Hospital Stay (HOSPITAL_COMMUNITY): Payer: Medicare Other | Admitting: Certified Registered Nurse Anesthetist

## 2020-05-07 ENCOUNTER — Encounter (HOSPITAL_COMMUNITY): Payer: Self-pay | Admitting: Internal Medicine

## 2020-05-07 ENCOUNTER — Encounter (HOSPITAL_COMMUNITY)
Admission: EM | Disposition: A | Discharge status: Skilled Nursing Facility | Payer: Self-pay | Source: Home / Self Care | Attending: Family Medicine

## 2020-05-07 HISTORY — PX: ESOPHAGOGASTRODUODENOSCOPY (EGD) WITH PROPOFOL: SHX5813

## 2020-05-07 LAB — TYPE AND SCREEN
ABO/RH(D): O POS
Antibody Screen: NEGATIVE
Unit division: 0

## 2020-05-07 LAB — BASIC METABOLIC PANEL
Anion gap: 9 (ref 5–15)
BUN: 28 mg/dL — ABNORMAL HIGH (ref 8–23)
CO2: 22 mmol/L (ref 22–32)
Calcium: 7.9 mg/dL — ABNORMAL LOW (ref 8.9–10.3)
Chloride: 105 mmol/L (ref 98–111)
Creatinine, Ser: 2.61 mg/dL — ABNORMAL HIGH (ref 0.44–1.00)
GFR calc Af Amer: 21 mL/min — ABNORMAL LOW (ref >60)
GFR calc non Af Amer: 19 mL/min — ABNORMAL LOW (ref >60)
Glucose, Bld: 156 mg/dL — ABNORMAL HIGH (ref 70–99)
Potassium: 5.3 mmol/L — ABNORMAL HIGH (ref 3.5–5.1)
Sodium: 136 mmol/L (ref 135–145)

## 2020-05-07 LAB — PROTIME-INR
INR: 1.7 — ABNORMAL HIGH (ref 0.8–1.2)
Prothrombin Time: 19.5 seconds — ABNORMAL HIGH (ref 11.4–15.2)

## 2020-05-07 LAB — CBC
HCT: 29.5 % — ABNORMAL LOW (ref 36.0–46.0)
Hemoglobin: 9.7 g/dL — ABNORMAL LOW (ref 12.0–15.0)
MCH: 32.9 pg (ref 26.0–34.0)
MCHC: 32.9 g/dL (ref 30.0–36.0)
MCV: 100 fL (ref 80.0–100.0)
Platelets: 194 10*3/uL (ref 150–400)
RBC: 2.95 MIL/uL — ABNORMAL LOW (ref 3.87–5.11)
RDW: 17.2 % — ABNORMAL HIGH (ref 11.5–15.5)
WBC: 10.6 10*3/uL — ABNORMAL HIGH (ref 4.0–10.5)
nRBC: 1 % — ABNORMAL HIGH (ref 0.0–0.2)

## 2020-05-07 LAB — GLUCOSE, CAPILLARY
Glucose-Capillary: 139 mg/dL — ABNORMAL HIGH (ref 70–99)
Glucose-Capillary: 168 mg/dL — ABNORMAL HIGH (ref 70–99)
Glucose-Capillary: 138 mg/dL — ABNORMAL HIGH (ref 70–99)

## 2020-05-07 LAB — BPAM RBC
Blood Product Expiration Date: 202109192359
ISSUE DATE / TIME: 202108201032
Unit Type and Rh: 5100

## 2020-05-07 SURGERY — ESOPHAGOGASTRODUODENOSCOPY (EGD) WITH PROPOFOL
Anesthesia: Monitor Anesthesia Care

## 2020-05-07 MED ORDER — LACTATED RINGERS IV SOLN: INTRAVENOUS | Status: DC | PRN

## 2020-05-07 MED ORDER — OXYCODONE HCL 5 MG PO TABS: ORAL_TABLET | ORAL | 0 refills | Status: AC

## 2020-05-07 MED ORDER — PROPOFOL 10 MG/ML IV BOLUS
INTRAVENOUS | Status: DC | PRN
  Administered 2020-05-07 (×3): 20 mg via INTRAVENOUS

## 2020-05-07 MED ORDER — PROPOFOL 500 MG/50ML IV EMUL
INTRAVENOUS | Status: DC | PRN
  Administered 2020-05-07: 100 ug/kg/min via INTRAVENOUS

## 2020-05-07 NOTE — Op Note (Signed)
[facility] Patient Name: Tina Ramos Procedure Date : 05/07/2020 MRN: [id number] Attending MD: Ronald Lobo , MD Date of Birth: June 24, 1956 CSN: [id number] Age: 64 Admit Type: Inpatient Procedure:                Upper GI endoscopy Indications:              Recent coffee-ground emesis and progressive drop in                            hemoglobin in a patient who carries the diagnosis                            of cirrhosis Providers:                Ronald Lobo, MD, Benay Pillow, RN, Elspeth Cho Tech., Technician, Ladona Ridgel,                            Technician Referring MD:              Medicines:                Monitored Anesthesia Care Complications:            No immediate complications. Estimated Blood Loss:     Estimated blood loss: none. Procedure:                Pre-Anesthesia Assessment:                           - Prior to the procedure, a History and Physical                            was performed, and patient medications and                            allergies were reviewed. The patient's tolerance of                            previous anesthesia was also reviewed. The risks                            and benefits of the procedure and the sedation                            options and risks were discussed with the patient.                            All questions were answered, and informed consent                            was obtained. Prior Anticoagulants: The patient has                            taken no previous  anticoagulant or antiplatelet                            agents. ASA Grade Assessment: III - A patient with                            severe systemic disease. After reviewing the risks                            and benefits, the patient was deemed in                            satisfactory condition to undergo the procedure.                           After obtaining informed consent, the  endoscope was                            passed under direct vision. Throughout the                            procedure, the patient's blood pressure, pulse, and                            oxygen saturations were monitored continuously. The                            [id number]-H190 ([id number]) Olympus gastroscope was                            introduced through the mouth, and advanced to the                            antrum of the stomach. The upper GI endoscopy was                            accomplished without difficulty. The patient                            tolerated the procedure well. Scope In: Scope Out: Findings:      The larynx was partially seen and appeared grossly normal.      The examined esophagus was normal. Small amounts of the mucosa were       obscured by refluxed gastric contents.      There is no endoscopic evidence of inflammation, varices or       Mallory-Weiss tear in the entire esophagus.      A large amount of food (residue--mostly opaque liquid, some solid       debris) was found on the greater curvature of the stomach, obscuring       approximately 25% of the gastric surface area including the cardia on       retroflexion. There was no blood present in the gastric lumen. I       suctioned out about 200 mL of this liquid, but could not get the stomach  empty due to particulate matter clogging the scope. Especially with the       patient in the supine position (as necessitated by her fractured left       humerus), I felt she was at risk for aspiration of gastric contents into       the lungs, so I terminated the procedure without attempting to enter the       duodenum. A retroflexed view of the cardia showed puddled fluid,       obscuring the cardia and hence I cannot exclude the presence of gastric       varices in this patient.      No evidence of portal gastropathy, ulcers, or other source of coffee       ground emesis seen on the visualized portions of the  esophagus and       stomach. Impression:               - No active bleeding or blood in the stomach at the                            time of this exam.                           - Limited exam due to large amount of retained                            gastric contents.                           - Grossly normal larynx.                           - Normal esophagus.                           - A large amount of food (residue) in the stomach                            but no source of bleeding seen in the visualized                            portions. Some reflux of gastric contents into                            esophagus without evidence of aspiration during                            procedure.                           - No evidence of portal hypertension noted                            endoscopically on this limited exam.                           - Although the duodenum was not entered during this  exam, it did appear that non-bloody contents were                            refluxing from it into the stomach, so that would                            suggest there is no active post-pyloric bleeding at                            the time of this exam.                           - No specimens collected. Recommendation:           OK to stop octreotide, continue pantoprazole,                            temporarily change to full liquid diet until                            gastric emptying is improved (?less pain medication                            requirement). Consider repeat egd if evidence of                            further bleeding. Procedure Code(s):        --- Professional ---                           [phone], 52, Esophagogastroduodenoscopy, flexible,                            transoral; diagnostic, including collection of                            specimen(s) by brushing or washing, when performed                            (separate  procedure) Diagnosis Code(s):        --- Professional ---                           K92.0, Hematemesis CPT copyright 2019 American Medical Association. All rights reserved. The codes documented in this report are preliminary and upon coder review may  be revised to meet current compliance requirements. Ronald Lobo, MD 05/07/2020 12:04:46 PM This report has been signed electronically. Number of Addenda: 0

## 2020-05-07 NOTE — Progress Notes (Signed)
   ORTHOPAEDIC PROGRESS NOTE  s/p Procedure(s): LEFT INTRAMEDULLARY (IM) NAIL HUMERAL on 8/18/20021 with Dr. Griffin Basil  SUBJECTIVE: Patient resting in hospital bed. Scheduled for endoscopy with Dr. Cristina Gong at 10:45 AM today. Hgb dropped to 6.4 yesterday. She received transfusion. Moderate pain in left arm.   OBJECTIVE: PE: General: resting in hospital bed, alert & oriented, no acute distress Left upper extremity: Dressing CDI - minimal strike-through noted. Continued swelling and bruising of left arm - seems to be improving. Compartments compressible. She endorses axillary nerve sensation. + Motor in  AIN, PIN, Ulnar distributions.  Sensation intact in medial, radial, and ulnar distributions. Well perfused digits.   Vitals:   05/07/20 0400 05/07/20 0749  BP: 132/60 (!) 128/57  Pulse: (!) 124 (!) 123  Resp: 18 (!) 22  Temp: 100.3 F (37.9 C) 98.9 F (37.2 C)  SpO2: 96% 94%    ASSESSMENT: Tina Ramos is a 64 y.o. female status post IMN left humerus. POD#3  PLAN: Weightbearing: NWB LUE   - Pendulums start week 2   - Active assisted ROM starting week 3 Insicional and dressing care: Reinforce dressings as needed Orthopedic device(s): Sling for 6 weeks Showering: Post-op day #2 with assistance VTE prophylaxis: SCDs. Ambulation. Due to patient's history of bilateral subdural hematomas and CKD stage IV we will defer to medicine for further DVT prophylaxis. No orthopedic contraindications.  Pain control: PRN pain medications. Preferring oral medications Follow - up plan: 1 week in office for incision check and xray Contact information: After hours and holidays please check [email or web address] for group call information for [facility]  Dispo: TBD. PT/OT recommending SNF. Currently not medically stable. EGD planned for today. Patient agreeable to SNF. Printed and placed prescription for pain medications for discharge.    Noemi Chapel, PA-C 05/07/2020

## 2020-05-07 NOTE — Progress Notes (Signed)
Provider notified of yellow MEWS. HR and RR throughout the day. Patient denies distress other than shoulder pain for which she was medicated. Pt attempted to I&o cath herself, on her insistence, but she ended up inducing a large urine occurrence in the bed. Bladder scan showed 395 several house later. Htx of interstitial cystitis. Provider also notified of this and I&0 cath protocol to be started as of now. Simmie Davies RN

## 2020-05-07 NOTE — Progress Notes (Signed)
PROGRESS NOTE    Tina Ramos  WNU:[id number] DOB: 11-01-1955 DOA: 05/02/2020 PCP: [facility]   Brief Narrative:  Tina Ramos is a 64 y.o. female with medical history significant of liver cirrhosis, insulin-dependent type 2 diabetes, hyperlipidemia, gastroparesis, CKD stage IV presented to the ED after sustaining head injury from a fall.  Patient was recently evaluated in the ED on 8/9 after a fall and was diagnosed with a mildly displaced spiral fracture of the proximal diaphysis of the left humerus. she was not able to see orthopedics yet.   Complained of extremely swollen and painful left upper extremity associated with numbness and tingling in her fingers in the same extremity. she had multiple falls in the past 2 weeks.  Each time she describes it as just falling on the ground all of a sudden. Denied prodromal symptoms or any history of seizure. her 45 year old father takes care of her. She takes a baby aspirin daily.  Upon arrival to ED she was a stable with COVID-19 negative. Head CT showing acute bilateral subdural hematomas which extend along the tentorium, right greater than left.  Right posterior parietal scalp laceration and hematoma.  CT maxillofacial showing nondisplaced bilateral nasal bone fractures. CT C-spine negative for acute abnormality. X-ray of right shoulder negative for acute fracture or dislocation. Chest x-ray showing no acute intrathoracic process.    Extremity x-ray shows known displaced left humeral diaphyseal fracture.  ED provider discussed the case with Dr. Trenton Gammon from neurosurgery who felt that the subdural hematomas were small.  He recommended neurochecks and potential discharge from the ED. Admission requested as it was not safe for the patient to go home given frequency of falls.  Scalp laceration was repaired in the ED. Foley catheter was placed as patient does self-catheterization at home. Patient received fentanyl, IV fluid, and Tdap  booster.  Admitted under hospitalist service.  Orthopedics were consulted to see her on urgent basis for concern of possible compartment syndrome in the left upper extremity.  They ruled out compartment syndrome.  Subsequently she underwent  left humeral shaft intramedullary nail fixation 05/04/2020.  Repeat head CT showed stable subdural hematomas. she had one episode of vomiting which was thought to be black on late morning of 05/04/2020.  Patient denied having any history of GI bleed in the past but she has history of GERD for which she takes Protonix.  Hemoglobin was repeated which was stable.  For some reason, sample was not taken for occult blood.  Subsequently over the course of next 2 days, her hemoglobin dropped to 6.4 on 05/06/2020 and she received 1 unit of PRBC transfusion and GI was consulted and she underwent EGD on 05/07/2020 which was negative for any acute bleeding or any pathology   Assessment & Plan:   Principal Problem:   Subdural hematoma (HCC) Active Problems:   Humerus fracture   Scalp laceration   Nasal bone fractures   Falls frequently   Acute bilateral subdural hematomas secondary to a fall: Patient takes aspirin 81 mg daily at home.  Head CT showing acute bilateral subdural hematomas which extend along the tentorium, right greater than left.  ED provider discussed the case with neurosurgery and and it was felt that the hematomas were small and neurosurgery in fact recommended discharging patient home however due to frequent falls, she was admitted under hospital service.  There is no consultation note from neurosurgery.  Repeat CT head stable.   Displaced spiral fracture of the proximal diaphysis  of the left humerus: Diagnosed after a fall on 8/9.  Imaging done this hospitalization showing increased displacement. Now status post left humeral shaft intramedullary nail fixation 05/04/2020.  Scalp laceration -Repaired in the ED. Tdap booster was given.  Nondisplaced  bilateral nasal bone fractures: Seen on CT. ENT follow-up as outpatient.  Frequent falls: Etiology unknown.  Likely due to deconditioning or vasovagal.  No event on telemetry noted so far.  Echo with normal ejection fraction and no wall motion abnormality and no valvular heart disease.  EEG negative for any epileptiform discharges.  Seen by PT OT and they recommend SNF.  Patient agreeable to go to SNF as well.  Acute on chronic anemia/hematemesis: Hemoglobin 7.7, MCV 103.8.  Hemoglobin was 10.4 on labs done in March 2021.  Patient is not endorsing any symptoms of GI bleed but she tells me that she bruises easily because it " a woman's skin".  Does have significant ecchymosis to her chest wall and left upper extremity from her recent fall which could possibly be contributing to the dropping hemoglobin to some degree.  Reportedly, she had one episode of vomiting which was black on late morning of 05/04/2020.  Patient denies having any history of GI bleed in the past but she has history of GERD for which she takes Protonix.  Subsequently her hemoglobin dropped to 6.4 on 05/06/2020.  She received 1 unit of PRBC transfusion.  GI was consulted.  She was started on octreotide and underwent EGD today which is unremarkable.  Hyperlipidemia: Holding atorvastatin for now.  Essential hypertension: Blood pressure Now controlled. -Continue hydralazine PRN SBP >170.   Insulin-dependent type 2 diabetes: Hemoglobin A1c 6.0.  Controlled blood sugar now.  Continue SSI.  Mood disorder: -Resume home medications  CKD stage IV: Renal function at baseline. -Resume home Renvela.  DVT prophylaxis: SCDs Start: 05/04/20 1640 SCDs Start: 05/03/20 0325   Code Status: Full Code  Family Communication: None present at bedside.  Plan of care discussed with patient in length and she verbalized understanding and agreed with it.  Status is: Inpatient  Remains inpatient appropriate because:Inpatient level of care  appropriate due to severity of illness   Dispo: The patient is from: Home              Anticipated d/c is to: SNF              Anticipated d/c date is: 1 to 2 days              Patient currently is medically stable to d/c.        Estimated body mass index is 25.06 kg/m as calculated from the following:   Height as of 04/25/20: 5\' 2"  (1.575 m).   Weight as of 04/25/20: 62.1 kg.      Nutritional status:               Consultants:   Orthopedics  GI  Procedures:  left humeral shaft intramedullary nail fixation 05/04/2020. EGD 05/07/2020  Antimicrobials:  Anti-infectives (From admission, onward)   Start     Dose/Rate Route Frequency Ordered Stop   05/06/20 1800  rifaximin (XIFAXAN) tablet 550 mg        550 mg Oral 2 times daily 05/06/20 1538     05/04/20 1700  ceFAZolin (ANCEF) IVPB 1 g/50 mL premix        1 g 100 mL/hr over 30 Minutes Intravenous Every 6 hours 05/04/20 1639 05/05/20 0514   05/04/20 1349  vancomycin (  VANCOCIN) powder  Status:  Discontinued          As needed 05/04/20 1349 05/04/20 1520   05/04/20 1314  ceFAZolin (ANCEF) 2-4 GM/100ML-% IVPB  Status:  Discontinued       Note to Pharmacy: Laurita Quint   : cabinet override      05/04/20 1314 05/04/20 1532   05/04/20 1000  ceFAZolin (ANCEF) IVPB 2g/100 mL premix        2 g 200 mL/hr over 30 Minutes Intravenous  Once 05/03/20 1316 05/04/20 0949         Subjective: Seen and examined before EGD today.  She had no complaint.  Pain very well controlled.  Objective: Vitals:   05/07/20 0749 05/07/20 1058 05/07/20 1145 05/07/20 1200  BP: (!) 128/57 (!) 138/39 (!) 128/42 (!) 134/51  Pulse: (!) 123 (!) 122 (!) 113 (!) 117  Resp: (!) 22 (!) 23 (!) 24 (!) 26  Temp: 98.9 F (37.2 C) 98.9 F (37.2 C) 98.6 F (37 C) 98.4 F (36.9 C)  TempSrc: Oral Oral  Oral  SpO2: 94% 96% 98% 95%    Intake/Output Summary (Last 24 hours) at 05/07/2020 1306 Last data filed at 05/07/2020 1140 Gross per 24 hour    Intake 1469.93 ml  Output --  Net 1469.93 ml   There were no vitals filed for this visit.  Examination:  General exam: Appears calm and comfortable  Respiratory system: Clear to auscultation. Respiratory effort normal. Cardiovascular system: S1 & S2 heard, RRR. No JVD, murmurs, rubs, gallops or clicks. No pedal edema. Gastrointestinal system: Abdomen is nondistended, soft and nontender. No organomegaly or masses felt. Normal bowel sounds heard. Central nervous system: Alert and oriented. No focal neurological deficits. Skin: Large bruise in the left upper extremity and left anterior chest. Psychiatry: Judgement and insight appear normal. Mood & affect appropriate.   Data Reviewed: I have personally reviewed following labs and imaging studies  CBC: Recent Labs  Lab 05/04/20 2001 05/05/20 0836 05/05/20 1837 05/06/20 0601 05/06/20 1855  WBC 8.2 11.5* 11.5* 7.9 9.9  NEUTROABS 6.9 8.4* 7.4 5.2 5.7  HGB 7.6* 7.2* 7.0* 6.4* 9.0*  HCT 25.4* 23.3* 22.9* 21.4* 27.3*  MCV 107.6* 106.9* 108.0* 109.2* 101.9*  PLT 190 170 201 154 938   Basic Metabolic Panel: Recent Labs  Lab 05/02/20 1908 05/04/20 0810 05/05/20 0836 05/06/20 0601 05/07/20 0514  NA 137 139 141 135 136  K 4.3 5.1 4.7 4.8 5.3*  CL 103 104 109 108 105  CO2 22 21* 19* 18* 22  GLUCOSE 127* 122* 130* 133* 156*  BUN 48* 34* 28* 25* 28*  CREATININE 3.28* 2.66* 2.39* 2.32* 2.61*  CALCIUM 8.2* 8.2* 7.7* 7.5* 7.9*  MG  --  2.1 2.0  --   --   PHOS  --  2.4*  --   --   --    GFR: Estimated Creatinine Clearance: 18.6 mL/min (A) (by C-G formula based on SCr of 2.61 mg/dL (H)). Liver Function Tests: Recent Labs  Lab 05/02/20 1908  AST 29  ALT 17  ALKPHOS 60  BILITOT 0.7  PROT 6.7  ALBUMIN 3.6   No results for input(s): LIPASE, AMYLASE in the last 168 hours. No results for input(s): AMMONIA in the last 168 hours. Coagulation Profile: Recent Labs  Lab 05/02/20 1928 05/07/20 0514  INR 1.1 1.7*   Cardiac  Enzymes: No results for input(s): CKTOTAL, CKMB, CKMBINDEX, TROPONINI in the last 168 hours. BNP (last 3 results) No results for  input(s): PROBNP in the last 8760 hours. HbA1C: No results for input(s): HGBA1C in the last 72 hours. CBG: Recent Labs  Lab 05/06/20 0740 05/06/20 1223 05/06/20 1647 05/06/20 2208 05/07/20 0753  GLUCAP 128* 146* 128* 170* 139*   Lipid Profile: No results for input(s): CHOL, HDL, LDLCALC, TRIG, CHOLHDL, LDLDIRECT in the last 72 hours. Thyroid Function Tests: No results for input(s): TSH, T4TOTAL, FREET4, T3FREE, THYROIDAB in the last 72 hours. Anemia Panel: No results for input(s): VITAMINB12, FOLATE, FERRITIN, TIBC, IRON, RETICCTPCT in the last 72 hours. Sepsis Labs: No results for input(s): PROCALCITON, LATICACIDVEN in the last 168 hours.  Recent Results (from the past 240 hour(s))  SARS Coronavirus 2 by RT PCR (hospital order, performed in [facility] hospital lab) Nasopharyngeal Nasopharyngeal Swab     Status: None   Collection Time: 05/02/20  7:28 PM   Specimen: Nasopharyngeal Swab  Result Value Ref Range Status   SARS Coronavirus 2 NEGATIVE NEGATIVE Final    Comment: (NOTE) SARS-CoV-2 target nucleic acids are NOT DETECTED.  The SARS-CoV-2 RNA is generally detectable in upper and lower respiratory specimens during the acute phase of infection. The lowest concentration of SARS-CoV-2 viral copies this assay can detect is 250 copies / mL. A negative result does not preclude SARS-CoV-2 infection and should not be used as the sole basis for treatment or other patient management decisions.  A negative result may occur with improper specimen collection / handling, submission of specimen other than nasopharyngeal swab, presence of viral mutation(s) within the areas targeted by this assay, and inadequate number of viral copies (<250 copies / mL). A negative result must be combined with clinical observations, patient history, and epidemiological  information.  Fact Sheet for Patients:   StrictlyIdeas.no  Fact Sheet for Healthcare Providers: BankingDealers.co.za  This test is not yet approved or  cleared by the [place] FDA and has been authorized for detection and/or diagnosis of SARS-CoV-2 by FDA under an Emergency Use Authorization (EUA).  This EUA will remain in effect (meaning this test can be used) for the duration of the COVID-19 declaration under Section 564(b)(1) of the Act, 21 U.S.C. section 360bbb-3(b)(1), unless the authorization is terminated or revoked sooner.  Performed at [facility], [address] [place], [place] [address]   MRSA PCR Screening     Status: None   Collection Time: 05/02/20 11:40 PM   Specimen: Nasal Mucosa; Nasopharyngeal  Result Value Ref Range Status   MRSA by PCR NEGATIVE NEGATIVE Final    Comment:        The GeneXpert MRSA Assay (FDA approved for NASAL specimens only), is one component of a comprehensive MRSA colonization surveillance program. It is not intended to diagnose MRSA infection nor to guide or monitor treatment for MRSA infections. Performed at [facility], [place] [address], [place], [place] [address]       Radiology Studies: No results found.  Scheduled Meds: . sodium chloride   Intravenous Once  . atorvastatin  20 mg Oral Daily  . Chlorhexidine Gluconate Cloth  6 each Topical Daily  . docusate sodium  100 mg Oral BID  . gabapentin  100 mg Oral TID  . insulin aspart  0-9 Units Subcutaneous TID WC  . lactulose  20 g Oral BID  . pantoprazole (PROTONIX) IV  40 mg Intravenous Q12H  . QUEtiapine  300 mg Oral QHS  . rifaximin  550 mg Oral BID  . sevelamer carbonate  1,600 mg Oral TID  WC  . torsemide  20 mg Oral Once per day on Mon Wed Fri  . venlafaxine XR  75 mg Oral Q breakfast   Continuous Infusions: . sodium chloride 75 mL/hr at 05/07/20 0701  . octreotide  (SANDOSTATIN)    IV infusion  50 mcg/hr (05/07/20 0701)     LOS: 4 days   Time spent: 29 minutes   Darliss Cheney, MD [facility]  05/07/2020, 1:06 PM   To contact the attending provider between 7A-7P or the covering provider during after hours 7P-7A, please log into the web site [email or web address].

## 2020-05-07 NOTE — Anesthesia Preprocedure Evaluation (Addendum)
Anesthesia Evaluation  Patient identified by MRN, date of birth, ID band Patient awake    Reviewed: Allergy & Precautions, H&P , NPO status , Patient's Chart, lab work & pertinent test results  Airway Mallampati: III  TM Distance: >3 FB Neck ROM: Full    Dental no notable dental hx. (+) Teeth Intact, Dental Advisory Given   Pulmonary neg pulmonary ROS,    Pulmonary exam normal breath sounds clear to auscultation       Cardiovascular negative cardio ROS   Rhythm:Regular Rate:Normal     Neuro/Psych  Headaches, negative psych ROS   GI/Hepatic negative GI ROS, (+) Cirrhosis       ,   Endo/Other  diabetes, Insulin Dependent  Renal/GU Renal InsufficiencyRenal disease  negative genitourinary   Musculoskeletal   Abdominal   Peds  Hematology negative hematology ROS (+)   Anesthesia Other Findings   Reproductive/Obstetrics negative OB ROS                            Anesthesia Physical Anesthesia Plan  ASA: III  Anesthesia Plan: MAC   Post-op Pain Management:    Induction: Intravenous  PONV Risk Score and Plan: 2 and Propofol infusion and Treatment may vary due to age or medical condition  Airway Management Planned: Nasal Cannula  Additional Equipment:   Intra-op Plan:   Post-operative Plan:   Informed Consent: I have reviewed the patients History and Physical, chart, labs and discussed the procedure including the risks, benefits and alternatives for the proposed anesthesia with the patient or authorized representative who has indicated his/her understanding and acceptance.     Dental advisory given  Plan Discussed with: CRNA  Anesthesia Plan Comments:         Anesthesia Quick Evaluation

## 2020-05-07 NOTE — Transfer of Care (Signed)
Immediate Anesthesia Transfer of Care Note  Patient: Tina Ramos  Procedure(s) Performed: ESOPHAGOSCOPY (N/A )  Patient Location: PACU  Anesthesia Type:MAC  Level of Consciousness: awake, alert  and oriented  Airway & Oxygen Therapy: Patient Spontanous Breathing  Post-op Assessment: Report given to RN and Post -op Vital signs reviewed and stable  Post vital signs: Reviewed and stable  Last Vitals:  Vitals Value Taken Time  BP 128/42 05/07/20 1146  Temp    Pulse 113 05/07/20 1146  Resp 24 05/07/20 1146  SpO2 98 % 05/07/20 1146  Vitals shown include unvalidated device data.  Last Pain:  Vitals:   05/07/20 1058  TempSrc: Oral  PainSc: 0-No pain      Patients Stated Pain Goal: 2 (01/00/71 2197)  Complications: No complications documented.

## 2020-05-07 NOTE — Progress Notes (Signed)
No new labs today.  Have ordered CBC for this afternoon and tomorrow morning.  No further apparent bleeding.  Endoscopy was limited to esophagus and stomach because of large amount of retained food and fluid in the stomach, placing patient at risk for aspiration since the procedure had to be done with her in the supine position.  I do not think any aspiration occurred during the procedure and her O2 sat never dropped below 100% during the procedure or thereafter.  No blood present in the stomach.  No esophageal varices, ulcers, or portal gastropathy seen on this limited exam.  No evident gastric outlet obstruction.  Impression:  1.  No active bleeding or blood in the stomach at the time of this procedure  2.  Limited exam due to retained food and fluid, suggesting impaired gastric emptying in view of more than a 12-hour fast prior to this procedure.  I suspect this is due to narcotic pain medications causing gastric dysmotility.  3.  No definite source of bleeding identified on this limited exam.  Plan:  1.  I have stopped the patient's octreotide since there is no endoscopic evidence of portal hypertension  2.  Continue empiric PPI therapy for now  3.  For now, I have placed the patient back on a full liquid diet in view of apparent delayed gastric emptying (typically liquids empty out of the stomach much more readily than solids).  4.  I will recheck the patient tomorrow.  Depending on the evolution of her hemoglobin, I might be able to sign off at that time.  If she shows no evidence of further bleeding, I do not think that repeat endoscopy to do a complete exam under more optimal circumstances will be needed.  Cleotis Nipper, M.D. Pager [phone] If no answer or after 5 PM call [phone]

## 2020-05-07 NOTE — Progress Notes (Signed)
Occupational Therapy Treatment Patient Details Name: Tina Ramos MRN: [id number] DOB: April 05, 1956 Today's Date: 05/07/2020    History of present illness This is a 64 year old female with a history of cirrhosis, diabetes, hyperlipidemia who presents with left arm pain.  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 D/C'd home. Back to ED on 8/16 suffering a fall while she was in her kitchen.  She states she was at the refrigerator and started to fall hitting the drywall with her head and right shoulder then sliding down onto her butt.Reports 4-5 falls per day since 8/9. KT:GYBWL acute bilateral subdural hematomas which extend along the tentorium, right greater than left.  She has nondisplaced bilateral nasal bone fractures which will likely not require any ENT intervention.Imaging of left UE fracture showing increased displacement.  There is concern for possible compartment syndrome since the left upper extremity is extremely swollen and patient with complaints of severe pain and paresthesias.  Pt underwent IM nail to L humerous on 8/18.   OT comments  Pt seen for ROM Lt elbow, wrist, and hand.  She tolerated much better today.  She demonstrates full AROM hand and wrist and able to achieve ~115* elbow flexion and ~-20* elbow extension this date.  Will continue to follow.   Follow Up Recommendations  SNF    Equipment Recommendations  None recommended by OT    Recommendations for Other Services      Precautions / Restrictions Precautions Precautions: Fall;Shoulder Type of Shoulder Precautions: AROM to L fingers, wrist and elbow only.  No active ROM or movement to the L shoulder for next two weeks.  Shoulder Interventions: Shoulder sling/immobilizer;At all times;Off for dressing/bathing/exercises Required Braces or Orthoses: Sling Restrictions LUE Weight Bearing: Non weight bearing        Mobility Bed Mobility                  Transfers                      Balance                                           ADL either performed or assessed with clinical judgement   ADL                                               Vision       Perception     Praxis      Cognition Arousal/Alertness: Awake/alert Behavior During Therapy: Flat affect Overall Cognitive Status: Within Functional Limits for tasks assessed                                 General Comments: [facility] for basic info.  Able to tell me she is scheduled for an EGD today         Exercises Exercises: General Upper Extremity General Exercises - Upper Extremity Elbow Flexion: AAROM;AROM;Left;20 reps;Supine Elbow Extension: AAROM;AROM;Left;20 reps;Supine Wrist Flexion: AROM;Left;10 reps;Supine Wrist Extension: AROM;Left;10 reps;Supine Digit Composite Flexion: AROM;Left;10 reps;Supine Composite Extension: AROM;Left;10 reps;Supine   Shoulder Instructions  General Comments      Pertinent Vitals/ Pain       Pain Assessment: Faces Faces Pain Scale: Hurts little more Pain Location: LUE Pain Descriptors / Indicators: Grimacing;Guarding Pain Intervention(s): Monitored during session  Home Living                                          Prior Functioning/Environment              Frequency  Min 2X/week        Progress Toward Goals  OT Goals(current goals can now be found in the care plan section)  Progress towards OT goals: Progressing toward goals     Plan Discharge plan remains appropriate    Co-evaluation                 AM-PAC OT "6 Clicks" Daily Activity     Outcome Measure   Help from another person eating meals?: A Little Help from another person taking care of personal grooming?: A Little Help from another person toileting, which includes using toliet, bedpan, or  urinal?: A Lot Help from another person bathing (including washing, rinsing, drying)?: A Lot Help from another person to put on and taking off regular upper body clothing?: A Lot Help from another person to put on and taking off regular lower body clothing?: A Lot 6 Click Score: 14    End of Session Equipment Utilized During Treatment: Other (comment) (sling )  OT Visit Diagnosis: Unsteadiness on feet (R26.81);Other abnormalities of gait and mobility (R26.89);Repeated falls (R29.6);History of falling (Z91.81);Pain Pain - Right/Left: Left Pain - part of body: Shoulder;Arm   Activity Tolerance Patient tolerated treatment well   Patient Left in bed;with call bell/phone within reach   Nurse Communication          Time: 0921-0931 OT Time Calculation (min): 10 min  Charges: OT General Charges $OT Visit: 1 Visit OT Treatments $Therapeutic Exercise: 8-22 mins  Nilsa Nutting., OTR/L Acute Rehabilitation Services Pager [phone] Office [phone]    Lucille Passy M 05/07/2020, 9:43 AM

## 2020-05-07 NOTE — Anesthesia Postprocedure Evaluation (Signed)
Anesthesia Post Note  Patient: Tina Ramos  Procedure(s) Performed: ESOPHAGOSCOPY (N/A )     Patient location during evaluation: Endoscopy Anesthesia Type: MAC Level of consciousness: awake and alert Pain management: pain level controlled Vital Signs Assessment: post-procedure vital signs reviewed and stable Respiratory status: spontaneous breathing, nonlabored ventilation and respiratory function stable Cardiovascular status: stable and blood pressure returned to baseline Postop Assessment: no apparent nausea or vomiting Anesthetic complications: no   No complications documented.  Last Vitals:  Vitals:   05/07/20 1145 05/07/20 1200  BP: (!) 128/42 (!) 134/51  Pulse: (!) 113 (!) 117  Resp: (!) 24 (!) 26  Temp: 37 C 36.9 C  SpO2: 98% 95%    Last Pain:  Vitals:   05/07/20 1145  TempSrc:   PainSc: 0-No pain                 Mykelti Goldenstein,W. EDMOND

## 2020-05-07 NOTE — Interval H&P Note (Signed)
History and Physical Interval Note:  05/07/2020 11:22 AM  Tina Ramos  has presented today for surgery, with the diagnosis of upper GI bleeding.  The various methods of treatment have been discussed with the patient. After consideration of risks, benefits and other options for treatment, the patient has consented to  Procedure(s): ESOPHAGOGASTRODUODENOSCOPY (EGD) (N/A) as a surgical intervention.  The patient's history has been reviewed, patient examined, no change in status, stable for surgery.  I have reviewed the patient's chart and labs.  Questions were answered to the patient's satisfaction.     [name]

## 2020-05-08 ENCOUNTER — Inpatient Hospital Stay (HOSPITAL_COMMUNITY): Payer: Medicare Other

## 2020-05-08 ENCOUNTER — Encounter (HOSPITAL_COMMUNITY): Payer: Self-pay | Admitting: Gastroenterology

## 2020-05-08 DIAGNOSIS — K56609 Unspecified intestinal obstruction, unspecified as to partial versus complete obstruction: Secondary | ICD-10-CM | POA: Diagnosis not present

## 2020-05-08 DIAGNOSIS — K567 Ileus, unspecified: Secondary | ICD-10-CM | POA: Diagnosis not present

## 2020-05-08 LAB — CBC
HCT: 27.6 % — ABNORMAL LOW (ref 36.0–46.0)
Hemoglobin: 8.5 g/dL — ABNORMAL LOW (ref 12.0–15.0)
MCH: 32.1 pg (ref 26.0–34.0)
MCHC: 30.8 g/dL (ref 30.0–36.0)
MCV: 104.2 fL — ABNORMAL HIGH (ref 80.0–100.0)
Platelets: 181 10*3/uL (ref 150–400)
RBC: 2.65 MIL/uL — ABNORMAL LOW (ref 3.87–5.11)
RDW: 17.2 % — ABNORMAL HIGH (ref 11.5–15.5)
WBC: 6.1 10*3/uL (ref 4.0–10.5)
nRBC: 0.8 % — ABNORMAL HIGH (ref 0.0–0.2)

## 2020-05-08 LAB — BASIC METABOLIC PANEL
Anion gap: 10 (ref 5–15)
BUN: 28 mg/dL — ABNORMAL HIGH (ref 8–23)
CO2: 21 mmol/L — ABNORMAL LOW (ref 22–32)
Calcium: 7.8 mg/dL — ABNORMAL LOW (ref 8.9–10.3)
Chloride: 109 mmol/L (ref 98–111)
Creatinine, Ser: 2.24 mg/dL — ABNORMAL HIGH (ref 0.44–1.00)
GFR calc Af Amer: 26 mL/min — ABNORMAL LOW (ref >60)
GFR calc non Af Amer: 22 mL/min — ABNORMAL LOW (ref >60)
Glucose, Bld: 132 mg/dL — ABNORMAL HIGH (ref 70–99)
Potassium: 4.8 mmol/L (ref 3.5–5.1)
Sodium: 140 mmol/L (ref 135–145)

## 2020-05-08 LAB — GLUCOSE, CAPILLARY
Glucose-Capillary: 124 mg/dL — ABNORMAL HIGH (ref 70–99)
Glucose-Capillary: 170 mg/dL — ABNORMAL HIGH (ref 70–99)
Glucose-Capillary: 113 mg/dL — ABNORMAL HIGH (ref 70–99)
Glucose-Capillary: 117 mg/dL — ABNORMAL HIGH (ref 70–99)

## 2020-05-08 LAB — PROCALCITONIN: Procalcitonin: 0.32 ng/mL

## 2020-05-08 LAB — LACTIC ACID, PLASMA: Lactic Acid, Venous: 1 mmol/L (ref 0.5–1.9)

## 2020-05-08 MED ORDER — IOHEXOL 9 MG/ML PO SOLN
500.0000 mL | ORAL | Status: AC
  Administered 2020-05-08: 500 mL via ORAL

## 2020-05-08 MED ORDER — PANTOPRAZOLE SODIUM 40 MG PO TBEC
40.0000 mg | DELAYED_RELEASE_TABLET | Freq: Two times a day (BID) | ORAL | Status: DC
  Filled 2020-05-08: qty 1

## 2020-05-08 MED ORDER — PHENOL 1.4 % MT LIQD
1.0000 | OROMUCOSAL | Status: DC | PRN
  Filled 2020-05-08: qty 177

## 2020-05-08 MED ORDER — METOPROLOL TARTRATE 25 MG PO TABS
25.0000 mg | ORAL_TABLET | Freq: Two times a day (BID) | ORAL | Status: DC
  Administered 2020-05-08 – 2020-05-12 (×7): 25 mg via ORAL
  Filled 2020-05-08 (×8): qty 1

## 2020-05-08 MED ORDER — IOHEXOL 9 MG/ML PO SOLN
ORAL | Status: AC
  Filled 2020-05-08: qty 1000

## 2020-05-08 NOTE — Progress Notes (Signed)
PROGRESS NOTE    [name]  JTT:[id number] DOB: [birth year] DOA: 05/02/2020 PCP: [facility], [facility]   Brief Narrative:  Tina Ramos is a 64 y.o. female with medical history significant of liver cirrhosis, insulin-dependent type 2 diabetes, hyperlipidemia, gastroparesis, CKD stage IV presented to the ED after sustaining head injury from a fall.  Patient was recently evaluated in the ED on 8/9 after a fall and was diagnosed with a mildly displaced spiral fracture of the proximal diaphysis of the left humerus. she was not able to see orthopedics yet.   Complained of extremely swollen and painful left upper extremity associated with numbness and tingling in her fingers in the same extremity. she had multiple falls in the past 2 weeks.  Each time she describes it as just falling on the ground all of a sudden. Denied prodromal symptoms or any history of seizure. her 26 year old father takes care of her. She takes a baby aspirin daily.  Upon arrival to ED she was a stable with COVID-19 negative. Head CT showing acute bilateral subdural hematomas which extend along the tentorium, right greater than left.  Right posterior parietal scalp laceration and hematoma.  CT maxillofacial showing nondisplaced bilateral nasal bone fractures. CT C-spine negative for acute abnormality. X-ray of right shoulder negative for acute fracture or dislocation. Chest x-ray showing no acute intrathoracic process.    Extremity x-ray shows known displaced left humeral diaphyseal fracture.  ED provider discussed the case with Dr. Trenton Gammon from neurosurgery who felt that the subdural hematomas were small.  He recommended neurochecks and potential discharge from the ED. Admission requested as it was not safe for the patient to go home given frequency of falls.  Scalp laceration was repaired in the ED. Foley catheter was placed as patient does self-catheterization at home. Patient received fentanyl, IV fluid, and Tdap  booster.  Admitted under hospitalist service.  Orthopedics were consulted to see her on urgent basis for concern of possible compartment syndrome in the left upper extremity.  They ruled out compartment syndrome.  Subsequently she underwent  left humeral shaft intramedullary nail fixation 05/04/2020.  Repeat head CT showed stable subdural hematomas. she had one episode of vomiting which was thought to be black on late morning of 05/04/2020.  Patient denied having any history of GI bleed in the past but she has history of GERD for which she takes Protonix.  Hemoglobin was repeated which was stable.  For some reason, sample was not taken for occult blood.  Subsequently over the course of next 2 days, her hemoglobin dropped to 6.4 on 05/06/2020 and she received 1 unit of PRBC transfusion and GI was consulted and she underwent EGD on 05/07/2020 which was negative for any acute bleeding or any pathology   Assessment & Plan:   Principal Problem:   Subdural hematoma (HCC) Active Problems:   Humerus fracture   Scalp laceration   Nasal bone fractures   Falls frequently   Ileus (HCC)   SBO (small bowel obstruction) (HCC)   Acute bilateral subdural hematomas secondary to a fall: Patient takes aspirin 81 mg daily at home.  Head CT showing acute bilateral subdural hematomas which extend along the tentorium, right greater than left.  ED provider discussed the case with neurosurgery and and it was felt that the hematomas were small and neurosurgery in fact recommended discharging patient home however due to frequent falls, she was admitted under hospital service.  There is no consultation note from neurosurgery.  Repeat CT  head stable.   Displaced spiral fracture of the proximal diaphysis of the left humerus: Diagnosed after a fall on 8/9.  Imaging done this hospitalization showing increased displacement. Now status post left humeral shaft intramedullary nail fixation 05/04/2020.  Scalp laceration -Repaired in  the ED. Tdap booster was given.  Nondisplaced bilateral nasal bone fractures: Seen on CT. ENT follow-up as outpatient.  Frequent falls: Etiology unknown.  Likely due to deconditioning or vasovagal.  No event on telemetry noted so far.  Echo with normal ejection fraction and no wall motion abnormality and no valvular heart disease.  EEG negative for any epileptiform discharges.  Seen by PT OT and they recommend SNF.  Patient agreeable to go to SNF as well.  Acute on chronic anemia/hematemesis: Hemoglobin 7.7, MCV 103.8.  Hemoglobin was 10.4 on labs done in March 2021.  Patient is not endorsing any symptoms of GI bleed but she tells me that she bruises easily because it " a woman's skin".  Does have significant ecchymosis to her chest wall and left upper extremity from her recent fall which could possibly be contributing to the dropping hemoglobin to some degree.  Reportedly, she had one episode of vomiting which was black on late morning of 05/04/2020.  Patient denies having any history of GI bleed in the past but she has history of GERD for which she takes Protonix.  Subsequently her hemoglobin dropped to 6.4 on 05/06/2020.  She received 1 unit of PRBC transfusion.  GI was consulted.  She was started on octreotide and underwent EGD today which is unremarkable.  Hyperlipidemia: Holding atorvastatin for now.  Essential hypertension: Blood pressure Now controlled. -Continue hydralazine PRN SBP >170.   Sinus tachycardia/fever: Patient continues to have persistent sinus tachycardia for last 2 to 3 days and had a low-grade fever of 100.4 this morning.  No leukocytosis.  Procalcitonin only 0.32.  Normal lactic acid.  I have started her on Lopressor 25 mg p.o. twice daily.  Fever/ileus/small bowel obstruction: Patient has had couple of episodes of vomiting.  She claims that she is not passing gas since last 1 to 2 days.  Abdominal exam she has some distention but it is nontender.  Abdominal x-ray shows  possibility of ileus and may be small bowel obstruction.  We will keep her n.p.o. and increase her IV fluid frequency.  If frequent vomiting, will consider NG tube placement.  Insulin-dependent type 2 diabetes: Hemoglobin A1c 6.0.  Controlled blood sugar now.  Continue SSI.  Mood disorder: -Resume home medications  CKD stage IV: Renal function at baseline. -Resume home Renvela.  DVT prophylaxis: SCDs Start: 05/04/20 1640 SCDs Start: 05/03/20 0325   Code Status: Full Code  Family Communication: None present at bedside.  Plan of care discussed with patient in length and she verbalized understanding and agreed with it.  Status is: Inpatient  Remains inpatient appropriate because:Inpatient level of care appropriate due to severity of illness   Dispo: The patient is from: Home              Anticipated d/c is to: SNF              Anticipated d/c date is: 1 to 2 days              Patient currently is medically stable to d/c.     Estimated body mass index is 25.06 kg/m as calculated from the following:   Height as of 04/25/20: 5\' 2"  (1.575 m).   Weight as of  04/25/20: 62.1 kg.      Nutritional status:               Consultants:   Orthopedics  GI  Procedures:  left humeral shaft intramedullary nail fixation 05/04/2020. EGD 05/07/2020  Antimicrobials:  Anti-infectives (From admission, onward)   Start     Dose/Rate Route Frequency Ordered Stop   05/06/20 1800  rifaximin (XIFAXAN) tablet 550 mg        550 mg Oral 2 times daily 05/06/20 1538     05/04/20 1700  ceFAZolin (ANCEF) IVPB 1 g/50 mL premix        1 g 100 mL/hr over 30 Minutes Intravenous Every 6 hours 05/04/20 1639 05/05/20 0514   05/04/20 1349  vancomycin (VANCOCIN) powder  Status:  Discontinued          As needed 05/04/20 1349 05/04/20 1520   05/04/20 1314  ceFAZolin (ANCEF) 2-4 GM/100ML-% IVPB  Status:  Discontinued       Note to Pharmacy: Laurita Quint   : cabinet override      05/04/20 1314  05/04/20 1532   05/04/20 1000  ceFAZolin (ANCEF) IVPB 2g/100 mL premix        2 g 200 mL/hr over 30 Minutes Intravenous  Once 05/03/20 1316 05/04/20 0949         Subjective: Patient seen and examined.  She complained of vomiting and not passing gas.  No bowel movement in last couple of days.  Left upper extremity pain is stable.  Objective: Vitals:   05/07/20 2258 05/08/20 0000 05/08/20 0440 05/08/20 0800  BP: (!) 155/51 (!) 170/52 (!) 156/49 (!) 145/46  Pulse: (!) 109 (!) 112 (!) 111 (!) 107  Resp: (!) 25 19 (!) 25 20  Temp: 99 F (37.2 C) 99.3 F (37.4 C) 99.3 F (37.4 C) 98.4 F (36.9 C)  TempSrc: Oral Oral Oral Oral  SpO2: 98% 97% 95% 96%    Intake/Output Summary (Last 24 hours) at 05/08/2020 1322 Last data filed at 05/08/2020 0800 Gross per 24 hour  Intake 426.02 ml  Output 1500 ml  Net -1073.98 ml   There were no vitals filed for this visit.  Examination:  General exam: Appears calm and comfortable  Respiratory system: Clear to auscultation. Respiratory effort normal. Cardiovascular system: S1 & S2 heard, RRR. No JVD, murmurs, rubs, gallops or clicks. No pedal edema. Gastrointestinal system: Abdomen is distended, soft and nontender. No organomegaly or masses felt.  Diminished bowel sounds heard. Central nervous system: Alert and oriented. No focal neurological deficits. Extremities: Sling in the left upper extremity. Skin: Large bruise left upper extremity and left anterior chest Psychiatry: Judgement and insight appear normal. Mood & affect appropriate.   Data Reviewed: I have personally reviewed following labs and imaging studies  CBC: Recent Labs  Lab 05/04/20 2001 05/04/20 2001 05/05/20 0836 05/05/20 0836 05/05/20 1837 05/06/20 0601 05/06/20 1855 05/07/20 1649 05/08/20 0828  WBC 8.2   < > 11.5*   < > 11.5* 7.9 9.9 10.6* 6.1  NEUTROABS 6.9  --  8.4*  --  7.4 5.2 5.7  --   --   HGB 7.6*   < > 7.2*   < > 7.0* 6.4* 9.0* 9.7* 8.5*  HCT 25.4*   < >  23.3*   < > 22.9* 21.4* 27.3* 29.5* 27.6*  MCV 107.6*   < > 106.9*   < > 108.0* 109.2* 101.9* 100.0 104.2*  PLT 190   < > 170   < >  201 154 175 194 181   < > = values in this interval not displayed.   Basic Metabolic Panel: Recent Labs  Lab 05/04/20 0810 05/05/20 0836 05/06/20 0601 05/07/20 0514 05/08/20 0828  NA 139 141 135 136 140  K 5.1 4.7 4.8 5.3* 4.8  CL 104 109 108 105 109  CO2 21* 19* 18* 22 21*  GLUCOSE 122* 130* 133* 156* 132*  BUN 34* 28* 25* 28* 28*  CREATININE 2.66* 2.39* 2.32* 2.61* 2.24*  CALCIUM 8.2* 7.7* 7.5* 7.9* 7.8*  MG 2.1 2.0  --   --   --   PHOS 2.4*  --   --   --   --    GFR: Estimated Creatinine Clearance: 21.7 mL/min (A) (by C-G formula based on SCr of 2.24 mg/dL (H)). Liver Function Tests: Recent Labs  Lab 05/02/20 1908  AST 29  ALT 17  ALKPHOS 60  BILITOT 0.7  PROT 6.7  ALBUMIN 3.6   No results for input(s): LIPASE, AMYLASE in the last 168 hours. No results for input(s): AMMONIA in the last 168 hours. Coagulation Profile: Recent Labs  Lab 05/02/20 1928 05/07/20 0514  INR 1.1 1.7*   Cardiac Enzymes: No results for input(s): CKTOTAL, CKMB, CKMBINDEX, TROPONINI in the last 168 hours. BNP (last 3 results) No results for input(s): PROBNP in the last 8760 hours. HbA1C: No results for input(s): HGBA1C in the last 72 hours. CBG: Recent Labs  Lab 05/07/20 0753 05/07/20 1606 05/07/20 2153 05/08/20 0741 05/08/20 1244  GLUCAP 139* 168* 138* 124* 170*   Lipid Profile: No results for input(s): CHOL, HDL, LDLCALC, TRIG, CHOLHDL, LDLDIRECT in the last 72 hours. Thyroid Function Tests: No results for input(s): TSH, T4TOTAL, FREET4, T3FREE, THYROIDAB in the last 72 hours. Anemia Panel: No results for input(s): VITAMINB12, FOLATE, FERRITIN, TIBC, IRON, RETICCTPCT in the last 72 hours. Sepsis Labs: Recent Labs  Lab 05/08/20 0828  PROCALCITON 0.32  LATICACIDVEN 1.0    Recent Results (from the past 240 hour(s))  SARS Coronavirus 2 by  RT PCR (hospital order, performed in [facility] hospital lab) Nasopharyngeal Nasopharyngeal Swab     Status: None   Collection Time: 05/02/20  7:28 PM   Specimen: Nasopharyngeal Swab  Result Value Ref Range Status   SARS Coronavirus 2 NEGATIVE NEGATIVE Final    Comment: (NOTE) SARS-CoV-2 target nucleic acids are NOT DETECTED.  The SARS-CoV-2 RNA is generally detectable in upper and lower respiratory specimens during the acute phase of infection. The lowest concentration of SARS-CoV-2 viral copies this assay can detect is 250 copies / mL. A negative result does not preclude SARS-CoV-2 infection and should not be used as the sole basis for treatment or other patient management decisions.  A negative result may occur with improper specimen collection / handling, submission of specimen other than nasopharyngeal swab, presence of viral mutation(s) within the areas targeted by this assay, and inadequate number of viral copies (<250 copies / mL). A negative result must be combined with clinical observations, patient history, and epidemiological information.  Fact Sheet for Patients:   StrictlyIdeas.no  Fact Sheet for Healthcare Providers: BankingDealers.co.za  This test is not yet approved or  cleared by the [place] FDA and has been authorized for detection and/or diagnosis of SARS-CoV-2 by FDA under an Emergency Use Authorization (EUA).  This EUA will remain in effect (meaning this test can be used) for the duration of the COVID-19 declaration under Section 564(b)(1) of the Act, 21 U.S.C. section 360bbb-3(b)(1), unless the  authorization is terminated or revoked sooner.  Performed at [facility], [address] [place], [place] [address]   MRSA PCR Screening     Status: None   Collection Time: 05/02/20 11:40 PM   Specimen: Nasal Mucosa; Nasopharyngeal  Result Value Ref Range Status   MRSA by PCR NEGATIVE NEGATIVE  Final    Comment:        The GeneXpert MRSA Assay (FDA approved for NASAL specimens only), is one component of a comprehensive MRSA colonization surveillance program. It is not intended to diagnose MRSA infection nor to guide or monitor treatment for MRSA infections. Performed at [facility], [place] [address] [place], [place] [address]       Radiology Studies: DG Abd Portable 1V  Result Date: 05/08/2020 CLINICAL DATA:  Abdominal pain EXAM: PORTABLE ABDOMEN - 1 VIEW COMPARISON:  None. FINDINGS: Spinal stimulator overlies the right lower quadrant with lead coursing into the sacrum with tip not seen on this radiograph. Mildly dilated central abdominal small bowel loops up to 3.0 cm diameter. Mild gastric gaseous distention. Moderate colonic stool. No evidence of pneumatosis or pneumoperitoneum. No radiopaque nephrolithiasis. IMPRESSION: Mildly dilated central abdominal small bowel loops, cannot exclude ileus or mid to distal small bowel obstruction. Mild gastric gaseous distention. Moderate colonic stool. CT abdomen/pelvis with oral and IV contrast may be obtained for further evaluation as clinically warranted. Electronically Signed   By: Ilona Sorrel M.D.   On: 05/08/2020 11:52    Scheduled Meds:  sodium chloride   Intravenous Once   atorvastatin  20 mg Oral Daily   Chlorhexidine Gluconate Cloth  6 each Topical Daily   docusate sodium  100 mg Oral BID   gabapentin  100 mg Oral TID   insulin aspart  0-9 Units Subcutaneous TID WC   lactulose  20 g Oral BID   metoprolol tartrate  25 mg Oral BID   pantoprazole  40 mg Oral BID   QUEtiapine  300 mg Oral QHS   rifaximin  550 mg Oral BID   sevelamer carbonate  1,600 mg Oral TID WC   torsemide  20 mg Oral Once per day on Mon Wed Fri   venlafaxine XR  75 mg Oral Q breakfast   Continuous Infusions:  sodium chloride 75 mL/hr at 05/08/20 0419     LOS: 5 days   Time spent: 30 minutes   Darliss Cheney, MD [facility]  05/08/2020, 1:22 PM   To contact the attending provider between 7A-7P or the covering provider during after hours 7P-7A, please log into the web site [email or web address].

## 2020-05-08 NOTE — Progress Notes (Signed)
Occupational Therapy Treatment Patient Details Name: Tina Ramos MRN: [id number] DOB: 12/22/1955 Today's Date: 05/08/2020    History of present illness This is a 64 year old female with a history of cirrhosis, diabetes, hyperlipidemia who presents with left arm pain.  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 D/C'd home. Back to ED on 8/16 suffering a fall while she was in her kitchen.  She states she was at the refrigerator and started to fall hitting the drywall with her head and right shoulder then sliding down onto her butt.Reports 4-5 falls per day since 8/9. VX:YIAXK acute bilateral subdural hematomas which extend along the tentorium, right greater than left.  She has nondisplaced bilateral nasal bone fractures which will likely not require any ENT intervention.Imaging of left UE fracture showing increased displacement.  There is concern for possible compartment syndrome since the left upper extremity is extremely swollen and patient with complaints of severe pain and paresthesias.  Pt underwent IM nail to L humerous on 8/18.   OT comments  Pt. Seen for skilled OT treatment session. Focus of session ROM to LUE with focus on digit/wrist/elbow ROM.  Pt. Tolerated well.    Follow Up Recommendations  SNF    Equipment Recommendations  None recommended by OT    Recommendations for Other Services      Precautions / Restrictions Precautions Precautions: Fall;Shoulder Type of Shoulder Precautions: AROM to L fingers, wrist and elbow only.  No active ROM or movement to the L shoulder for next two weeks.  Shoulder Interventions: Shoulder sling/immobilizer;At all times;Off for dressing/bathing/exercises Required Braces or Orthoses: Sling       Mobility Bed Mobility                  Transfers                      Balance                                            ADL either performed or assessed with clinical judgement   ADL                                               Vision       Perception     Praxis      Cognition Arousal/Alertness: Lethargic Behavior During Therapy: Flat affect Overall Cognitive Status: Within Functional Limits for tasks assessed                                          Exercises General Exercises - Upper Extremity Elbow Extension: AAROM;AROM;Left;20 reps;Supine Wrist Flexion: AROM;Left;10 reps;Supine Wrist Extension: AROM;Left;10 reps;Supine Digit Composite Flexion: AROM;Left;10 reps;Supine Composite Extension: AROM;Left;10 reps;Supine   Shoulder Instructions       General Comments      Pertinent Vitals/ Pain       Pain Assessment: Faces Faces Pain Scale: Hurts a little bit Pain Location: LUE Pain Descriptors / Indicators: Grimacing;Guarding Pain Intervention(s): Limited activity within patient's tolerance;Monitored during session  Home Living  Prior Functioning/Environment              Frequency  Min 2X/week        Progress Toward Goals  OT Goals(current goals can now be found in the care plan section)  Progress towards OT goals: Progressing toward goals     Plan Discharge plan remains appropriate    Co-evaluation                 AM-PAC OT "6 Clicks" Daily Activity     Outcome Measure   Help from another person eating meals?: A Little Help from another person taking care of personal grooming?: A Little Help from another person toileting, which includes using toliet, bedpan, or urinal?: A Lot Help from another person bathing (including washing, rinsing, drying)?: A Lot Help from another person to put on and taking off regular upper body clothing?: A Lot Help from another person to put on and taking off regular lower body clothing?: A Lot 6 Click Score:  14    End of Session    OT Visit Diagnosis: Unsteadiness on feet (R26.81);Other abnormalities of gait and mobility (R26.89);Repeated falls (R29.6);History of falling (Z91.81);Pain Pain - Right/Left: Left Pain - part of body: Shoulder;Arm   Activity Tolerance Patient tolerated treatment well   Patient Left in bed;with call bell/phone within reach   Nurse Communication Other (comment) (spoke with rn regarding plans for OT treatment session, she states okay to work with pt. today)        Time: 8921-1941 OT Time Calculation (min): 10 min  Charges: OT General Charges $OT Visit: 1 Visit OT Treatments $Therapeutic Exercise: 8-22 mins  Sonia Baller, COTA/L Acute Rehabilitation [phone]   Janice Coffin 05/08/2020, 9:48 AM

## 2020-05-08 NOTE — Progress Notes (Signed)
   ORTHOPAEDIC PROGRESS NOTE  s/p Procedure(s): LEFT INTRAMEDULLARY (IM) NAIL HUMERAL on 8/18/20021 with Dr. Griffin Basil  SUBJECTIVE: No complaints this morning.   OBJECTIVE: PE:  General: resting in hospital bed, alert & oriented, no acute distress Left upper extremity: Dressing CDI - minimal strike-through noted. Continued swelling and bruising of left arm - seems to be improving. Compartments compressible. She endorses axillary nerve sensation. + Motor in  AIN, PIN, Ulnar distributions.  Sensation intact in medial, radial, and ulnar distributions. Well perfused digits.   Vitals:   05/08/20 0000 05/08/20 0440  BP: (!) 170/52 (!) 156/49  Pulse: (!) 112 (!) 111  Resp: 19 (!) 25  Temp: 99.3 F (37.4 C) 99.3 F (37.4 C)  SpO2: 97% 95%    ASSESSMENT: Tina Ramos is a 64 y.o. female status post IMN left humerus. POD#4  PLAN: Weightbearing: NWB LUE   - Pendulums start week 2   - Active assisted ROM starting week 3 Insicional and dressing care: Reinforce dressings as needed Orthopedic device(s): Sling for 6 weeks Showering: Post-op day #2 with assistance VTE prophylaxis: SCDs. Ambulation. Due to patient's history of bilateral subdural hematomas and CKD stage IV we will defer to medicine for further DVT prophylaxis. No orthopedic contraindications.  Pain control: PRN pain medications. Preferring oral medications Follow - up plan: 1 week in office for incision check and xray Contact information: After hours and holidays please check [email or web address] for group call information for [facility]  Dispo: TBD. PT/OT recommending SNF. Currently not medically stable. Patient agreeable to SNF. Printed and placed prescription for pain medications for discharge.    Noemi Chapel, PA-C 05/08/2020

## 2020-05-08 NOTE — Progress Notes (Signed)
Vomited last night.  No evident bleeding.  Not requiring much pain medication, so reason for impaired gastric emptying is not clear.  She is eating small amounts of her full liquid diet, mostly the desserts such as pudding.  Hemoglobin has declined from 9.7 to 8.5 over the past 24 hours, but when one considers that her hemoglobin was 6.4 at time of transfusion on August 20, the current level of 8.5 reflects a greater-than-expected rise in hemoglobin, making me think that the decline from yesterday is due to equilibration rather than ongoing blood loss.  Moreover, her BUN has remained stable at 28.  On exam, the patient is lying in bed in no distress.  She appears comfortable.  Alert, pleasant, slight speech impediment.  No succussion splash on abdominal exam.  Impression:  1.  Coffee-ground emesis and significant drop in hemoglobin without evidence of further bleeding for several days.  Question whether some of the blood loss could be related to her arm fracture  2.  Limited endoscopy showing large gastric residual, no obvious cause for bleeding  3.  Subdural hematoma and fractured humerus, frequent falls  4.  Poor oral intake, impaired gastric emptying  Plan:  1.  Add Glucerna supplements to full liquid diet  2.  Consider calorie counts if the patient's oral intake does not improve in the next several days  3. CT of abdomen and pelvis (without IV contrast, in view of azotemia) to check for any cause of the patient's delayed gastric emptying and poor oral intake, such as downstream obstruction, or tumor  Cleotis Nipper, M.D. Pager [phone] If no answer or after 5 PM call [phone]

## 2020-05-09 LAB — GLUCOSE, CAPILLARY
Glucose-Capillary: 109 mg/dL — ABNORMAL HIGH (ref 70–99)
Glucose-Capillary: 114 mg/dL — ABNORMAL HIGH (ref 70–99)
Glucose-Capillary: 122 mg/dL — ABNORMAL HIGH (ref 70–99)
Glucose-Capillary: 133 mg/dL — ABNORMAL HIGH (ref 70–99)
Glucose-Capillary: 134 mg/dL — ABNORMAL HIGH (ref 70–99)

## 2020-05-09 LAB — CBC WITH DIFFERENTIAL/PLATELET
Abs Immature Granulocytes: 0.06 10*3/uL (ref 0.00–0.07)
Basophils Absolute: 0 10*3/uL (ref 0.0–0.1)
Basophils Relative: 0 %
Eosinophils Absolute: 0.4 10*3/uL (ref 0.0–0.5)
Eosinophils Relative: 6 %
HCT: 26.1 % — ABNORMAL LOW (ref 36.0–46.0)
Hemoglobin: 7.8 g/dL — ABNORMAL LOW (ref 12.0–15.0)
Immature Granulocytes: 1 %
Lymphocytes Relative: 17 %
Lymphs Abs: 1.1 10*3/uL (ref 0.7–4.0)
MCH: 31.7 pg (ref 26.0–34.0)
MCHC: 29.9 g/dL — ABNORMAL LOW (ref 30.0–36.0)
MCV: 106.1 fL — ABNORMAL HIGH (ref 80.0–100.0)
Monocytes Absolute: 0.8 10*3/uL (ref 0.1–1.0)
Monocytes Relative: 13 %
Neutro Abs: 4.1 10*3/uL (ref 1.7–7.7)
Neutrophils Relative %: 63 %
Platelets: 183 10*3/uL (ref 150–400)
RBC: 2.46 MIL/uL — ABNORMAL LOW (ref 3.87–5.11)
RDW: 16.8 % — ABNORMAL HIGH (ref 11.5–15.5)
WBC: 6.5 10*3/uL (ref 4.0–10.5)
nRBC: 0.5 % — ABNORMAL HIGH (ref 0.0–0.2)

## 2020-05-09 LAB — BASIC METABOLIC PANEL
Anion gap: 10 (ref 5–15)
BUN: 30 mg/dL — ABNORMAL HIGH (ref 8–23)
CO2: 20 mmol/L — ABNORMAL LOW (ref 22–32)
Calcium: 7.7 mg/dL — ABNORMAL LOW (ref 8.9–10.3)
Chloride: 110 mmol/L (ref 98–111)
Creatinine, Ser: 2.07 mg/dL — ABNORMAL HIGH (ref 0.44–1.00)
GFR calc Af Amer: 28 mL/min — ABNORMAL LOW (ref >60)
GFR calc non Af Amer: 25 mL/min — ABNORMAL LOW (ref >60)
Glucose, Bld: 118 mg/dL — ABNORMAL HIGH (ref 70–99)
Potassium: 5.4 mmol/L — ABNORMAL HIGH (ref 3.5–5.1)
Sodium: 140 mmol/L (ref 135–145)

## 2020-05-09 LAB — MAGNESIUM: Magnesium: 2.1 mg/dL (ref 1.7–2.4)

## 2020-05-09 MED ORDER — ACETAMINOPHEN 10 MG/ML IV SOLN
1000.0000 mg | Freq: Once | INTRAVENOUS | Status: AC
  Administered 2020-05-09: 1000 mg via INTRAVENOUS
  Filled 2020-05-09 (×2): qty 100

## 2020-05-09 MED ORDER — DEXTROSE 50 % IV SOLN
1.0000 | Freq: Once | INTRAVENOUS | Status: AC
  Administered 2020-05-09: 50 mL via INTRAVENOUS
  Filled 2020-05-09: qty 50

## 2020-05-09 MED ORDER — PANTOPRAZOLE SODIUM 40 MG IV SOLR
40.0000 mg | Freq: Two times a day (BID) | INTRAVENOUS | Status: DC
  Administered 2020-05-09 – 2020-05-12 (×7): 40 mg via INTRAVENOUS
  Filled 2020-05-09 (×7): qty 40

## 2020-05-09 MED ORDER — BISACODYL 10 MG RE SUPP
10.0000 mg | Freq: Once | RECTAL | Status: AC
  Administered 2020-05-09: 10 mg via RECTAL
  Filled 2020-05-09: qty 1

## 2020-05-09 MED ORDER — SORBITOL 70 % SOLN
960.0000 mL | TOPICAL_OIL | Freq: Once | ORAL | Status: AC
  Administered 2020-05-09: 960 mL via RECTAL
  Filled 2020-05-09: qty 473

## 2020-05-09 MED ORDER — INSULIN ASPART 100 UNIT/ML IV SOLN
10.0000 [IU] | Freq: Once | INTRAVENOUS | Status: AC
  Administered 2020-05-09: 10 [IU] via INTRAVENOUS

## 2020-05-09 NOTE — Progress Notes (Addendum)
[facility] Progress Note  Tina Ramos 64 y.o. 13-Feb-1956  CC: Coffee-ground emesis (resolved), ileus versus SBO  Subjective: Patient denies any further episodes of vomiting.  Still reports mild nausea.  States she has not had a bowel movement since admission.    Per flowsheet review, one small type I brown bowel movement yesterday, otherwise no other bowel movements documented since 05/03/2020.  ROS : Review of Systems  Respiratory: Positive for shortness of breath. Negative for cough.   Gastrointestinal: Positive for constipation and nausea. Negative for abdominal pain, blood in stool, diarrhea, heartburn, melena and vomiting.   Objective: Vital signs in last 24 hours: Vitals:   05/08/20 2345 05/09/20 0742  BP: (!) 112/42 (!) 126/42  Pulse: 87 98  Resp: 18 13  Temp: (!) 97.5 F (36.4 C) 98.4 F (36.9 C)  SpO2: 96% 97%    Physical Exam:  General:  Lethargic, oriented, cooperative, no acute distress  Head:  Normocephalic, without obvious abnormality, atraumatic  Eyes:  Conjunctival pallor, EOMs intact  Lungs:   Clear to auscultation bilaterally, respirations unlabored  Heart:  Regular rate and rhythm, S1, S2 normal  Abdomen:   Soft, mildly distended, mild diffuse tenderness, hypoactive bowel sounds, no guarding or peritoneal signs   Extremities: Extremities normal, atraumatic, no  edema  Pulses: 2+ and symmetric    Lab Results: Recent Labs    05/08/20 0828 05/09/20 0653  NA 140 140  K 4.8 5.4*  CL 109 110  CO2 21* 20*  GLUCOSE 132* 118*  BUN 28* 30*  CREATININE 2.24* 2.07*  CALCIUM 7.8* 7.7*   No results for input(s): AST, ALT, ALKPHOS, BILITOT, PROT, ALBUMIN in the last 72 hours. Recent Labs    05/06/20 1855 05/07/20 1649 05/08/20 0828 05/09/20 0653  WBC 9.9   < > 6.1 6.5  NEUTROABS 5.7  --   --  4.1  HGB 9.0*   < > 8.5* 7.8*  HCT 27.3*   < > 27.6* 26.1*  MCV 101.9*   < > 104.2* 106.1*  PLT 175   < > 181 183   < > = values in this  interval not displayed.   Recent Labs    05/07/20 0514  LABPROT 19.5*  INR 1.7*    Assessment: Ileus versus SBO: CT 05/08/2020 showed Developing ileus versus partial small bowel obstruction in the setting of marked constipation. Dilated small bowel loops are however not distal small bowel loops but are proximal to mid small bowel loops.  Suspect ileus. -Potassium 5.4 -Magnesium 8/19: 2.0; will repeat Mg today  Hematemesis, resolved -Hemoglobin downtrending, now 7.8 as compared to 8.5 yesterday.  Possible that decrease in Hgb is not from GI blood loss (recent subdural hematomas, recent orthopedic surgery).  No further signs of GI bleeding.  Recent subdural hematomas and humeral fracture s/p repair (secondary to falls)  Cirrhosis per CT and US imaging: compensated -Platelets 183 K/uL -INR 1.7 as of 8/21 -LFTs normal as of 05/02/20  History of achalasia s/p POEM (08/2018)  DM type 2 with history of gastroparesis  CKD: BUN 30/ Cr 2.07   Plan: Continue NPO  status for now.  Will hold off on NGT placement for now due to no active vomiting.  Continue Protonix BID. Will switch to IV for now due to ileus/NPO status.  Continue maintain potassium 4.5-5.0.  Continue to maintain magnesium ~2.0.  Continue to monitor H&H with transfusion as needed to maintain Hgb >7.  [facility] will follow.  Salley Slaughter  PA-C 05/09/2020, 8:48 AM  Contact #  [phone]

## 2020-05-09 NOTE — Progress Notes (Signed)
PROGRESS NOTE    Tina Ramos  WGY:[id number] DOB: [birth year] DOA: 05/02/2020 PCP: [facility]   Brief Narrative:  Tina Ramos is a 64 y.o. female with medical history significant of liver cirrhosis, insulin-dependent type 2 diabetes, hyperlipidemia, gastroparesis, CKD stage IV presented to the ED after sustaining head injury from a fall.  Patient was recently evaluated in the ED on 8/9 after a fall and was diagnosed with a mildly displaced spiral fracture of the proximal diaphysis of the left humerus. she was not able to see orthopedics yet.   Complained of extremely swollen and painful left upper extremity associated with numbness and tingling in her fingers in the same extremity. she had multiple falls in the past 2 weeks.  Each time she describes it as just falling on the ground all of a sudden. Denied prodromal symptoms or any history of seizure. her 109 year old father takes care of her. She takes a baby aspirin daily.  Upon arrival to ED she was a stable with COVID-19 negative. Head CT showing acute bilateral subdural hematomas which extend along the tentorium, right greater than left.  Right posterior parietal scalp laceration and hematoma.  CT maxillofacial showing nondisplaced bilateral nasal bone fractures. CT C-spine negative for acute abnormality. X-ray of right shoulder negative for acute fracture or dislocation. Chest x-ray showing no acute intrathoracic process.    Extremity x-ray shows known displaced left humeral diaphyseal fracture.  ED provider discussed the case with Dr. Trenton Gammon from neurosurgery who felt that the subdural hematomas were small.  He recommended neurochecks and potential discharge from the ED. Admission requested as it was not safe for the patient to go home given frequency of falls.  Scalp laceration was repaired in the ED. Foley catheter was placed as patient does self-catheterization at home. Patient received fentanyl, IV fluid, and Tdap  booster.  Admitted under hospitalist service.  Orthopedics were consulted to see her on urgent basis for concern of possible compartment syndrome in the left upper extremity.  They ruled out compartment syndrome.  Subsequently she underwent  left humeral shaft intramedullary nail fixation 05/04/2020.  Repeat head CT showed stable subdural hematomas. she had one episode of vomiting which was thought to be black on late morning of 05/04/2020.  Patient denied having any history of GI bleed in the past but she has history of GERD for which she takes Protonix.  Hemoglobin was repeated which was stable.  For some reason, sample was not taken for occult blood.  Subsequently over the course of next 2 days, her hemoglobin dropped to 6.4 on 05/06/2020 and she received 1 unit of PRBC transfusion and GI was consulted and she underwent EGD on 05/07/2020 which was negative for any acute bleeding or any pathology   Assessment & Plan:   Principal Problem:   Subdural hematoma (HCC) Active Problems:   Humerus fracture   Scalp laceration   Nasal bone fractures   Falls frequently   Ileus (HCC)   SBO (small bowel obstruction) (HCC)   Acute bilateral subdural hematomas secondary to a fall: Patient takes aspirin 81 mg daily at home.  Head CT showing acute bilateral subdural hematomas which extend along the tentorium, right greater than left.  ED provider discussed the case with neurosurgery and and it was felt that the hematomas were small and neurosurgery in fact recommended discharging patient home however due to frequent falls, she was admitted under hospital service.  There is no consultation note from neurosurgery.  Repeat CT  head stable.   Displaced spiral fracture of the proximal diaphysis of the left humerus: Diagnosed after a fall on 8/9.  Imaging done this hospitalization showing increased displacement. Now status post left humeral shaft intramedullary nail fixation 05/04/2020.  Scalp laceration -Repaired in  the ED. Tdap booster was given.  Nondisplaced bilateral nasal bone fractures: Seen on CT. ENT follow-up as outpatient.  Frequent falls: Etiology unknown.  Likely due to deconditioning or vasovagal.  No event on telemetry noted so far.  Echo with normal ejection fraction and no wall motion abnormality and no valvular heart disease.  EEG negative for any epileptiform discharges.  Seen by PT OT and they recommend SNF.  Patient agreeable to go to SNF as well.  Acute on chronic anemia/hematemesis: Hemoglobin 7.7, MCV 103.8.  Hemoglobin was 10.4 on labs done in March 2021.  Patient is not endorsing any symptoms of GI bleed but she tells me that she bruises easily because it " a woman's skin".  Does have significant ecchymosis to her chest wall and left upper extremity from her recent fall which could possibly be contributing to the dropping hemoglobin to some degree.  Reportedly, she had one episode of vomiting which was black on late morning of 05/04/2020.  Patient denies having any history of GI bleed in the past but she has history of GERD for which she takes Protonix.  Subsequently her hemoglobin dropped to 6.4 on 05/06/2020.  She received 1 unit of PRBC transfusion.  GI was consulted.  She was started on octreotide and underwent EGD twhich is unremarkable.  Hemoglobin with slight fall but no indication of transfusion.  Hyperlipidemia: Holding atorvastatin for now.  Essential hypertension: Blood pressure Now controlled. -Continue hydralazine PRN SBP >170.   Sinus tachycardia/fever: Patient continues to have persistent sinus tachycardia for last 2 to 3 days and had a low-grade fever of 100.4 this morning.  No leukocytosis.  Procalcitonin only 0.32.  Normal lactic acid.  I have started her on Lopressor 25 mg p.o. twice daily.  Fever/ileus/small bowel obstruction: Afebrile for more than 24 hours now.  No more nausea or vomiting.  X-ray and CT abdomen indicates ileus/small bowel obstruction.  Started  n.p.o. on the morning of 05/08/2020.  Has constipation/heavy load of stool burden on the CT scan as well but now she is passing gas.  I have ordered 1 dose of Dulcolax suppository.  No indication for NG tube as there is no nausea or vomiting.  Encouraged mobilization.  Insulin-dependent type 2 diabetes: Hemoglobin A1c 6.0.  Controlled blood sugar now.  Continue SSI.  Mood disorder: -Resume home medications  CKD stage IV: Renal function at baseline. -Resume home Renvela.  Hyperkalemia: 5.4. Will use D50 along with 10 units of NovoLog IV insulin. Recheck in the morning.  DVT prophylaxis: SCDs Start: 05/04/20 1640 SCDs Start: 05/03/20 0325   Code Status: Full Code  Family Communication: None present at bedside.  Plan of care discussed with patient in length and she verbalized understanding and agreed with it.  Status is: Inpatient  Remains inpatient appropriate because:Inpatient level of care appropriate due to severity of illness   Dispo: The patient is from: Home              Anticipated d/c is to: SNF              Anticipated d/c date is: 1 to 2 days              Patient currently is not medically  stable to d/c.     Estimated body mass index is 25.06 kg/m as calculated from the following:   Height as of 04/25/20: 5\' 2"  (1.575 m).   Weight as of 04/25/20: 62.1 kg.      Nutritional status:               Consultants:   Orthopedics  GI  Procedures:  left humeral shaft intramedullary nail fixation 05/04/2020. EGD 05/07/2020  Antimicrobials:  Anti-infectives (From admission, onward)   Start     Dose/Rate Route Frequency Ordered Stop   05/06/20 1800  rifaximin (XIFAXAN) tablet 550 mg        550 mg Oral 2 times daily 05/06/20 1538     05/04/20 1700  ceFAZolin (ANCEF) IVPB 1 g/50 mL premix        1 g 100 mL/hr over 30 Minutes Intravenous Every 6 hours 05/04/20 1639 05/05/20 0514   05/04/20 1349  vancomycin (VANCOCIN) powder  Status:  Discontinued          As  needed 05/04/20 1349 05/04/20 1520   05/04/20 1314  ceFAZolin (ANCEF) 2-4 GM/100ML-% IVPB  Status:  Discontinued       Note to Pharmacy: Laurita Quint   : cabinet override      05/04/20 1314 05/04/20 1532   05/04/20 1000  ceFAZolin (ANCEF) IVPB 2g/100 mL premix        2 g 200 mL/hr over 30 Minutes Intravenous  Once 05/03/20 1316 05/04/20 0949         Subjective: Patient seen and examined. As always, she has no complaints and she is pleasant. Denied any nausea today. Now she states that she is passing flatus.  Objective: Vitals:   05/08/20 0800 05/08/20 1930 05/08/20 2345 05/09/20 0742  BP: (!) 145/46 (!) 118/41 (!) 112/42 (!) 126/42  Pulse: (!) 107 100 87 98  Resp: 20 17 18 13   Temp: 98.4 F (36.9 C) 98.6 F (37 C) (!) 97.5 F (36.4 C) 98.4 F (36.9 C)  TempSrc: Oral Oral Oral Oral  SpO2: 96% 99% 96% 97%    Intake/Output Summary (Last 24 hours) at 05/09/2020 1105 Last data filed at 05/09/2020 0745 Gross per 24 hour  Intake --  Output 1000 ml  Net -1000 ml   There were no vitals filed for this visit.  Examination:  General exam: Appears calm and comfortable  Respiratory system: Clear to auscultation. Respiratory effort normal. Cardiovascular system: S1 & S2 heard, RRR. No JVD, murmurs, rubs, gallops or clicks. No pedal edema. Gastrointestinal system: Abdomen is nondistended, soft and nontender. No organomegaly or masses felt. Sluggish bowel sounds Central nervous system: Alert and oriented. No focal neurological deficits. Extremities: Symmetric 5 x 5 power. Skin: No rashes, lesions or ulcers.  Psychiatry: Judgement and insight appear normal. Mood & affect appropriate.   Data Reviewed: I have personally reviewed following labs and imaging studies  CBC: Recent Labs  Lab 05/05/20 0836 05/05/20 0836 05/05/20 1837 05/05/20 1837 05/06/20 0601 05/06/20 1855 05/07/20 1649 05/08/20 0828 05/09/20 0653  WBC 11.5*   < > 11.5*   < > 7.9 9.9 10.6* 6.1 6.5  NEUTROABS  8.4*  --  7.4  --  5.2 5.7  --   --  4.1  HGB 7.2*   < > 7.0*   < > 6.4* 9.0* 9.7* 8.5* 7.8*  HCT 23.3*   < > 22.9*   < > 21.4* 27.3* 29.5* 27.6* 26.1*  MCV 106.9*   < > 108.0*   < >  109.2* 101.9* 100.0 104.2* 106.1*  PLT 170   < > 201   < > 154 175 194 181 183   < > = values in this interval not displayed.   Basic Metabolic Panel: Recent Labs  Lab 05/04/20 0810 05/04/20 0810 05/05/20 0836 05/06/20 0601 05/07/20 0514 05/08/20 0828 05/09/20 0653 05/09/20 1019  NA 139   < > 141 135 136 140 140  --   K 5.1   < > 4.7 4.8 5.3* 4.8 5.4*  --   CL 104   < > 109 108 105 109 110  --   CO2 21*   < > 19* 18* 22 21* 20*  --   GLUCOSE 122*   < > 130* 133* 156* 132* 118*  --   BUN 34*   < > 28* 25* 28* 28* 30*  --   CREATININE 2.66*   < > 2.39* 2.32* 2.61* 2.24* 2.07*  --   CALCIUM 8.2*   < > 7.7* 7.5* 7.9* 7.8* 7.7*  --   MG 2.1  --  2.0  --   --   --   --  2.1  PHOS 2.4*  --   --   --   --   --   --   --    < > = values in this interval not displayed.   GFR: Estimated Creatinine Clearance: 23.5 mL/min (A) (by C-G formula based on SCr of 2.07 mg/dL (H)). Liver Function Tests: Recent Labs  Lab 05/02/20 1908  AST 29  ALT 17  ALKPHOS 60  BILITOT 0.7  PROT 6.7  ALBUMIN 3.6   No results for input(s): LIPASE, AMYLASE in the last 168 hours. No results for input(s): AMMONIA in the last 168 hours. Coagulation Profile: Recent Labs  Lab 05/02/20 1928 05/07/20 0514  INR 1.1 1.7*   Cardiac Enzymes: No results for input(s): CKTOTAL, CKMB, CKMBINDEX, TROPONINI in the last 168 hours. BNP (last 3 results) No results for input(s): PROBNP in the last 8760 hours. HbA1C: No results for input(s): HGBA1C in the last 72 hours. CBG: Recent Labs  Lab 05/08/20 0741 05/08/20 1244 05/08/20 1645 05/08/20 2134 05/09/20 0744  GLUCAP 124* 170* 113* 117* 109*   Lipid Profile: No results for input(s): CHOL, HDL, LDLCALC, TRIG, CHOLHDL, LDLDIRECT in the last 72 hours. Thyroid Function Tests: No  results for input(s): TSH, T4TOTAL, FREET4, T3FREE, THYROIDAB in the last 72 hours. Anemia Panel: No results for input(s): VITAMINB12, FOLATE, FERRITIN, TIBC, IRON, RETICCTPCT in the last 72 hours. Sepsis Labs: Recent Labs  Lab 05/08/20 0828  PROCALCITON 0.32  LATICACIDVEN 1.0    Recent Results (from the past 240 hour(s))  SARS Coronavirus 2 by RT PCR (hospital order, performed in [facility] hospital lab) Nasopharyngeal Nasopharyngeal Swab     Status: None   Collection Time: 05/02/20  7:28 PM   Specimen: Nasopharyngeal Swab  Result Value Ref Range Status   SARS Coronavirus 2 NEGATIVE NEGATIVE Final    Comment: (NOTE) SARS-CoV-2 target nucleic acids are NOT DETECTED.  The SARS-CoV-2 RNA is generally detectable in upper and lower respiratory specimens during the acute phase of infection. The lowest concentration of SARS-CoV-2 viral copies this assay can detect is 250 copies / mL. A negative result does not preclude SARS-CoV-2 infection and should not be used as the sole basis for treatment or other patient management decisions.  A negative result may occur with improper specimen collection / handling, submission of specimen other than nasopharyngeal swab, presence  of viral mutation(s) within the areas targeted by this assay, and inadequate number of viral copies (<250 copies / mL). A negative result must be combined with clinical observations, patient history, and epidemiological information.  Fact Sheet for Patients:   StrictlyIdeas.no  Fact Sheet for Healthcare Providers: BankingDealers.co.za  This test is not yet approved or  cleared by the [place] FDA and has been authorized for detection and/or diagnosis of SARS-CoV-2 by FDA under an Emergency Use Authorization (EUA).  This EUA will remain in effect (meaning this test can be used) for the duration of the COVID-19 declaration under Section 564(b)(1) of the Act, 21  U.S.C. section 360bbb-3(b)(1), unless the authorization is terminated or revoked sooner.  Performed at [facility], [address] [place], [place] [address]   MRSA PCR Screening     Status: None   Collection Time: 05/02/20 11:40 PM   Specimen: Nasal Mucosa; Nasopharyngeal  Result Value Ref Range Status   MRSA by PCR NEGATIVE NEGATIVE Final    Comment:        The GeneXpert MRSA Assay (FDA approved for NASAL specimens only), is one component of a comprehensive MRSA colonization surveillance program. It is not intended to diagnose MRSA infection nor to guide or monitor treatment for MRSA infections. Performed at [facility], [place] [address], [place], [place] [address]       Radiology Studies: CT ABDOMEN PELVIS WO CONTRAST  Result Date: 05/08/2020 CLINICAL DATA:  Central abdominal pain. EXAM: CT ABDOMEN AND PELVIS WITHOUT CONTRAST TECHNIQUE: Multidetector CT imaging of the abdomen and pelvis was performed following the standard protocol without IV contrast. COMPARISON:  August 19, 2019 FINDINGS: Lower chest: Moderate RIGHT small LEFT pleural effusion. Basilar atelectasis. Patulous distal esophagus is filled with ingested contrast. No pericardial effusion. Heart is incompletely imaged. Hepatobiliary: Evidence of hepatic cirrhosis. Cholelithiasis and or sludge in the gallbladder lumen without pericholecystic stranding. Pancreas: Pancreas is atrophic without ductal dilation or inflammation. Spleen: Spleen mildly enlarged. Adrenals/Urinary Tract: Adrenal glands are normal. Bilateral perinephric stranding in the setting of mild generalized edema. Renal cortical scarring. No hydronephrosis. No nephrolithiasis. Urinary bladder with small amount of gas in the anti dependent portion. Bladder is under distended. Clear fat plane between the urinary bladder in the adjacent bowel. No signs of bladder wall thickening to indicate fistula. Stomach/Bowel: Marked stool in the colon  throughout the colon well-formed stool as it transitions more distally, partially liquid stool seen proximally. No pericolonic stranding. Small bowel partially filled with ingested contrast material. Moderately dilated in the mid small bowel up to approximately 3 cm dilated loops are centralized with transition, gradual transition at the distal jejunum. Some dilute contrast distal to the site of transition. No perienteric stranding. Transition occurring in the LEFT mid abdomen on images 43-50. Vascular/Lymphatic: Scattered calcified atheromatous plaque of the abdominal aorta. No aneurysmal dilation. No adenopathy. Reproductive: Post hysterectomy.  No adnexal mass. Other: No ascites.  No free air. Musculoskeletal: Sacral nerve stimulator in place passing through the S2 neural foramen on the LEFT. Power pack over RIGHT gluteal region. No acute musculoskeletal process. Spinalw degenerative changes. IMPRESSION: 1. Developing ileus versus partial small bowel obstruction in the setting of marked constipation. Dilated small bowel loops are however not distal small bowel loops but are proximal to mid small bowel loops. 2. Moderate RIGHT small LEFT pleural effusion. 3. Patulous esophagus may indicate dysmotility are marked reflux. 4. Bilateral perinephric stranding, nonspecific in the setting of developing anasarca. Correlate with  urine studies to exclude the possibility of urinary tract infection. 5. Cirrhosis and portal hypertension as before. 6. Cholelithiasis and or sludge in the gallbladder lumen without pericholecystic stranding. 7. Mild anasarca with body wall edema and small effusions. Trace ascites. 8. Aortic atherosclerosis. Aortic Atherosclerosis (ICD10-I70.0). Electronically Signed   By: Zetta Bills M.D.   On: 05/08/2020 19:12   DG Abd Portable 1V  Result Date: 05/08/2020 CLINICAL DATA:  Abdominal pain EXAM: PORTABLE ABDOMEN - 1 VIEW COMPARISON:  None. FINDINGS: Spinal stimulator overlies the right lower  quadrant with lead coursing into the sacrum with tip not seen on this radiograph. Mildly dilated central abdominal small bowel loops up to 3.0 cm diameter. Mild gastric gaseous distention. Moderate colonic stool. No evidence of pneumatosis or pneumoperitoneum. No radiopaque nephrolithiasis. IMPRESSION: Mildly dilated central abdominal small bowel loops, cannot exclude ileus or mid to distal small bowel obstruction. Mild gastric gaseous distention. Moderate colonic stool. CT abdomen/pelvis with oral and IV contrast may be obtained for further evaluation as clinically warranted. Electronically Signed   By: Ilona Sorrel M.D.   On: 05/08/2020 11:52    Scheduled Meds: . sodium chloride   Intravenous Once  . atorvastatin  20 mg Oral Daily  . bisacodyl  10 mg Rectal Once  . Chlorhexidine Gluconate Cloth  6 each Topical Daily  . docusate sodium  100 mg Oral BID  . gabapentin  100 mg Oral TID  . insulin aspart  0-9 Units Subcutaneous TID WC  . lactulose  20 g Oral BID  . metoprolol tartrate  25 mg Oral BID  . pantoprazole (PROTONIX) IV  40 mg Intravenous Q12H  . QUEtiapine  300 mg Oral QHS  . rifaximin  550 mg Oral BID  . sevelamer carbonate  1,600 mg Oral TID WC  . torsemide  20 mg Oral Once per day on Mon Wed Fri  . venlafaxine XR  75 mg Oral Q breakfast   Continuous Infusions: . sodium chloride 100 mL/hr at 05/08/20 1401     LOS: 6 days   Time spent: 29 minutes   Darliss Cheney, MD [facility]  05/09/2020, 11:05 AM   To contact the attending provider between 7A-7P or the covering provider during after hours 7P-7A, please log into the web site [email or web address].

## 2020-05-09 NOTE — Progress Notes (Addendum)
Held most meds last night due to possible ileus. New IV placed. Please remove IV from wrist

## 2020-05-09 NOTE — Consult Note (Signed)
[facility] Surgery Consult Note  Tina Ramos Aug 27, 1956  [id number].    Requesting MD: Arta Silence Chief Complaint/Reason for Consult: constipation, ileus vs pSBO  HPI:  Tina Ramos is a 64yo female PMH DM, gastroparesis HLD, CKD-IV, cirrhosis 2/2 NASH, OSA, bipolar disorder, anxiety/depression, cervical radiculopathy, and multiple frequent falls who was admitted to [facility] 8/16 with an acute bilateral SDH secondary to fall. She also fell 8/9 and suffered a left humerus fracture requiring IM nail 8/18. Frequent falls worked up by primary and current etiology is unknown, suspect deconditioning vs vasovagal. She has been working with therapies and is planning SNF at discharge. Patient developed coffee ground emesis 8/18 and worsening anemia. She was taken for EGD 8/21 by GI which showed a large amount of food in the stomach but no active bleed. States that she still feels somewhat bloated but n/v has resolved. She is passing a lot of flatus but has only had a smear of a BM since admission 1 week ago. She reports a history of chronic constipation. Typically has 1-2 bowel movements per week. She takes miralax at home daily. Abdominal pain was evaluated by CT scan yesterday and shows marked constipation, mild dilatation of proximal to mid small bowel loops concerning for developing ileus versus partial small bowel obstruction.  General surgery asked to see.  She was given 1 dulcolax suppository this morning, and has been on colace 100mg  BID since 8/18.   Abdominal surgical history: appendectomy, hysterectomy Last colonoscopy 2018- per patient it was unremarkable Lives at home by herself  Review of Systems  Constitutional: Positive for malaise/fatigue. Negative for chills and fever.  Respiratory: Negative.   Cardiovascular: Negative.   Gastrointestinal: Positive for abdominal pain, constipation, nausea and vomiting.  Genitourinary: Negative.   Musculoskeletal: Positive for back  pain, falls and joint pain.  Neurological: Positive for sensory change and weakness.    All systems reviewed and otherwise negative except for as above  History reviewed. No pertinent family history.  Past Medical History:  Diagnosis Date  . Chronic kidney disease   . Cirrhosis ([place])   . Cirrhosis ([place])   . Diabetes mellitus without complication ([place])   . Gastroparesis   . Hyperlipemia   . Interstitial cystitis   . Migraine   . UTI (lower urinary tract infection)     Past Surgical History:  Procedure Laterality Date  . ABDOMINAL HYSTERECTOMY    . ABDOMINAL SURGERY    . APPENDECTOMY    . ESOPHAGEAL DILATION    . ESOPHAGOGASTRODUODENOSCOPY (EGD) WITH PROPOFOL N/A 05/07/2020   Procedure: ESOPHAGOGASTRODUODENOSCOPY (EGD) WITH PROPOFOL;  Surgeon: Ronald Lobo, MD;  Location: [facility];  Service: Endoscopy;  Laterality: N/A;  . FINGER SURGERY Left   . HUMERUS IM NAIL Left 05/04/2020   Procedure: LEFT INTRAMEDULLARY (IM) NAIL HUMERAL;  Surgeon: Hiram Gash, MD;  Location: [facility];  Service: Orthopedics;  Laterality: Left;  . TOE SURGERY      Social History:  reports that she has never smoked. She has never used smokeless tobacco. She reports that she does not drink alcohol and does not use drugs.  Allergies:  Allergies  Allergen Reactions  . Pentazocine Hives, Itching and Rash       . Propoxyphene Itching and Other (See Comments)    Behavioral Changes.   . Sulfa Antibiotics Hives, Itching, Swelling and Rash  . Codeine Hives, Itching and Rash  . Other Hives and Rash    Adhesive Tape    Medications Prior  to Admission  Medication Sig Dispense Refill  . aspirin EC 81 MG tablet Take 81 mg by mouth daily.    Marland Kitchen atorvastatin (LIPITOR) 20 MG tablet Take 20 mg by mouth daily.    . clonazePAM (KLONOPIN) 1 MG tablet Take 1 mg by mouth 2 (two) times daily as needed for anxiety.     Marland Kitchen desvenlafaxine (PRISTIQ) 50 MG 24 hr tablet Take 50 mg by mouth daily.    Marland Kitchen estradiol  (ESTRACE) 1 MG tablet Take 1 mg by mouth daily.    Marland Kitchen gabapentin (NEURONTIN) 100 MG capsule Take 100 mg by mouth 3 (three) times daily.    . insulin glargine (LANTUS) 100 UNIT/ML injection Inject 0-20 Units into the skin See admin instructions. Sliding scale: if BG is less than  < 150, 0 units and if > 150, 4 units. Pt increases by 1 unit per increase of 25 mmol/L in BG.    . oxybutynin (DITROPAN XL) 15 MG 24 hr tablet Take 1 tablet by mouth daily as needed (overactive bladder).     . pantoprazole (PROTONIX) 40 MG tablet Take 40 mg by mouth daily.    . QUEtiapine (SEROQUEL) 100 MG tablet Take 300 mg by mouth at bedtime.     . rizatriptan (MAXALT) 10 MG tablet Take 10-20 mg by mouth daily as needed for migraine.     . sevelamer carbonate (RENVELA) 800 MG tablet Take 1,600 mg by mouth 3 (three) times daily with meals.     . torsemide (DEMADEX) 20 MG tablet Take 20 mg by mouth 3 (three) times a week.    . fexofenadine (ALLEGRA) 60 MG tablet Take 1 tablet (60 mg total) by mouth 2 (two) times daily. (Patient not taking: Reported on 05/03/2020) 30 tablet 0  . HYDROcodone-acetaminophen (NORCO/VICODIN) 5-325 MG tablet Take 1-2 tablets by mouth every 6 (six) hours as needed. (Patient not taking: Reported on 05/03/2020) 15 tablet 0  . ibuprofen (ADVIL,MOTRIN) 600 MG tablet Take 1 tablet (600 mg total) by mouth every 6 (six) hours as needed. (Patient not taking: Reported on 05/03/2020) 30 tablet 0  . mupirocin cream (BACTROBAN) 2 % Apply 1 application topically 2 (two) times daily. For 7 days (Patient not taking: Reported on 05/03/2020) 15 g 0  . ondansetron (ZOFRAN ODT) 4 MG disintegrating tablet 4mg  ODT q4 hours prn nausea/vomit (Patient not taking: Reported on 05/03/2020) 20 tablet 0  . promethazine (PHENERGAN) 25 MG tablet Take 1 tablet (25 mg total) by mouth every 6 (six) hours as needed for nausea. (Patient not taking: Reported on 05/03/2020) 10 tablet 0    Prior to Admission medications   Medication Sig Start  Date End Date Taking? Authorizing Provider  aspirin EC 81 MG tablet Take 81 mg by mouth daily.   Yes [provider]  atorvastatin (LIPITOR) 20 MG tablet Take 20 mg by mouth daily.   Yes [provider]  clonazePAM (KLONOPIN) 1 MG tablet Take 1 mg by mouth 2 (two) times daily as needed for anxiety.  03/02/20  Yes [provider]  desvenlafaxine (PRISTIQ) 50 MG 24 hr tablet Take 50 mg by mouth daily. 03/28/20  Yes [provider]  estradiol (ESTRACE) 1 MG tablet Take 1 mg by mouth daily.   Yes [provider]  gabapentin (NEURONTIN) 100 MG capsule Take 100 mg by mouth 3 (three) times daily.   Yes [provider]  insulin glargine (LANTUS) 100 UNIT/ML injection Inject 0-20 Units into the skin See admin instructions.  Sliding scale: if BG is less than  < 150, 0 units and if > 150, 4 units. Pt increases by 1 unit per increase of 25 mmol/L in BG.   Yes [provider]  oxybutynin (DITROPAN XL) 15 MG 24 hr tablet Take 1 tablet by mouth daily as needed (overactive bladder).  05/02/20  Yes [provider]  pantoprazole (PROTONIX) 40 MG tablet Take 40 mg by mouth daily.   Yes [provider]  QUEtiapine (SEROQUEL) 100 MG tablet Take 300 mg by mouth at bedtime.    Yes [provider]  rizatriptan (MAXALT) 10 MG tablet Take 10-20 mg by mouth daily as needed for migraine.    Yes [provider]  sevelamer carbonate (RENVELA) 800 MG tablet Take 1,600 mg by mouth 3 (three) times daily with meals.    Yes [provider]  torsemide (DEMADEX) 20 MG tablet Take 20 mg by mouth 3 (three) times a week. 04/15/20  Yes [provider]  fexofenadine (ALLEGRA) 60 MG tablet Take 1 tablet (60 mg total) by mouth 2 (two) times daily. Patient not taking: Reported on 05/03/2020 01/23/14   Malvin Johns, MD  HYDROcodone-acetaminophen (NORCO/VICODIN) 5-325 MG tablet Take 1-2 tablets by mouth every 6 (six) hours as  needed. Patient not taking: Reported on 05/03/2020 04/26/20   Horton, Barbette Hair, MD  ibuprofen (ADVIL,MOTRIN) 600 MG tablet Take 1 tablet (600 mg total) by mouth every 6 (six) hours as needed. Patient not taking: Reported on 05/03/2020 11/27/14   Varney Biles, MD  mupirocin cream (BACTROBAN) 2 % Apply 1 application topically 2 (two) times daily. For 7 days Patient not taking: Reported on 05/03/2020 03/17/15   Malvin Johns, MD  ondansetron (ZOFRAN ODT) 4 MG disintegrating tablet 4mg  ODT q4 hours prn nausea/vomit Patient not taking: Reported on 05/03/2020 10/02/18   Deno Etienne, DO  oxyCODONE (OXY IR/ROXICODONE) 5 MG immediate release tablet Take 1 pills every 4-6 hrs as needed for pain 05/07/20   McBane, Maylene Roes, PA-C  promethazine (PHENERGAN) 25 MG tablet Take 1 tablet (25 mg total) by mouth every 6 (six) hours as needed for nausea. Patient not taking: Reported on 05/03/2020 11/27/14   Varney Biles, MD    Blood pressure (!) 135/46, pulse 98, temperature 98.7 F (37.1 C), temperature source Oral, resp. rate 14, SpO2 97 %. Physical Exam: General: pleasant, WD/WN white female who is laying in bed in NAD HEENT: head is normocephalic, atraumatic.  Sclera are noninjected.  PERRL.  Ears and nose without any masses or lesions.  Mouth is pink and moist. Dentition fair Heart: regular, rate, and rhythm.  Normal s1,s2. No obvious murmurs, gallops, or rubs noted.  Palpable pedal pulses bilaterally  Lungs: CTAB, no wheezes, rhonchi, or rales noted.  Respiratory effort nonlabored Abd: soft, mild distension, nontender, +BS, no masses, hernias, or organomegaly MS: calves soft and nontender. Sling to LUE Skin: warm and dry with no masses, lesions, or rashes Psych: A&Ox4 with an appropriate affect Neuro: cranial nerves grossly intact, moving all 4 extremities, voice is soft, thought process intact  Results for orders placed or performed during the hospital encounter of 05/02/20 (from the past 48 hour(s))   Glucose, capillary     Status: Abnormal   Collection Time: 05/07/20  4:06 PM  Result Value Ref Range   Glucose-Capillary 168 (H) 70 - 99 mg/dL    Comment: Glucose reference range applies only to samples taken after fasting for at least 8 hours.  CBC  Status: Abnormal   Collection Time: 05/07/20  4:49 PM  Result Value Ref Range   WBC 10.6 (H) 4.0 - 10.5 K/uL   RBC 2.95 (L) 3.87 - 5.11 MIL/uL   Hemoglobin 9.7 (L) 12.0 - 15.0 g/dL   HCT 29.5 (L) 36 - 46 %   MCV 100.0 80.0 - 100.0 fL   MCH 32.9 26.0 - 34.0 pg   MCHC 32.9 30.0 - 36.0 g/dL   RDW 17.2 (H) 11.5 - 15.5 %   Platelets 194 150 - 400 K/uL   nRBC 1.0 (H) 0.0 - 0.2 %    Comment: Performed at [facility] [address], [place], [place] [address]  Glucose, capillary     Status: Abnormal   Collection Time: 05/07/20  9:53 PM  Result Value Ref Range   Glucose-Capillary 138 (H) 70 - 99 mg/dL    Comment: Glucose reference range applies only to samples taken after fasting for at least 8 hours.  Glucose, capillary     Status: Abnormal   Collection Time: 05/08/20  7:41 AM  Result Value Ref Range   Glucose-Capillary 124 (H) 70 - 99 mg/dL    Comment: Glucose reference range applies only to samples taken after fasting for at least 8 hours.  CBC     Status: Abnormal   Collection Time: 05/08/20  8:28 AM  Result Value Ref Range   WBC 6.1 4.0 - 10.5 K/uL   RBC 2.65 (L) 3.87 - 5.11 MIL/uL   Hemoglobin 8.5 (L) 12.0 - 15.0 g/dL   HCT 27.6 (L) 36 - 46 %   MCV 104.2 (H) 80.0 - 100.0 fL   MCH 32.1 26.0 - 34.0 pg   MCHC 30.8 30.0 - 36.0 g/dL   RDW 17.2 (H) 11.5 - 15.5 %   Platelets 181 150 - 400 K/uL   nRBC 0.8 (H) 0.0 - 0.2 %    Comment: Performed at [facility] [address] [place], [place] [address]  Basic metabolic panel     Status: Abnormal   Collection Time: 05/08/20  8:28 AM  Result Value Ref Range   Sodium 140 135 - 145 mmol/L   Potassium 4.8 3.5 - 5.1 mmol/L   Chloride 109 98 - 111 mmol/L   CO2 21 (L) 22 -  32 mmol/L   Glucose, Bld 132 (H) 70 - 99 mg/dL    Comment: Glucose reference range applies only to samples taken after fasting for at least 8 hours.   BUN 28 (H) 8 - 23 mg/dL   Creatinine, Ser 2.24 (H) 0.44 - 1.00 mg/dL   Calcium 7.8 (L) 8.9 - 10.3 mg/dL   GFR calc non Af Amer 22 (L) >60 mL/min   GFR calc Af Amer 26 (L) >60 mL/min   Anion gap 10 5 - 15    Comment: Performed at [facility] [address] [place], [place] [address]  Procalcitonin - Baseline     Status: None   Collection Time: 05/08/20  8:28 AM  Result Value Ref Range   Procalcitonin 0.32 ng/mL    Comment:        Interpretation: PCT (Procalcitonin) <= 0.5 ng/mL: Systemic infection (sepsis) is not likely. Local bacterial infection is possible. (NOTE)       Sepsis PCT Algorithm           Lower Respiratory Tract  Infection PCT Algorithm    ----------------------------     ----------------------------         PCT < 0.25 ng/mL                PCT < 0.10 ng/mL          Strongly encourage             Strongly discourage   discontinuation of antibiotics    initiation of antibiotics    ----------------------------     -----------------------------       PCT 0.25 - 0.50 ng/mL            PCT 0.10 - 0.25 ng/mL               OR       >80% decrease in PCT            Discourage initiation of                                            antibiotics      Encourage discontinuation           of antibiotics    ----------------------------     -----------------------------         PCT >= 0.50 ng/mL              PCT 0.26 - 0.50 ng/mL               AND        <80% decrease in PCT             Encourage initiation of                                             antibiotics       Encourage continuation           of antibiotics    ----------------------------     -----------------------------        PCT >= 0.50 ng/mL                  PCT > 0.50 ng/mL               AND         increase in PCT                   Strongly encourage                                      initiation of antibiotics    Strongly encourage escalation           of antibiotics                                     -----------------------------                                           PCT <= 0.25 ng/mL  OR                                        > 80% decrease in PCT                                      Discontinue / Do not initiate                                             antibiotics  Performed at [facility], [place] [address], [place], [place] [address]   Lactic acid, plasma     Status: None   Collection Time: 05/08/20  8:28 AM  Result Value Ref Range   Lactic Acid, Venous 1.0 0.5 - 1.9 mmol/L    Comment: Performed at [facility] [address] [place], [place] [address]  Glucose, capillary     Status: Abnormal   Collection Time: 05/08/20 12:44 PM  Result Value Ref Range   Glucose-Capillary 170 (H) 70 - 99 mg/dL    Comment: Glucose reference range applies only to samples taken after fasting for at least 8 hours.  Glucose, capillary     Status: Abnormal   Collection Time: 05/08/20  4:45 PM  Result Value Ref Range   Glucose-Capillary 113 (H) 70 - 99 mg/dL    Comment: Glucose reference range applies only to samples taken after fasting for at least 8 hours.  Glucose, capillary     Status: Abnormal   Collection Time: 05/08/20  9:34 PM  Result Value Ref Range   Glucose-Capillary 117 (H) 70 - 99 mg/dL    Comment: Glucose reference range applies only to samples taken after fasting for at least 8 hours.  CBC with Differential/Platelet     Status: Abnormal   Collection Time: 05/09/20  6:53 AM  Result Value Ref Range   WBC 6.5 4.0 - 10.5 K/uL   RBC 2.46 (L) 3.87 - 5.11 MIL/uL   Hemoglobin 7.8 (L) 12.0 - 15.0 g/dL   HCT 26.1 (L) 36 - 46 %   MCV 106.1 (H) 80.0 - 100.0 fL   MCH 31.7 26.0 - 34.0 pg   MCHC 29.9 (L) 30.0 - 36.0 g/dL   RDW 16.8 (H)  11.5 - 15.5 %   Platelets 183 150 - 400 K/uL   nRBC 0.5 (H) 0.0 - 0.2 %   Neutrophils Relative % 63 %   Neutro Abs 4.1 1.7 - 7.7 K/uL   Lymphocytes Relative 17 %   Lymphs Abs 1.1 0.7 - 4.0 K/uL   Monocytes Relative 13 %   Monocytes Absolute 0.8 0 - 1 K/uL   Eosinophils Relative 6 %   Eosinophils Absolute 0.4 0 - 0 K/uL   Basophils Relative 0 %   Basophils Absolute 0.0 0 - 0 K/uL   Immature Granulocytes 1 %   Abs Immature Granulocytes 0.06 0.00 - 0.07 K/uL    Comment: Performed at [facility], [address] [place], [place] [address]  Basic metabolic panel     Status: Abnormal   Collection Time: 05/09/20  6:53 AM  Result Value Ref Range   Sodium 140 135 - 145 mmol/L   Potassium  5.4 (H) 3.5 - 5.1 mmol/L   Chloride 110 98 - 111 mmol/L   CO2 20 (L) 22 - 32 mmol/L   Glucose, Bld 118 (H) 70 - 99 mg/dL    Comment: Glucose reference range applies only to samples taken after fasting for at least 8 hours.   BUN 30 (H) 8 - 23 mg/dL   Creatinine, Ser 2.07 (H) 0.44 - 1.00 mg/dL   Calcium 7.7 (L) 8.9 - 10.3 mg/dL   GFR calc non Af Amer 25 (L) >60 mL/min   GFR calc Af Amer 28 (L) >60 mL/min   Anion gap 10 5 - 15    Comment: Performed at [facility] [address] [place], [place] [address]  Glucose, capillary     Status: Abnormal   Collection Time: 05/09/20  7:44 AM  Result Value Ref Range   Glucose-Capillary 109 (H) 70 - 99 mg/dL    Comment: Glucose reference range applies only to samples taken after fasting for at least 8 hours.  Magnesium     Status: None   Collection Time: 05/09/20 10:19 AM  Result Value Ref Range   Magnesium 2.1 1.7 - 2.4 mg/dL    Comment: Performed at [facility] [address], [place], [place] [address]  Glucose, capillary     Status: Abnormal   Collection Time: 05/09/20 11:40 AM  Result Value Ref Range   Glucose-Capillary 114 (H) 70 - 99 mg/dL    Comment: Glucose reference range applies only to samples taken after fasting for at least  8 hours.   CT ABDOMEN PELVIS WO CONTRAST  Result Date: 05/08/2020 CLINICAL DATA:  Central abdominal pain. EXAM: CT ABDOMEN AND PELVIS WITHOUT CONTRAST TECHNIQUE: Multidetector CT imaging of the abdomen and pelvis was performed following the standard protocol without IV contrast. COMPARISON:  August 19, 2019 FINDINGS: Lower chest: Moderate RIGHT small LEFT pleural effusion. Basilar atelectasis. Patulous distal esophagus is filled with ingested contrast. No pericardial effusion. Heart is incompletely imaged. Hepatobiliary: Evidence of hepatic cirrhosis. Cholelithiasis and or sludge in the gallbladder lumen without pericholecystic stranding. Pancreas: Pancreas is atrophic without ductal dilation or inflammation. Spleen: Spleen mildly enlarged. Adrenals/Urinary Tract: Adrenal glands are normal. Bilateral perinephric stranding in the setting of mild generalized edema. Renal cortical scarring. No hydronephrosis. No nephrolithiasis. Urinary bladder with small amount of gas in the anti dependent portion. Bladder is under distended. Clear fat plane between the urinary bladder in the adjacent bowel. No signs of bladder wall thickening to indicate fistula. Stomach/Bowel: Marked stool in the colon throughout the colon well-formed stool as it transitions more distally, partially liquid stool seen proximally. No pericolonic stranding. Small bowel partially filled with ingested contrast material. Moderately dilated in the mid small bowel up to approximately 3 cm dilated loops are centralized with transition, gradual transition at the distal jejunum. Some dilute contrast distal to the site of transition. No perienteric stranding. Transition occurring in the LEFT mid abdomen on images 43-50. Vascular/Lymphatic: Scattered calcified atheromatous plaque of the abdominal aorta. No aneurysmal dilation. No adenopathy. Reproductive: Post hysterectomy.  No adnexal mass. Other: No ascites.  No free air. Musculoskeletal: Sacral nerve  stimulator in place passing through the S2 neural foramen on the LEFT. Power pack over RIGHT gluteal region. No acute musculoskeletal process. Spinalw degenerative changes. IMPRESSION: 1. Developing ileus versus partial small bowel obstruction in the setting of marked constipation. Dilated small bowel loops are however not distal small bowel loops but are proximal to mid small bowel  loops. 2. Moderate RIGHT small LEFT pleural effusion. 3. Patulous esophagus may indicate dysmotility are marked reflux. 4. Bilateral perinephric stranding, nonspecific in the setting of developing anasarca. Correlate with urine studies to exclude the possibility of urinary tract infection. 5. Cirrhosis and portal hypertension as before. 6. Cholelithiasis and or sludge in the gallbladder lumen without pericholecystic stranding. 7. Mild anasarca with body wall edema and small effusions. Trace ascites. 8. Aortic atherosclerosis. Aortic Atherosclerosis (ICD10-I70.0). Electronically Signed   By: Zetta Bills M.D.   On: 05/08/2020 19:12   DG Abd Portable 1V  Result Date: 05/08/2020 CLINICAL DATA:  Abdominal pain EXAM: PORTABLE ABDOMEN - 1 VIEW COMPARISON:  None. FINDINGS: Spinal stimulator overlies the right lower quadrant with lead coursing into the sacrum with tip not seen on this radiograph. Mildly dilated central abdominal small bowel loops up to 3.0 cm diameter. Mild gastric gaseous distention. Moderate colonic stool. No evidence of pneumatosis or pneumoperitoneum. No radiopaque nephrolithiasis. IMPRESSION: Mildly dilated central abdominal small bowel loops, cannot exclude ileus or mid to distal small bowel obstruction. Mild gastric gaseous distention. Moderate colonic stool. CT abdomen/pelvis with oral and IV contrast may be obtained for further evaluation as clinically warranted. Electronically Signed   By: Ilona Sorrel M.D.   On: 05/08/2020 11:52   Anti-infectives (From admission, onward)   Start     Dose/Rate Route Frequency  Ordered Stop   05/06/20 1800  rifaximin (XIFAXAN) tablet 550 mg        550 mg Oral 2 times daily 05/06/20 1538     05/04/20 1700  ceFAZolin (ANCEF) IVPB 1 g/50 mL premix        1 g 100 mL/hr over 30 Minutes Intravenous Every 6 hours 05/04/20 1639 05/05/20 0514   05/04/20 1349  vancomycin (VANCOCIN) powder  Status:  Discontinued          As needed 05/04/20 1349 05/04/20 1520   05/04/20 1314  ceFAZolin (ANCEF) 2-4 GM/100ML-% IVPB  Status:  Discontinued       Note to Pharmacy: Laurita Quint   : cabinet override      05/04/20 1314 05/04/20 1532   05/04/20 1000  ceFAZolin (ANCEF) IVPB 2g/100 mL premix        2 g 200 mL/hr over 30 Minutes Intravenous  Once 05/03/20 1316 05/04/20 0949        Assessment/Plan DM Gastroparesis HLD CKD-IV Cirrhosis 2/2 NASH OSA Bipolar disorder Anxiety/depression Cervical radiculopathy Multiple frequent falls Recent bilateral SDH secondary to fall Left humerus fracture s/p IM nail 05/04/20  Constipation Ileus - CT scan shows marked constipation, mild dilatation of proximal to mid small bowel loops concerning for developing ileus versus partial small bowel obstruction - has a history of chronic constipation for which she takes miralax daily at home  ID - currently rifaximin 8/20>> VTE - SCDs, per primary FEN - IVF, NPO Foley - none Follow up - TBD  Plan - Patient looks to have acute on chronic constipation, likely worse due to recent surgery, narcotics, and limited mobility. She does have some small bowel dilatation on CT that could represent an ileus, but it is mild and her n/v has resolved. She is passing a lot of flatus today. I think she can be started back on a liquid diet. Recommend aggressive bowel regimen with enema(s), daily miralax/colace. Monitor electrolytes and keep K>4 and Mag>2. Minimize narcotics. If n/v returns she may need an NG tube, but hopefully a bowel regimen will help. There is no role for acute  surgical intervention. General  surgery will sign off, please call with concerns.   Wellington Hampshire, [facility] 05/09/2020, 2:55 PM Please see Amion for pager number during day hours 7:00am-4:30pm

## 2020-05-10 LAB — CBC WITH DIFFERENTIAL/PLATELET
Abs Immature Granulocytes: 0.08 10*3/uL — ABNORMAL HIGH (ref 0.00–0.07)
Basophils Absolute: 0 10*3/uL (ref 0.0–0.1)
Basophils Relative: 0 %
Eosinophils Absolute: 0.2 10*3/uL (ref 0.0–0.5)
Eosinophils Relative: 1 %
HCT: 27.9 % — ABNORMAL LOW (ref 36.0–46.0)
Hemoglobin: 8.7 g/dL — ABNORMAL LOW (ref 12.0–15.0)
Immature Granulocytes: 1 %
Lymphocytes Relative: 10 %
Lymphs Abs: 1.1 10*3/uL (ref 0.7–4.0)
MCH: 32.5 pg (ref 26.0–34.0)
MCHC: 31.2 g/dL (ref 30.0–36.0)
MCV: 104.1 fL — ABNORMAL HIGH (ref 80.0–100.0)
Monocytes Absolute: 1.2 10*3/uL — ABNORMAL HIGH (ref 0.1–1.0)
Monocytes Relative: 10 %
Neutro Abs: 9.1 10*3/uL — ABNORMAL HIGH (ref 1.7–7.7)
Neutrophils Relative %: 78 %
Platelets: 248 10*3/uL (ref 150–400)
RBC: 2.68 MIL/uL — ABNORMAL LOW (ref 3.87–5.11)
RDW: 17 % — ABNORMAL HIGH (ref 11.5–15.5)
WBC: 11.6 10*3/uL — ABNORMAL HIGH (ref 4.0–10.5)
nRBC: 0.2 % (ref 0.0–0.2)

## 2020-05-10 LAB — GLUCOSE, CAPILLARY
Glucose-Capillary: 112 mg/dL — ABNORMAL HIGH (ref 70–99)
Glucose-Capillary: 123 mg/dL — ABNORMAL HIGH (ref 70–99)
Glucose-Capillary: 128 mg/dL — ABNORMAL HIGH (ref 70–99)
Glucose-Capillary: 144 mg/dL — ABNORMAL HIGH (ref 70–99)
Glucose-Capillary: 147 mg/dL — ABNORMAL HIGH (ref 70–99)
Glucose-Capillary: 161 mg/dL — ABNORMAL HIGH (ref 70–99)

## 2020-05-10 LAB — BASIC METABOLIC PANEL
Anion gap: 10 (ref 5–15)
BUN: 29 mg/dL — ABNORMAL HIGH (ref 8–23)
CO2: 20 mmol/L — ABNORMAL LOW (ref 22–32)
Calcium: 8.3 mg/dL — ABNORMAL LOW (ref 8.9–10.3)
Chloride: 112 mmol/L — ABNORMAL HIGH (ref 98–111)
Creatinine, Ser: 1.98 mg/dL — ABNORMAL HIGH (ref 0.44–1.00)
GFR calc Af Amer: 30 mL/min — ABNORMAL LOW (ref >60)
GFR calc non Af Amer: 26 mL/min — ABNORMAL LOW (ref >60)
Glucose, Bld: 125 mg/dL — ABNORMAL HIGH (ref 70–99)
Potassium: 5.2 mmol/L — ABNORMAL HIGH (ref 3.5–5.1)
Sodium: 142 mmol/L (ref 135–145)

## 2020-05-10 LAB — LACTIC ACID, PLASMA
Lactic Acid, Venous: 1.3 mmol/L (ref 0.5–1.9)
Lactic Acid, Venous: 2.1 mmol/L (ref 0.5–1.9)
Lactic Acid, Venous: 1.9 mmol/L (ref 0.5–1.9)
Lactic Acid, Venous: 1.7 mmol/L (ref 0.5–1.9)

## 2020-05-10 NOTE — Progress Notes (Signed)
PROGRESS NOTE    Tina Ramos  OIT:[id number] DOB: [birth year] DOA: 05/02/2020 PCP: [facility], [facility]   Brief Narrative:  Tina Ramos is a 64 y.o. female with medical history significant of liver cirrhosis, insulin-dependent type 2 diabetes, hyperlipidemia, gastroparesis, CKD stage IV presented to the ED after sustaining head injury from a fall.  Patient was recently evaluated in the ED on 8/9 after a fall and was diagnosed with a mildly displaced spiral fracture of the proximal diaphysis of the left humerus. she was not able to see orthopedics yet.   Complained of extremely swollen and painful left upper extremity associated with numbness and tingling in her fingers in the same extremity. she had multiple falls in the past 2 weeks.  Each time she describes it as just falling on the ground all of a sudden. Denied prodromal symptoms or any history of seizure. her 66 year old father takes care of her. She takes a baby aspirin daily.  Upon arrival to ED she was a stable with COVID-19 negative. Head CT showing acute bilateral subdural hematomas which extend along the tentorium, right greater than left.  Right posterior parietal scalp laceration and hematoma.  CT maxillofacial showing nondisplaced bilateral nasal bone fractures. CT C-spine negative for acute abnormality. X-ray of right shoulder negative for acute fracture or dislocation. Chest x-ray showing no acute intrathoracic process.    Extremity x-ray shows known displaced left humeral diaphyseal fracture.  ED provider discussed the case with Dr. Trenton Gammon from neurosurgery who felt that the subdural hematomas were small.  He recommended neurochecks and potential discharge from the ED. Admission requested as it was not safe for the patient to go home given frequency of falls.  Scalp laceration was repaired in the ED. Foley catheter was placed as patient does self-catheterization at home. Patient received fentanyl, IV fluid, and Tdap  booster.  Admitted under hospitalist service.  Orthopedics were consulted to see her on urgent basis for concern of possible compartment syndrome in the left upper extremity.  They ruled out compartment syndrome.  Subsequently she underwent  left humeral shaft intramedullary nail fixation 05/04/2020.  Repeat head CT showed stable subdural hematomas. she had one episode of vomiting which was thought to be black on late morning of 05/04/2020.  Patient denied having any history of GI bleed in the past but she has history of GERD for which she takes Protonix.  Hemoglobin was repeated which was stable.  For some reason, sample was not taken for occult blood.  Subsequently over the course of next 2 days, her hemoglobin dropped to 6.4 on 05/06/2020 and she received 1 unit of PRBC transfusion and GI was consulted and she underwent EGD on 05/07/2020 which was negative for any acute bleeding or any pathology   Assessment & Plan:   Principal Problem:   Subdural hematoma (HCC) Active Problems:   Humerus fracture   Scalp laceration   Nasal bone fractures   Falls frequently   Ileus (HCC)   SBO (small bowel obstruction) (HCC)   Acute bilateral subdural hematomas secondary to a fall: Patient takes aspirin 81 mg daily at home.  Head CT showing acute bilateral subdural hematomas which extend along the tentorium, right greater than left.  ED provider discussed the case with neurosurgery and and it was felt that the hematomas were small and neurosurgery in fact recommended discharging patient home however due to frequent falls, she was admitted under hospital service.  There is no consultation note from neurosurgery.  Repeat CT  head stable.   Displaced spiral fracture of the proximal diaphysis of the left humerus: Diagnosed after a fall on 8/9.  Imaging done this hospitalization showing increased displacement. Now status post left humeral shaft intramedullary nail fixation 05/04/2020.  Scalp laceration -Repaired in  the ED. Tdap booster was given.  Nondisplaced bilateral nasal bone fractures: Seen on CT. ENT follow-up as outpatient.  Frequent falls: Etiology unknown.  Likely due to deconditioning or vasovagal.  No event on telemetry noted so far.  Echo with normal ejection fraction and no wall motion abnormality and no valvular heart disease.  EEG negative for any epileptiform discharges.  Seen by PT OT and they recommend SNF.  Patient agreeable to go to SNF as well.  Acute on chronic anemia/hematemesis: Hemoglobin 7.7, MCV 103.8.  Hemoglobin was 10.4 on labs done in March 2021.  Patient is not endorsing any symptoms of GI bleed but she tells me that she bruises easily because it " a woman's skin".  Does have significant ecchymosis to her chest wall and left upper extremity from her recent fall which could possibly be contributing to the dropping hemoglobin to some degree.  Reportedly, she had one episode of vomiting which was black on late morning of 05/04/2020.  Patient denies having any history of GI bleed in the past but she has history of GERD for which she takes Protonix.  Subsequently her hemoglobin dropped to 6.4 on 05/06/2020.  She received 1 unit of PRBC transfusion.  GI was consulted.  She was started on octreotide and underwent EGD twhich is unremarkable so octreotide was stopped.  Hemoglobin is stable.  Hyperlipidemia: Holding atorvastatin for now.  Essential hypertension: Blood pressure Now controlled. -Continue hydralazine PRN SBP >170.   Sinus tachycardia/fever: Patient's tachycardia has improved. Per chart, patient had fever of 102.3 at 8 PM on 05/09/2020.  Patient does not recall that.  She has no symptoms currently.Has mild leukocytosis but she has remained afebrile since then.  Lactic acid normal.  We will watch for now.  Fever/ileus/small bowel obstruction: Afebrile for more than 24 hours now.  No more nausea or vomiting.  X-ray and CT abdomen indicates ileus/small bowel obstruction.  GI  consulted general surgery who did not think patient needs any surgical intervention.  They recommended continuing supportive care.  Patient is feeling better today.  I have started her on clear liquid diet.  She has had 4 bowel movements in last 24 hours and is passing flatus.  Insulin-dependent type 2 diabetes: Hemoglobin A1c 6.0.  Controlled blood sugar now.  Continue SSI.  Mood disorder: -Resume home medications  CKD stage IV: Renal function at baseline. -Resume home Renvela.  Hyperkalemia: 5.2, better than yesterday.  Continue IV fluids.  Recheck in the morning.   DVT prophylaxis: SCDs Start: 05/04/20 1640 SCDs Start: 05/03/20 0325   Code Status: Full Code  Family Communication: None present at bedside.  Plan of care discussed with patient in length and she verbalized understanding and agreed with it.  Status is: Inpatient  Remains inpatient appropriate because:Inpatient level of care appropriate due to severity of illness   Dispo: The patient is from: Home              Anticipated d/c is to: SNF              Anticipated d/c date is: 1 to 2 days              Patient currently is not medically stable to d/c.  Estimated body mass index is 25.06 kg/m as calculated from the following:   Height as of 04/25/20: 5\' 2"  (1.575 m).   Weight as of 04/25/20: 62.1 kg.      Nutritional status:               Consultants:   Orthopedics  GI  General surgery  Procedures:  left humeral shaft intramedullary nail fixation 05/04/2020. EGD 05/07/2020  Antimicrobials:  Anti-infectives (From admission, onward)   Start     Dose/Rate Route Frequency Ordered Stop   05/06/20 1800  rifaximin (XIFAXAN) tablet 550 mg        550 mg Oral 2 times daily 05/06/20 1538     05/04/20 1700  ceFAZolin (ANCEF) IVPB 1 g/50 mL premix        1 g 100 mL/hr over 30 Minutes Intravenous Every 6 hours 05/04/20 1639 05/05/20 0514   05/04/20 1349  vancomycin (VANCOCIN) powder  Status:   Discontinued          As needed 05/04/20 1349 05/04/20 1520   05/04/20 1314  ceFAZolin (ANCEF) 2-4 GM/100ML-% IVPB  Status:  Discontinued       Note to Pharmacy: Laurita Quint   : cabinet override      05/04/20 1314 05/04/20 1532   05/04/20 1000  ceFAZolin (ANCEF) IVPB 2g/100 mL premix        2 g 200 mL/hr over 30 Minutes Intravenous  Once 05/03/20 1316 05/04/20 0949         Subjective: Seen and examined.  As always, she is pleasant and has no complaints.  No nausea or vomiting.  Passing flatus and has had 4 bowel movements in last 24 hours.  Objective: Vitals:   05/09/20 2317 05/10/20 0312 05/10/20 0732 05/10/20 1200  BP: (!) 172/55  (!) 128/51 (!) 131/46  Pulse: 95  82 77  Resp: 17 20 20 14   Temp: 99.5 F (37.5 C) 98.9 F (37.2 C) 98.7 F (37.1 C) 98.6 F (37 C)  TempSrc: Oral Oral Oral Oral  SpO2: 98%  100% 100%    Intake/Output Summary (Last 24 hours) at 05/10/2020 1243 Last data filed at 05/09/2020 2300 Gross per 24 hour  Intake 0 ml  Output 950 ml  Net -950 ml   There were no vitals filed for this visit.  Examination:  General exam: Appears calm and comfortable  Respiratory system: Clear to auscultation. Respiratory effort normal. Cardiovascular system: S1 & S2 heard, RRR. No JVD, murmurs, rubs, gallops or clicks. No pedal edema. Gastrointestinal system: Abdomen is nondistended, soft and nontender. No organomegaly or masses felt. Normal bowel sounds heard. Central nervous system: Alert and oriented. No focal neurological deficits. Extremities: Symmetric 5 x 5 power. Skin: No rashes, lesions or ulcers.  Psychiatry: Judgement and insight appear poor. Mood & affect appropriate.    Data Reviewed: I have personally reviewed following labs and imaging studies  CBC: Recent Labs  Lab 05/05/20 1837 05/05/20 1837 05/06/20 0601 05/06/20 0601 05/06/20 1855 05/07/20 1649 05/08/20 0828 05/09/20 0653 05/10/20 0614  WBC 11.5*   < > 7.9   < > 9.9 10.6* 6.1 6.5  11.6*  NEUTROABS 7.4  --  5.2  --  5.7  --   --  4.1 9.1*  HGB 7.0*   < > 6.4*   < > 9.0* 9.7* 8.5* 7.8* 8.7*  HCT 22.9*   < > 21.4*   < > 27.3* 29.5* 27.6* 26.1* 27.9*  MCV 108.0*   < > 109.2*   < >  101.9* 100.0 104.2* 106.1* 104.1*  PLT 201   < > 154   < > 175 194 181 183 248   < > = values in this interval not displayed.   Basic Metabolic Panel: Recent Labs  Lab 05/04/20 0810 05/04/20 0810 05/05/20 0836 05/05/20 0836 05/06/20 0601 05/07/20 0514 05/08/20 0828 05/09/20 0653 05/09/20 1019 05/10/20 0614  NA 139   < > 141   < > 135 136 140 140  --  142  K 5.1   < > 4.7   < > 4.8 5.3* 4.8 5.4*  --  5.2*  CL 104   < > 109   < > 108 105 109 110  --  112*  CO2 21*   < > 19*   < > 18* 22 21* 20*  --  20*  GLUCOSE 122*   < > 130*   < > 133* 156* 132* 118*  --  125*  BUN 34*   < > 28*   < > 25* 28* 28* 30*  --  29*  CREATININE 2.66*   < > 2.39*   < > 2.32* 2.61* 2.24* 2.07*  --  1.98*  CALCIUM 8.2*   < > 7.7*   < > 7.5* 7.9* 7.8* 7.7*  --  8.3*  MG 2.1  --  2.0  --   --   --   --   --  2.1  --   PHOS 2.4*  --   --   --   --   --   --   --   --   --    < > = values in this interval not displayed.   GFR: CrCl cannot be calculated (Unknown ideal weight.). Liver Function Tests: No results for input(s): AST, ALT, ALKPHOS, BILITOT, PROT, ALBUMIN in the last 168 hours. No results for input(s): LIPASE, AMYLASE in the last 168 hours. No results for input(s): AMMONIA in the last 168 hours. Coagulation Profile: Recent Labs  Lab 05/07/20 0514  INR 1.7*   Cardiac Enzymes: No results for input(s): CKTOTAL, CKMB, CKMBINDEX, TROPONINI in the last 168 hours. BNP (last 3 results) No results for input(s): PROBNP in the last 8760 hours. HbA1C: No results for input(s): HGBA1C in the last 72 hours. CBG: Recent Labs  Lab 05/09/20 2014 05/09/20 2316 05/10/20 0324 05/10/20 0729 05/10/20 1217  GLUCAP 133* 134* 123* 128* 147*   Lipid Profile: No results for input(s): CHOL, HDL, LDLCALC,  TRIG, CHOLHDL, LDLDIRECT in the last 72 hours. Thyroid Function Tests: No results for input(s): TSH, T4TOTAL, FREET4, T3FREE, THYROIDAB in the last 72 hours. Anemia Panel: No results for input(s): VITAMINB12, FOLATE, FERRITIN, TIBC, IRON, RETICCTPCT in the last 72 hours. Sepsis Labs: Recent Labs  Lab 05/08/20 0828 05/10/20 0815  PROCALCITON 0.32  --   LATICACIDVEN 1.0 1.3    Recent Results (from the past 240 hour(s))  SARS Coronavirus 2 by RT PCR (hospital order, performed in [facility] hospital lab) Nasopharyngeal Nasopharyngeal Swab     Status: None   Collection Time: 05/02/20  7:28 PM   Specimen: Nasopharyngeal Swab  Result Value Ref Range Status   SARS Coronavirus 2 NEGATIVE NEGATIVE Final    Comment: (NOTE) SARS-CoV-2 target nucleic acids are NOT DETECTED.  The SARS-CoV-2 RNA is generally detectable in upper and lower respiratory specimens during the acute phase of infection. The lowest concentration of SARS-CoV-2 viral copies this assay can detect is 250 copies / mL. A negative result does not  preclude SARS-CoV-2 infection and should not be used as the sole basis for treatment or other patient management decisions.  A negative result may occur with improper specimen collection / handling, submission of specimen other than nasopharyngeal swab, presence of viral mutation(s) within the areas targeted by this assay, and inadequate number of viral copies (<250 copies / mL). A negative result must be combined with clinical observations, patient history, and epidemiological information.  Fact Sheet for Patients:   StrictlyIdeas.no  Fact Sheet for Healthcare Providers: BankingDealers.co.za  This test is not yet approved or  cleared by the [place] FDA and has been authorized for detection and/or diagnosis of SARS-CoV-2 by FDA under an Emergency Use Authorization (EUA).  This EUA will remain in effect (meaning this test can  be used) for the duration of the COVID-19 declaration under Section 564(b)(1) of the Act, 21 U.S.C. section 360bbb-3(b)(1), unless the authorization is terminated or revoked sooner.  Performed at [facility], [address] [place], [place] [address]   MRSA PCR Screening     Status: None   Collection Time: 05/02/20 11:40 PM   Specimen: Nasal Mucosa; Nasopharyngeal  Result Value Ref Range Status   MRSA by PCR NEGATIVE NEGATIVE Final    Comment:        The GeneXpert MRSA Assay (FDA approved for NASAL specimens only), is one component of a comprehensive MRSA colonization surveillance program. It is not intended to diagnose MRSA infection nor to guide or monitor treatment for MRSA infections. Performed at [facility], [place] [address] [place], [place] [address]       Radiology Studies: CT ABDOMEN PELVIS WO CONTRAST  Result Date: 05/08/2020 CLINICAL DATA:  Central abdominal pain. EXAM: CT ABDOMEN AND PELVIS WITHOUT CONTRAST TECHNIQUE: Multidetector CT imaging of the abdomen and pelvis was performed following the standard protocol without IV contrast. COMPARISON:  August 19, 2019 FINDINGS: Lower chest: Moderate RIGHT small LEFT pleural effusion. Basilar atelectasis. Patulous distal esophagus is filled with ingested contrast. No pericardial effusion. Heart is incompletely imaged. Hepatobiliary: Evidence of hepatic cirrhosis. Cholelithiasis and or sludge in the gallbladder lumen without pericholecystic stranding. Pancreas: Pancreas is atrophic without ductal dilation or inflammation. Spleen: Spleen mildly enlarged. Adrenals/Urinary Tract: Adrenal glands are normal. Bilateral perinephric stranding in the setting of mild generalized edema. Renal cortical scarring. No hydronephrosis. No nephrolithiasis. Urinary bladder with small amount of gas in the anti dependent portion. Bladder is under distended. Clear fat plane between the urinary bladder in the adjacent bowel. No signs  of bladder wall thickening to indicate fistula. Stomach/Bowel: Marked stool in the colon throughout the colon well-formed stool as it transitions more distally, partially liquid stool seen proximally. No pericolonic stranding. Small bowel partially filled with ingested contrast material. Moderately dilated in the mid small bowel up to approximately 3 cm dilated loops are centralized with transition, gradual transition at the distal jejunum. Some dilute contrast distal to the site of transition. No perienteric stranding. Transition occurring in the LEFT mid abdomen on images 43-50. Vascular/Lymphatic: Scattered calcified atheromatous plaque of the abdominal aorta. No aneurysmal dilation. No adenopathy. Reproductive: Post hysterectomy.  No adnexal mass. Other: No ascites.  No free air. Musculoskeletal: Sacral nerve stimulator in place passing through the S2 neural foramen on the LEFT. Power pack over RIGHT gluteal region. No acute musculoskeletal process. Spinalw degenerative changes. IMPRESSION: 1. Developing ileus versus partial small bowel obstruction in the setting of marked constipation. Dilated small bowel loops are however not distal small  bowel loops but are proximal to mid small bowel loops. 2. Moderate RIGHT small LEFT pleural effusion. 3. Patulous esophagus may indicate dysmotility are marked reflux. 4. Bilateral perinephric stranding, nonspecific in the setting of developing anasarca. Correlate with urine studies to exclude the possibility of urinary tract infection. 5. Cirrhosis and portal hypertension as before. 6. Cholelithiasis and or sludge in the gallbladder lumen without pericholecystic stranding. 7. Mild anasarca with body wall edema and small effusions. Trace ascites. 8. Aortic atherosclerosis. Aortic Atherosclerosis (ICD10-I70.0). Electronically Signed   By: Zetta Bills M.D.   On: 05/08/2020 19:12    Scheduled Meds: . sodium chloride   Intravenous Once  . atorvastatin  20 mg Oral Daily    . Chlorhexidine Gluconate Cloth  6 each Topical Daily  . docusate sodium  100 mg Oral BID  . gabapentin  100 mg Oral TID  . insulin aspart  0-9 Units Subcutaneous TID WC  . lactulose  20 g Oral BID  . metoprolol tartrate  25 mg Oral BID  . pantoprazole (PROTONIX) IV  40 mg Intravenous Q12H  . QUEtiapine  300 mg Oral QHS  . rifaximin  550 mg Oral BID  . sevelamer carbonate  1,600 mg Oral TID WC  . torsemide  20 mg Oral Once per day on Mon Wed Fri  . venlafaxine XR  75 mg Oral Q breakfast   Continuous Infusions: . sodium chloride 100 mL/hr at 05/08/20 1401     LOS: 7 days   Time spent: 28 minutes   Darliss Cheney, MD [facility]  05/10/2020, 12:43 PM   To contact the attending provider between 7A-7P or the covering provider during after hours 7P-7A, please log into the web site [email or web address].

## 2020-05-10 NOTE — Progress Notes (Signed)
Physical Therapy Treatment Patient Details Name: Tina Ramos MRN: [id number] DOB: 1956-02-05 Today's Date: 05/10/2020    History of Present Illness This is a 64 year old female with a history of cirrhosis, diabetes, hyperlipidemia who presents with left arm pain.  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 D/C'd home. Back to ED on 8/16 suffering a fall while she was in her kitchen.  She states she was at the refrigerator and started to fall hitting the drywall with her head and right shoulder then sliding down onto her butt.Reports 4-5 falls per day since 8/9. WU:[id number] acute bilateral subdural hematomas which extend along the tentorium, right greater than left.  She has nondisplaced bilateral nasal bone fractures which will likely not require any ENT intervention.Imaging of left UE fracture showing increased displacement.  There is concern for possible compartment syndrome since the left upper extremity is extremely swollen and patient with complaints of severe pain and paresthesias.  Pt underwent IM nail to L humerous on 8/18.    PT Comments    Patient progressing with ambulation this session, though easily fatigued and needing heavy HHA for balance.  She was able to tolerate with VSS on RA and without increased c/o pain.  Sling needing adjustment and pt with BM needing assist for hygiene.  Remains appropriate for SNF level rehab at d/c.   Follow Up Recommendations  SNF;Supervision/Assistance - 24 hour     Equipment Recommendations  Other (comment) (TBA)    Recommendations for Other Services       Precautions / Restrictions Precautions Precautions: Fall;Shoulder Type of Shoulder Precautions: AROM to L fingers, wrist and elbow only.  No active ROM or movement to the L shoulder for next two weeks.  Shoulder Interventions: Shoulder sling/immobilizer;At all times;Off for  dressing/bathing/exercises Required Braces or Orthoses: Sling Restrictions LUE Weight Bearing: Non weight bearing    Mobility  Bed Mobility Overal bed mobility: Needs Assistance Bed Mobility: Supine to Sit Rolling: Supervision   Supine to sit: Mod assist     General bed mobility comments: heavy lifting assist for trunk, able to bring legs off bed; rolled with S for hygiene and to change brief as soiled with BM  Transfers Overall transfer level: Needs assistance Equipment used: 1 person hand held assist Transfers: Sit to/from Stand Sit to Stand: Mod assist         General transfer comment: some lifting help with 1 HHA only  Ambulation/Gait Ambulation/Gait assistance: Mod assist;Min assist;+2 safety/equipment Gait Distance (Feet): 100 Feet Assistive device: 1 person hand held assist Gait Pattern/deviations: Step-through pattern;Drifts right/left     General Gait Details: assist for balance as slightly posterior, assist for direction changes and due to generalized weakness.   Stairs             Wheelchair Mobility    Modified Rankin (Stroke Patients Only)       Balance Overall balance assessment: Needs assistance Sitting-balance support: Feet supported Sitting balance-Leahy Scale: Fair Sitting balance - Comments: sitting with S but with posterior pelvic tilt and weak core   Standing balance support: Single extremity supported Standing balance-Leahy Scale: Poor Standing balance comment: reliant on external support                            Cognition Arousal/Alertness: Awake/alert Behavior During Therapy: Flat affect Overall Cognitive Status: Within Functional  Limits for tasks assessed                                 General Comments: stated she had just had BM and needed help to clean up, slower processing and noting need for A with functional tasks      Exercises      General Comments General comments (skin integrity,  edema, etc.): VSS with activity on RA      Pertinent Vitals/Pain Pain Assessment: Faces Faces Pain Scale: Hurts even more Pain Location: LUE Pain Descriptors / Indicators: Grimacing;Guarding Pain Intervention(s): Monitored during session;Repositioned    Home Living                      Prior Function            PT Goals (current goals can now be found in the care plan section) Progress towards PT goals: Progressing toward goals    Frequency    Min 3X/week      PT Plan Current plan remains appropriate    Co-evaluation              AM-PAC PT "6 Clicks" Mobility   Outcome Measure  Help needed turning from your back to your side while in a flat bed without using bedrails?: A Little Help needed moving from lying on your back to sitting on the side of a flat bed without using bedrails?: A Lot Help needed moving to and from a bed to a chair (including a wheelchair)?: A Little Help needed standing up from a chair using your arms (e.g., wheelchair or bedside chair)?: A Lot Help needed to walk in hospital room?: A Lot Help needed climbing 3-5 steps with a railing? : Total 6 Click Score: 13    End of Session Equipment Utilized During Treatment: Gait belt;Other (comment) (sling) Activity Tolerance: Patient tolerated treatment well Patient left: in chair;with call bell/phone within reach;with chair alarm set   PT Visit Diagnosis: Unsteadiness on feet (R26.81);Repeated falls (R29.6);Pain Pain - Right/Left: Left Pain - part of body: Arm     Time: 1102-1130 PT Time Calculation (min) (ACUTE ONLY): 28 min  Charges:  $Gait Training: 8-22 mins $Therapeutic Activity: 8-22 mins                     Magda Kiel, PT Acute Rehabilitation Services FWYOV:[phone] [phone] 05/10/2020    [name] 05/10/2020, 12:11 PM

## 2020-05-10 NOTE — TOC Progression Note (Signed)
Transition of Care [facility]) - Progression Note    Patient Details  Name: Tina Ramos MRN: [id number] Date of Birth: 12/31/1955  Transition of Care [facility]) CM/SW [place], [place] Phone Number: 05/10/2020, 3:09 PM  Clinical Narrative:     CSW visit with patient at bedside. CSW introduced self and explained role. Patient remains agreeable to SNF placement. CSW informed patient of bed offer. Patient accepted bed offer with [facility]. Patient has received vaccines.   CSW called [name] to confirmed bed offer- left voice message, waiting on response.   Patient's PSARR # is pending - must have number before patient can discharge to SNF.  Thurmond Butts, MSW, [place] Clinical Social Worker   Expected Discharge Plan: Skilled Nursing Facility Barriers to Discharge: Continued Medical Work up  Expected Discharge Plan and Services Expected Discharge Plan: [place] arrangements for the past 2 months: Single Family Home                                       Social Determinants of Health (SDOH) Interventions    Readmission Risk Interventions No flowsheet data found.

## 2020-05-10 NOTE — Plan of Care (Signed)

## 2020-05-10 NOTE — Progress Notes (Signed)
Subjective: Reports lots of stool and flatus output. No abdominal pain.  Objective: Vital signs in last 24 hours: Temp:  [98.4 F (36.9 C)-102.3 F (39.1 C)] 98.7 F (37.1 C) (08/24 0732) Pulse Rate:  [82-106] 82 (08/24 0732) Resp:  [14-20] 20 (08/24 0732) BP: (128-172)/(46-60) 128/51 (08/24 0732) SpO2:  [97 %-100 %] 100 % (08/24 0732) Weight change:  Last BM Date: 05/09/20  PE: GEN:  NAD ABD:  Soft, non-tender  Lab Results: CBC    Component Value Date/Time   WBC 11.6 (H) 05/10/2020 0614   RBC 2.68 (L) 05/10/2020 0614   HGB 8.7 (L) 05/10/2020 0614   HCT 27.9 (L) 05/10/2020 0614   PLT 248 05/10/2020 0614   MCV 104.1 (H) 05/10/2020 0614   MCH 32.5 05/10/2020 0614   MCHC 31.2 05/10/2020 0614   RDW 17.0 (H) 05/10/2020 0614   LYMPHSABS 1.1 05/10/2020 0614   MONOABS 1.2 (H) 05/10/2020 0614   EOSABS 0.2 05/10/2020 0614   BASOSABS 0.0 05/10/2020 0614   CMP     Component Value Date/Time   NA 142 05/10/2020 0614   K 5.2 (H) 05/10/2020 0614   CL 112 (H) 05/10/2020 0614   CO2 20 (L) 05/10/2020 0614   GLUCOSE 125 (H) 05/10/2020 0614   BUN 29 (H) 05/10/2020 0614   CREATININE 1.98 (H) 05/10/2020 0614   CALCIUM 8.3 (L) 05/10/2020 0614   PROT 6.7 05/02/2020 1908   ALBUMIN 3.6 05/02/2020 1908   AST 29 05/02/2020 1908   ALT 17 05/02/2020 1908   ALKPHOS 60 05/02/2020 1908   BILITOT 0.7 05/02/2020 1908   GFRNONAA 26 (L) 05/10/2020 0614   GFRAA 30 (L) 05/10/2020 4970   Assessment:  1.  Constipation, obstipation.  Improving. 2.  Hematemesis, resolved, unrevealing endoscopy.  Plan:  1.  Continue bowel regimen. 2.  Ok to advance diet. 3.  PPI, serial CBCs. 4.  [facility] will follow.   Landry Dyke 05/10/2020, 10:28 AM   Cell [phone] If no answer or after 5 PM call [phone]

## 2020-05-11 ENCOUNTER — Inpatient Hospital Stay (HOSPITAL_COMMUNITY): Payer: Medicare Other

## 2020-05-11 LAB — CBC WITH DIFFERENTIAL/PLATELET
Abs Immature Granulocytes: 0.16 10*3/uL — ABNORMAL HIGH (ref 0.00–0.07)
Basophils Absolute: 0.1 10*3/uL (ref 0.0–0.1)
Basophils Relative: 1 %
Eosinophils Absolute: 0.4 10*3/uL (ref 0.0–0.5)
Eosinophils Relative: 4 %
HCT: 26.1 % — ABNORMAL LOW (ref 36.0–46.0)
Hemoglobin: 8.1 g/dL — ABNORMAL LOW (ref 12.0–15.0)
Immature Granulocytes: 2 %
Lymphocytes Relative: 11 %
Lymphs Abs: 1.1 10*3/uL (ref 0.7–4.0)
MCH: 33.2 pg (ref 26.0–34.0)
MCHC: 31 g/dL (ref 30.0–36.0)
MCV: 107 fL — ABNORMAL HIGH (ref 80.0–100.0)
Monocytes Absolute: 0.9 10*3/uL (ref 0.1–1.0)
Monocytes Relative: 9 %
Neutro Abs: 7.5 10*3/uL (ref 1.7–7.7)
Neutrophils Relative %: 73 %
Platelets: 219 10*3/uL (ref 150–400)
RBC: 2.44 MIL/uL — ABNORMAL LOW (ref 3.87–5.11)
RDW: 16.2 % — ABNORMAL HIGH (ref 11.5–15.5)
WBC: 10.1 10*3/uL (ref 4.0–10.5)
nRBC: 0.2 % (ref 0.0–0.2)

## 2020-05-11 LAB — BASIC METABOLIC PANEL
Anion gap: 10 (ref 5–15)
BUN: 24 mg/dL — ABNORMAL HIGH (ref 8–23)
CO2: 21 mmol/L — ABNORMAL LOW (ref 22–32)
Calcium: 8 mg/dL — ABNORMAL LOW (ref 8.9–10.3)
Chloride: 108 mmol/L (ref 98–111)
Creatinine, Ser: 1.84 mg/dL — ABNORMAL HIGH (ref 0.44–1.00)
GFR calc Af Amer: 33 mL/min — ABNORMAL LOW (ref >60)
GFR calc non Af Amer: 28 mL/min — ABNORMAL LOW (ref >60)
Glucose, Bld: 170 mg/dL — ABNORMAL HIGH (ref 70–99)
Potassium: 4.7 mmol/L (ref 3.5–5.1)
Sodium: 139 mmol/L (ref 135–145)

## 2020-05-11 LAB — GLUCOSE, CAPILLARY
Glucose-Capillary: 99 mg/dL (ref 70–99)
Glucose-Capillary: 105 mg/dL — ABNORMAL HIGH (ref 70–99)
Glucose-Capillary: 179 mg/dL — ABNORMAL HIGH (ref 70–99)
Glucose-Capillary: 120 mg/dL — ABNORMAL HIGH (ref 70–99)
Glucose-Capillary: 128 mg/dL — ABNORMAL HIGH (ref 70–99)

## 2020-05-11 MED ORDER — SORBITOL 70 % SOLN
960.0000 mL | TOPICAL_OIL | Freq: Once | ORAL | Status: AC
  Administered 2020-05-11: 960 mL via RECTAL
  Filled 2020-05-11: qty 473

## 2020-05-11 NOTE — TOC Progression Note (Signed)
Transition of Care [facility]) - Progression Note    Patient Details  Name: Tina Ramos MRN: [id number] Date of Birth: 04/01/1956  Transition of Care [facility]) CM/SW [place], [place] Phone Number: 05/11/2020, 1:30 PM  Clinical Narrative:     [name] confirmed bed offer. SNF will start insurance authorization.   Patient will need covid test 24-48 hrs prior to discharge. Patient will need insurance authorization.  CSW will continue to follow and assist with discharge planning.  Thurmond Butts, MSW, [place] Clinical Social Worker   Expected Discharge Plan: Skilled Nursing Facility Barriers to Discharge: Continued Medical Work up  Expected Discharge Plan and Services Expected Discharge Plan: [place] arrangements for the past 2 months: Single Family Home                                       Social Determinants of Health (SDOH) Interventions    Readmission Risk Interventions No flowsheet data found.

## 2020-05-11 NOTE — Progress Notes (Signed)
   ORTHOPAEDIC PROGRESS NOTE  s/p Procedure(s): LEFT INTRAMEDULLARY (IM) NAIL HUMERAL on 8/18/20021 with Dr. Griffin Basil  SUBJECTIVE: Sitting up in hospital bed. No complaints.   OBJECTIVE: PE:  General:alert & oriented, no acute distress Left upper extremity: Dressings changed - incisions CDI. Improvement of swelling and bruising of left arm - seems to be improving. Compartments compressible. She endorses axillary nerve sensation. + Motor in  AIN, PIN, Ulnar distributions.  Sensation intact in medial, radial, and ulnar distributions. Well perfused digits.   Vitals:   05/11/20 0750 05/11/20 1150  BP: (!) 153/55 (!) 134/47  Pulse: 89 88  Resp: 20 (!) 21  Temp: 99.5 F (37.5 C) 100.1 F (37.8 C)  SpO2: 100% 98%    ASSESSMENT: Tina Ramos is a 64 y.o. female status post IMN left humerus. POD#7  Stable post-op imaging.   PLAN: Weightbearing: NWB LUE   - Pendulums start week 2   - Active assisted ROM starting week 3 Insicional and dressing care: Reinforce dressings as needed Dressings changed. Mepilex dressings applied. Okay to remove in 2-3 days. Leave steri-strips in place or re-apply new steri-strips.  Orthopedic device(s): Sling for 6 weeks Showering: Post-op day #2 with assistance. Keep incisions CDI.  VTE prophylaxis: SCDs. Ambulation. Due to patient's history of bilateral subdural hematomas and CKD stage IV we will defer to medicine for further DVT prophylaxis. No orthopedic contraindications.  Pain control: PRN pain medications. Preferring oral medications Follow - up plan: 3 weeks in office for incision check and xray Contact information: Dr. Ophelia Charter, Noemi Chapel, PA-C  Dispo: TBD. PT/OT recommending SNF. Patient agreeable to SNF. Printed and placed prescription for pain medications for discharge. Cleared for discharge from orthopedics standpoint once cleared by medicine team and therapies.    Noemi Chapel, PA-C 05/11/2020

## 2020-05-11 NOTE — Discharge Instructions (Signed)
Ophelia Charter MD, MPH Noemi Chapel, PA-C [place] [address] [phone] (fax)   POST-OPERATIVE INSTRUCTIONS   WOUND CARE ? You may remove dressing in 3 days ? Keep steri-strips in place until they fall off on their own ? If steri-strips fall off when bandage is removed, re-apply new steri-strips ? KEEP THE INCISIONS CLEAN AND DRY. ? Use the provided ice machine or Ice packs as often as you would like for pain relief.   ? Keep a layer of cloth or a shirt between your skin and the cooling unit to prevent frost bite as it can get very cold.  SHOWERING: - You may remove the sling for showering, but keep a water resistant pillow under the arm to keep both the  elbow and shoulder away from the body (mimicking the abduction sling).  - Gently pat the area dry.  - Do not soak the shoulder in water. Do not go swimming in the pool or ocean until your sutures are removed. - KEEP THE INCISIONS CLEAN AND DRY.  EXERCISES ? Wear the sling at all times except when doing your exercises.  ? You may remove the sling for showering, but keep the arm across the chest or in a secondary sling.    ? Do not lift anything heavier than 1 pound until we discuss it further in clinic. ? Please perform the exercises:    Elbow / Hand / Wrist  Range of Motion Exercises  Grip strengthening   REGIONAL ANESTHESIA (NERVE BLOCKS)  The anesthesia team may have performed a nerve block for you if safe in the setting of your care.  This is a great tool used to minimize pain.  Typically the block may start wearing off overnight but the long acting medicine may last for 3-4 days.  The nerve block wearing off can be a challenging period but please utilize your as needed pain medications to try and manage this period.    FOLLOW-UP ? If you develop a Fever (>101.5), Redness or Drainage from the surgical incision site, please call our office to arrange for an  evaluation. ? Please call the office to schedule a follow-up appointment for a wound check, 7-10 days post-operatively.  IF YOU HAVE ANY QUESTIONS, PLEASE FEEL FREE TO CALL OUR OFFICE.  HELPFUL INFORMATION   If you had a block, it will wear off between 8-24 hrs postop typically.  This is period when your pain may go from nearly zero to the pain you would have had post-op without the block.  This is an abrupt transition but nothing dangerous is happening.  You may take an extra dose of narcotic when this happens.  ? Your arm will be in a sling following surgery. You will be in this sling for the next 6 weeks.  I will let you know the exact duration at your follow-up visit.  ? You may be more comfortable sleeping in a semi-seated position the first few nights following surgery.  Keep a pillow propped under the elbow and forearm for comfort.  If you have a recliner type of chair it might be beneficial.  If not that is fine too, but it would be helpful to sleep propped up with pillows behind your operated shoulder as well under your elbow and forearm.  This will reduce pulling on the suture lines.  ? When dressing, put your operative arm in the sleeve first.  When getting undressed, take your  operative arm out last.  Loose fitting, button-down shirts are recommended.  ? In most states it is against the law to drive while your arm is in a sling. And certainly against the law to drive while taking narcotics.  ? You may return to work/school in the next couple of days when you feel up to it. Desk work and typing in the sling is fine.  ? We suggest you use the pain medication the first night prior to going to bed, in order to ease any pain when the anesthesia wears off. You should avoid taking pain medications on an empty stomach as it will make you nauseous.  ? Do not drink alcoholic beverages or take illicit drugs when taking pain medications.  ? Pain medication may make you constipated.  Below are  a few solutions to try in this order: - Decrease the amount of pain medication if you arent having pain. - Drink lots of decaffeinated fluids. - Drink prune juice and/or each dried prunes  o If the first 3 dont work start with additional solutions - Take Colace - an over-the-counter stool softener - Take Senokot - an over-the-counter laxative - Take Miralax - a stronger over-the-counter laxative

## 2020-05-11 NOTE — Progress Notes (Signed)
PROGRESS NOTE    Tina Ramos  LPF:[id number] DOB: 1956-08-24 DOA: 05/02/2020 PCP: [facility]   Brief Narrative:  Tina Ramos is a 63 y.o. female with medical history significant of liver cirrhosis, insulin-dependent type 2 diabetes, hyperlipidemia, gastroparesis, CKD stage IV presented to the ED after sustaining head injury from a fall.  Patient was recently evaluated in the ED on 8/9 after a fall and was diagnosed with a mildly displaced spiral fracture of the proximal diaphysis of the left humerus. she was not able to see orthopedics yet.   Complained of extremely swollen and painful left upper extremity associated with numbness and tingling in her fingers in the same extremity. she had multiple falls in the past 2 weeks.  Each time she describes it as just falling on the ground all of a sudden. Denied prodromal symptoms or any history of seizure. her 62 year old father takes care of her. She takes a baby aspirin daily.  Upon arrival to ED she was a stable with COVID-19 negative. Head CT showing acute bilateral subdural hematomas which extend along the tentorium, right greater than left.  Right posterior parietal scalp laceration and hematoma.  CT maxillofacial showing nondisplaced bilateral nasal bone fractures. CT C-spine negative for acute abnormality. X-ray of right shoulder negative for acute fracture or dislocation. Chest x-ray showing no acute intrathoracic process.    Extremity x-ray shows known displaced left humeral diaphyseal fracture.  ED provider discussed the case with Dr. Trenton Gammon from neurosurgery who felt that the subdural hematomas were small.  He recommended neurochecks and potential discharge from the ED. Admission requested as it was not safe for the patient to go home given frequency of falls.  Scalp laceration was repaired in the ED. Foley catheter was placed as patient does self-catheterization at home. Patient received fentanyl, IV fluid, and Tdap  booster.  Admitted under hospitalist service.  Orthopedics were consulted to see her on urgent basis for concern of possible compartment syndrome in the left upper extremity.  They ruled out compartment syndrome.  Subsequently she underwent  left humeral shaft intramedullary nail fixation 05/04/2020.  Repeat head CT showed stable subdural hematomas. she had one episode of vomiting which was thought to be black on late morning of 05/04/2020.  Patient denied having any history of GI bleed in the past but she has history of GERD for which she takes Protonix.  Hemoglobin was repeated which was stable.  For some reason, sample was not taken for occult blood.  Subsequently over the course of next 2 days, her hemoglobin dropped to 6.4 on 05/06/2020 and she received 1 unit of PRBC transfusion and GI was consulted and she underwent EGD on 05/07/2020 which was negative for any acute bleeding or any pathology   Assessment & Plan:   Principal Problem:   Subdural hematoma (HCC) Active Problems:   Humerus fracture   Scalp laceration   Nasal bone fractures   Falls frequently   Ileus (HCC)   SBO (small bowel obstruction) (HCC)   Acute bilateral subdural hematomas secondary to a fall: Patient takes aspirin 81 mg daily at home.  Head CT showing acute bilateral subdural hematomas which extend along the tentorium, right greater than left.  ED provider discussed the case with neurosurgery and and it was felt that the hematomas were small and neurosurgery in fact recommended discharging patient home however due to frequent falls, she was admitted under hospital service.  There is no consultation note from neurosurgery.  Repeat CT  head stable.   Displaced spiral fracture of the proximal diaphysis of the left humerus: Diagnosed after a fall on 8/9.  Imaging done this hospitalization showing increased displacement. Now status post left humeral shaft intramedullary nail fixation 05/04/2020.  Scalp laceration -Repaired in  the ED. Tdap booster was given.  Nondisplaced bilateral nasal bone fractures: Seen on CT. ENT follow-up as outpatient.  Frequent falls: Etiology unknown.  Likely due to deconditioning or vasovagal.  No event on telemetry noted so far.  Echo with normal ejection fraction and no wall motion abnormality and no valvular heart disease.  EEG negative for any epileptiform discharges.  Seen by PT OT and they recommend SNF.  Patient agreeable to go to SNF as well.  Acute on chronic anemia/hematemesis: Hemoglobin 7.7, MCV 103.8.  Hemoglobin was 10.4 on labs done in March 2021.  Patient is not endorsing any symptoms of GI bleed but she tells me that she bruises easily because it " a woman's skin".  Does have significant ecchymosis to her chest wall and left upper extremity from her recent fall which could possibly be contributing to the dropping hemoglobin to some degree.  Reportedly, she had one episode of vomiting which was black on late morning of 05/04/2020.  Patient denies having any history of GI bleed in the past but she has history of GERD for which she takes Protonix.  Subsequently her hemoglobin dropped to 6.4 on 05/06/2020.  She received 1 unit of PRBC transfusion.  GI was consulted.  She was started on octreotide and underwent EGD twhich is unremarkable so octreotide was stopped.  Hemoglobin is stable.  Hyperlipidemia: Holding atorvastatin for now.  Essential hypertension: Blood pressure Now controlled. -Continue hydralazine PRN SBP >170.   Sinus tachycardia/fever: Patient's tachycardia has improved. Per chart, patient had fever of 102.3 at 8 PM on 05/09/2020.  Patient does not recall that.  She has no symptoms currently.Has mild leukocytosis but she has remained afebrile since then.  Lactic acid normal.  We will watch for now.  Fever/ileus/small bowel obstruction: Afebrile for more than 48 hours now.  No more nausea or vomiting.  X-ray and CT abdomen indicates ileus/small bowel obstruction.  GI  consulted general surgery who did not think patient needs any surgical intervention.  They recommended continuing supportive care.  Patient is feeling better today.  No nausea or vomiting.  Has been having bowel movements.  Tolerating clear liquid diet.  Will advance to full liquid diet and then to soft diet as tolerated.   Insulin-dependent type 2 diabetes: Hemoglobin A1c 6.0.  Controlled blood sugar now.  Continue SSI.  Mood disorder: -Resume home medications  CKD stage IV: Renal function at baseline. -Resume home Renvela.  Hyperkalemia: 5.2 yesterday.  Waiting for BMP this morning.  Orders placed yesterday.  Labs still not done.   DVT prophylaxis: SCDs Start: 05/04/20 1640 SCDs Start: 05/03/20 0325   Code Status: Full Code  Family Communication: None present at bedside.  Plan of care discussed with patient in length and she verbalized understanding and agreed with it.  Status is: Inpatient  Remains inpatient appropriate because:Inpatient level of care appropriate due to severity of illness   Dispo: The patient is from: Home              Anticipated d/c is to: SNF              Anticipated d/c date is: 1 to 2 days  Patient currently is not medically stable to d/c.     Estimated body mass index is 25.06 kg/m as calculated from the following:   Height as of 04/25/20: 5\' 2"  (1.575 m).   Weight as of 04/25/20: 62.1 kg.      Nutritional status:               Consultants:   Orthopedics  GI  General surgery  Procedures:  left humeral shaft intramedullary nail fixation 05/04/2020. EGD 05/07/2020  Antimicrobials:  Anti-infectives (From admission, onward)   Start     Dose/Rate Route Frequency Ordered Stop   05/06/20 1800  rifaximin (XIFAXAN) tablet 550 mg        550 mg Oral 2 times daily 05/06/20 1538     05/04/20 1700  ceFAZolin (ANCEF) IVPB 1 g/50 mL premix        1 g 100 mL/hr over 30 Minutes Intravenous Every 6 hours 05/04/20 1639 05/05/20  0514   05/04/20 1349  vancomycin (VANCOCIN) powder  Status:  Discontinued          As needed 05/04/20 1349 05/04/20 1520   05/04/20 1314  ceFAZolin (ANCEF) 2-4 GM/100ML-% IVPB  Status:  Discontinued       Note to Pharmacy: Laurita Quint   : cabinet override      05/04/20 1314 05/04/20 1532   05/04/20 1000  ceFAZolin (ANCEF) IVPB 2g/100 mL premix        2 g 200 mL/hr over 30 Minutes Intravenous  Once 05/03/20 1316 05/04/20 0949         Subjective: Seen and examined.  She has no complaints.  No nausea.  No abdominal pain.  Passing gas and having bowel movements.  She has had 2 bowel movements in last 24 hours according to her.  Objective: Vitals:   05/11/20 0000 05/11/20 0208 05/11/20 0323 05/11/20 0750  BP: 115/66   (!) 153/55  Pulse: 78 84  89  Resp: (!) 21 (!) 21 (!) 22 20  Temp: 98.2 F (36.8 C)  98 F (36.7 C) 99.5 F (37.5 C)  TempSrc: Oral  Oral Oral  SpO2: 95% 94%  100%    Intake/Output Summary (Last 24 hours) at 05/11/2020 1058 Last data filed at 05/11/2020 0930 Gross per 24 hour  Intake 600 ml  Output 1235 ml  Net -635 ml   There were no vitals filed for this visit.  Examination:  General exam: Appears calm and comfortable  Respiratory system: Clear to auscultation. Respiratory effort normal. Cardiovascular system: S1 & S2 heard, RRR. No JVD, murmurs, rubs, gallops or clicks. No pedal edema. Gastrointestinal system: Abdomen is nondistended, soft and nontender. No organomegaly or masses felt. Normal bowel sounds heard. Central nervous system: Alert and oriented. No focal neurological deficits. Extremities: Sling in left upper extremity Skin: No rashes, lesions or ulcers.  Psychiatry: Judgement and insight appear normal. Mood & affect appropriate.   Data Reviewed: I have personally reviewed following labs and imaging studies  CBC: Recent Labs  Lab 05/05/20 1837 05/05/20 1837 05/06/20 0601 05/06/20 0601 05/06/20 1855 05/07/20 1649 05/08/20 0828  05/09/20 0653 05/10/20 0614  WBC 11.5*   < > 7.9   < > 9.9 10.6* 6.1 6.5 11.6*  NEUTROABS 7.4  --  5.2  --  5.7  --   --  4.1 9.1*  HGB 7.0*   < > 6.4*   < > 9.0* 9.7* 8.5* 7.8* 8.7*  HCT 22.9*   < > 21.4*   < >  27.3* 29.5* 27.6* 26.1* 27.9*  MCV 108.0*   < > 109.2*   < > 101.9* 100.0 104.2* 106.1* 104.1*  PLT 201   < > 154   < > 175 194 181 183 248   < > = values in this interval not displayed.   Basic Metabolic Panel: Recent Labs  Lab 05/05/20 0836 05/05/20 0836 05/06/20 0601 05/07/20 0514 05/08/20 0828 05/09/20 0653 05/09/20 1019 05/10/20 0614  NA 141   < > 135 136 140 140  --  142  K 4.7   < > 4.8 5.3* 4.8 5.4*  --  5.2*  CL 109   < > 108 105 109 110  --  112*  CO2 19*   < > 18* 22 21* 20*  --  20*  GLUCOSE 130*   < > 133* 156* 132* 118*  --  125*  BUN 28*   < > 25* 28* 28* 30*  --  29*  CREATININE 2.39*   < > 2.32* 2.61* 2.24* 2.07*  --  1.98*  CALCIUM 7.7*   < > 7.5* 7.9* 7.8* 7.7*  --  8.3*  MG 2.0  --   --   --   --   --  2.1  --    < > = values in this interval not displayed.   GFR: CrCl cannot be calculated (Unknown ideal weight.). Liver Function Tests: No results for input(s): AST, ALT, ALKPHOS, BILITOT, PROT, ALBUMIN in the last 168 hours. No results for input(s): LIPASE, AMYLASE in the last 168 hours. No results for input(s): AMMONIA in the last 168 hours. Coagulation Profile: Recent Labs  Lab 05/07/20 0514  INR 1.7*   Cardiac Enzymes: No results for input(s): CKTOTAL, CKMB, CKMBINDEX, TROPONINI in the last 168 hours. BNP (last 3 results) No results for input(s): PROBNP in the last 8760 hours. HbA1C: No results for input(s): HGBA1C in the last 72 hours. CBG: Recent Labs  Lab 05/10/20 1643 05/10/20 1934 05/10/20 2348 05/11/20 0328 05/11/20 0752  GLUCAP 161* 144* 112* 99 105*   Lipid Profile: No results for input(s): CHOL, HDL, LDLCALC, TRIG, CHOLHDL, LDLDIRECT in the last 72 hours. Thyroid Function Tests: No results for input(s): TSH,  T4TOTAL, FREET4, T3FREE, THYROIDAB in the last 72 hours. Anemia Panel: No results for input(s): VITAMINB12, FOLATE, FERRITIN, TIBC, IRON, RETICCTPCT in the last 72 hours. Sepsis Labs: Recent Labs  Lab 05/08/20 0828 05/08/20 0828 05/10/20 0815 05/10/20 1159 05/10/20 1627 05/10/20 1912  PROCALCITON 0.32  --   --   --   --   --   LATICACIDVEN 1.0   < > 1.3 2.1* 1.9 1.7   < > = values in this interval not displayed.    Recent Results (from the past 240 hour(s))  SARS Coronavirus 2 by RT PCR (hospital order, performed in [facility] hospital lab) Nasopharyngeal Nasopharyngeal Swab     Status: None   Collection Time: 05/02/20  7:28 PM   Specimen: Nasopharyngeal Swab  Result Value Ref Range Status   SARS Coronavirus 2 NEGATIVE NEGATIVE Final    Comment: (NOTE) SARS-CoV-2 target nucleic acids are NOT DETECTED.  The SARS-CoV-2 RNA is generally detectable in upper and lower respiratory specimens during the acute phase of infection. The lowest concentration of SARS-CoV-2 viral copies this assay can detect is 250 copies / mL. A negative result does not preclude SARS-CoV-2 infection and should not be used as the sole basis for treatment or other patient management decisions.  A negative result  may occur with improper specimen collection / handling, submission of specimen other than nasopharyngeal swab, presence of viral mutation(s) within the areas targeted by this assay, and inadequate number of viral copies (<250 copies / mL). A negative result must be combined with clinical observations, patient history, and epidemiological information.  Fact Sheet for Patients:   StrictlyIdeas.no  Fact Sheet for Healthcare Providers: BankingDealers.co.za  This test is not yet approved or  cleared by the [place] FDA and has been authorized for detection and/or diagnosis of SARS-CoV-2 by FDA under an Emergency Use Authorization (EUA).  This EUA  will remain in effect (meaning this test can be used) for the duration of the COVID-19 declaration under Section 564(b)(1) of the Act, 21 U.S.C. section 360bbb-3(b)(1), unless the authorization is terminated or revoked sooner.  Performed at [facility], [address] [place], [place] [address]   MRSA PCR Screening     Status: None   Collection Time: 05/02/20 11:40 PM   Specimen: Nasal Mucosa; Nasopharyngeal  Result Value Ref Range Status   MRSA by PCR NEGATIVE NEGATIVE Final    Comment:        The GeneXpert MRSA Assay (FDA approved for NASAL specimens only), is one component of a comprehensive MRSA colonization surveillance program. It is not intended to diagnose MRSA infection nor to guide or monitor treatment for MRSA infections. Performed at [facility], [place] [address], [place], [place] [address]       Radiology Studies: No results found.  Scheduled Meds: . sodium chloride   Intravenous Once  . atorvastatin  20 mg Oral Daily  . Chlorhexidine Gluconate Cloth  6 each Topical Daily  . docusate sodium  100 mg Oral BID  . gabapentin  100 mg Oral TID  . insulin aspart  0-9 Units Subcutaneous TID WC  . lactulose  20 g Oral BID  . metoprolol tartrate  25 mg Oral BID  . pantoprazole (PROTONIX) IV  40 mg Intravenous Q12H  . QUEtiapine  300 mg Oral QHS  . rifaximin  550 mg Oral BID  . sevelamer carbonate  1,600 mg Oral TID WC  . sorbitol, milk of mag, mineral oil, glycerin (SMOG) enema  960 mL Rectal Once  . torsemide  20 mg Oral Once per day on Mon Wed Fri  . venlafaxine XR  75 mg Oral Q breakfast   Continuous Infusions: . sodium chloride 100 mL/hr at 05/08/20 1401     LOS: 8 days   Time spent: 27 minutes   Darliss Cheney, MD [facility]  05/11/2020, 10:58 AM   To contact the attending provider between 7A-7P or the covering provider during after hours 7P-7A, please log into the web site [email or web address].

## 2020-05-11 NOTE — Progress Notes (Addendum)
[facility] Progress Note  Tina Ramos 64 y.o. [birth year]  CC: Coffee-ground emesis (resolved), ileus versus SBO  Subjective: Patient denies any further episodes of nausea or vomiting.  Started passing gas yesterday.  Tolerating clears.  Per flowsheet review, one bowel movement yesterday; one small type I brown bowel movement yesterday, otherwise no other bowel movements documented since 05/03/2020.  ROS : Review of Systems  Respiratory: Positive for shortness of breath.   Gastrointestinal: Positive for constipation. Negative for abdominal pain, blood in stool, diarrhea, heartburn, melena, nausea and vomiting.   Objective: Vital signs in last 24 hours: Vitals:   05/11/20 0323 05/11/20 0750  BP:  (!) 153/55  Pulse:  89  Resp: (!) 22 20  Temp: 98 F (36.7 C) 99.5 F (37.5 C)  SpO2:  100%    Physical Exam:  General:  Lethargic, oriented, cooperative, no acute distress  Head:  Normocephalic, without obvious abnormality, atraumatic  Eyes:  Conjunctival pallor, EOMs intact  Lungs:   Mild tachypnea; nasal cannula in place  Heart:  Regular rate and rhythm, S1, S2 normal  Abdomen:   Mildly distended, mild diffuse tenderness,sluggish bowel sounds though improving, no guarding or peritoneal signs   Extremities: Extremities normal, atraumatic, no  edema  Pulses: 2+ and symmetric    Lab Results: Recent Labs    05/09/20 0653 05/09/20 1019 05/10/20 0614  NA 140  --  142  K 5.4*  --  5.2*  CL 110  --  112*  CO2 20*  --  20*  GLUCOSE 118*  --  125*  BUN 30*  --  29*  CREATININE 2.07*  --  1.98*  CALCIUM 7.7*  --  8.3*  MG  --  2.1  --    No results for input(s): AST, ALT, ALKPHOS, BILITOT, PROT, ALBUMIN in the last 72 hours. Recent Labs    05/09/20 0653 05/10/20 0614  WBC 6.5 11.6*  NEUTROABS 4.1 9.1*  HGB 7.8* 8.7*  HCT 26.1* 27.9*  MCV 106.1* 104.1*  PLT 183 248   No results for input(s): LABPROT, INR in the last 72 hours.  Assessment: Ileus,  improving: CT 05/08/2020 showed Developing ileus versus partial small bowel obstruction in the setting of marked constipation. Dilated small bowel loops are however not distal small bowel loops but are proximal to mid small bowel loops.  Suspect ileus. -Potassium 5.2 as of 8/24 -Magnesium 8/23: 2.1; will repeat Mg today  Hematemesis, resolved -Hemoglobin improved to 8.7 from 7.8 yesterday.  No further signs of GI bleeding.  Recent subdural hematomas and humeral fracture s/p repair (secondary to falls)  Cirrhosis per CT and US imaging: compensated -Platelets 248 K/uL as of 8/25 -INR 1.7 as of 8/21 -LFTs normal as of 05/02/20  History of achalasia s/p POEM (08/2018)  DM type 2 with history of gastroparesis  CKD: BUN 29/ Cr 1.98  Plan: SMOG enema today.  Continue clear liquid diet for now.    Continue Protonix BID.  Continue maintain potassium >4-4.5.  Continue to maintain magnesium ~2.0.  Continue to monitor H&H with transfusion as needed to maintain Hgb >7.  [facility] will follow.  Salley Slaughter PA-C 05/11/2020, 10:12 AM  Contact #  [phone]

## 2020-05-12 ENCOUNTER — Inpatient Hospital Stay (HOSPITAL_COMMUNITY): Payer: Medicare Other

## 2020-05-12 LAB — CBC WITH DIFFERENTIAL/PLATELET
Abs Immature Granulocytes: 0.07 10*3/uL (ref 0.00–0.07)
Basophils Absolute: 0.1 10*3/uL (ref 0.0–0.1)
Basophils Relative: 1 %
Eosinophils Absolute: 0.5 10*3/uL (ref 0.0–0.5)
Eosinophils Relative: 6 %
HCT: 25.2 % — ABNORMAL LOW (ref 36.0–46.0)
Hemoglobin: 7.9 g/dL — ABNORMAL LOW (ref 12.0–15.0)
Immature Granulocytes: 1 %
Lymphocytes Relative: 21 %
Lymphs Abs: 1.9 10*3/uL (ref 0.7–4.0)
MCH: 33.1 pg (ref 26.0–34.0)
MCHC: 31.3 g/dL (ref 30.0–36.0)
MCV: 105.4 fL — ABNORMAL HIGH (ref 80.0–100.0)
Monocytes Absolute: 1 10*3/uL (ref 0.1–1.0)
Monocytes Relative: 12 %
Neutro Abs: 5.3 10*3/uL (ref 1.7–7.7)
Neutrophils Relative %: 59 %
Platelets: 211 10*3/uL (ref 150–400)
RBC: 2.39 MIL/uL — ABNORMAL LOW (ref 3.87–5.11)
RDW: 16.1 % — ABNORMAL HIGH (ref 11.5–15.5)
WBC: 8.8 10*3/uL (ref 4.0–10.5)
nRBC: 0 % (ref 0.0–0.2)

## 2020-05-12 LAB — BASIC METABOLIC PANEL
Anion gap: 9 (ref 5–15)
BUN: 26 mg/dL — ABNORMAL HIGH (ref 8–23)
CO2: 20 mmol/L — ABNORMAL LOW (ref 22–32)
Calcium: 7.8 mg/dL — ABNORMAL LOW (ref 8.9–10.3)
Chloride: 108 mmol/L (ref 98–111)
Creatinine, Ser: 2.11 mg/dL — ABNORMAL HIGH (ref 0.44–1.00)
GFR calc Af Amer: 28 mL/min — ABNORMAL LOW (ref >60)
GFR calc non Af Amer: 24 mL/min — ABNORMAL LOW (ref >60)
Glucose, Bld: 99 mg/dL (ref 70–99)
Potassium: 4.2 mmol/L (ref 3.5–5.1)
Sodium: 137 mmol/L (ref 135–145)

## 2020-05-12 LAB — GLUCOSE, CAPILLARY
Glucose-Capillary: 96 mg/dL (ref 70–99)
Glucose-Capillary: 161 mg/dL — ABNORMAL HIGH (ref 70–99)

## 2020-05-12 LAB — LACTIC ACID, PLASMA
Lactic Acid, Venous: 0.7 mmol/L (ref 0.5–1.9)
Lactic Acid, Venous: 1.1 mmol/L (ref 0.5–1.9)

## 2020-05-12 LAB — SARS CORONAVIRUS 2 BY RT PCR (HOSPITAL ORDER, PERFORMED IN ~~LOC~~ HOSPITAL LAB): SARS Coronavirus 2: NEGATIVE

## 2020-05-12 NOTE — TOC Transition Note (Signed)
Transition of Care [facility]) - CM/SW Discharge Note   Patient Details  Name: [name] MRN: [id number] Date of Birth: [birth year]  Transition of [facility]) CM/SW Contact:  Vinie Sill, [place] Phone Number: 05/12/2020, 11:21 AM   Clinical Narrative:     Patient will DC to: [name]  DC Date: 05/12/2020 Family Notified: unable to reach patient's father,[place] to leave ,message, patient is AX4 Transport By: Corey Harold   Per MD patient is ready for discharge. RN, patient, and facility notified of DC. Discharge Summary sent to facility. RN given number for [phone]). Ambulance transport requested for patient.   Clinical Social Worker signing off.  Thurmond Butts, MSW, [place] Clinical Social Worker   Final next level of care: Skilled Nursing Facility Barriers to Discharge: Barriers Resolved   Patient Goals and CMS Choice Patient states their goals for this hospitalization and ongoing recovery are:: To return independent CMS Medicare.gov Compare Post Acute Care list provided to:: Patient Choice offered to / list presented to : Patient  Discharge Placement PASRR number recieved: 05/11/20            Patient chooses bed at: [facility] Patient to be transferred to facility by: [facility] Name of family member notified: attempted to call father per pateint's request- unable to reach father- patient is AX4 Patient and family notified of of transfer: 05/12/20  Discharge Plan and Services                                     Social Determinants of Health (SDOH) Interventions     Readmission Risk Interventions No flowsheet data found.

## 2020-05-12 NOTE — Progress Notes (Signed)
Subjective: Intermittently having bowel movements.  Objective: Vital signs in last 24 hours: Temp:  [97.4 F (36.3 C)-98.8 F (37.1 C)] 98.8 F (37.1 C) (08/26 1130) Pulse Rate:  [79-87] 87 (08/26 1130) Resp:  [20-24] 20 (08/26 1130) BP: (109-129)/(37-52) 122/52 (08/26 1130) SpO2:  [95 %-100 %] 99 % (08/26 1130) Weight change:  Last BM Date: 05/11/20  PE: GEN:  NAD ABD:  Mild distension, mild tenderness, no peritonitis  Lab Results: CBC    Component Value Date/Time   WBC 8.8 05/12/2020 0532   RBC 2.39 (L) 05/12/2020 0532   HGB 7.9 (L) 05/12/2020 0532   HCT 25.2 (L) 05/12/2020 0532   PLT 211 05/12/2020 0532   MCV 105.4 (H) 05/12/2020 0532   MCH 33.1 05/12/2020 0532   MCHC 31.3 05/12/2020 0532   RDW 16.1 (H) 05/12/2020 0532   LYMPHSABS 1.9 05/12/2020 0532   MONOABS 1.0 05/12/2020 0532   EOSABS 0.5 05/12/2020 0532   BASOSABS 0.1 05/12/2020 0532   CMP     Component Value Date/Time   NA 137 05/12/2020 0750   K 4.2 05/12/2020 0750   CL 108 05/12/2020 0750   CO2 20 (L) 05/12/2020 0750   GLUCOSE 99 05/12/2020 0750   BUN 26 (H) 05/12/2020 0750   CREATININE 2.11 (H) 05/12/2020 0750   CALCIUM 7.8 (L) 05/12/2020 0750   PROT 6.7 05/02/2020 1908   ALBUMIN 3.6 05/02/2020 1908   AST 29 05/02/2020 1908   ALT 17 05/02/2020 1908   ALKPHOS 60 05/02/2020 1908   BILITOT 0.7 05/02/2020 1908   GFRNONAA 24 (L) 05/12/2020 0750   GFRAA 28 (L) 05/12/2020 0750   Assessment:  1.  Constipation, obstipation.  Improving. 2.  Hematemesis, resolved, unrevealing endoscopy.  Plan:  1.  I am not convinced patient is having complete bowel evacuation; we will get abdominal xray to evaluate this; if still having significant stool, would consider bowel prep prior to discharge to ensure more complete bowel evacuation. 2.  [facility] will follow.   Landry Dyke 05/12/2020, 12:20 PM   Cell [phone] If no answer or after 5 PM call [phone]

## 2020-05-12 NOTE — Plan of Care (Signed)
  Problem: Education: Goal: Knowledge of General Education information will improve Description: Including pain rating scale, medication(s)/side effects and non-pharmacologic comfort measures 05/12/2020 1351 by Lawanda Cousins, RN Outcome: Completed/Met 05/12/2020 1350 by Lawanda Cousins, RN Outcome: Progressing   Problem: Health Behavior/Discharge Planning: Goal: Ability to manage health-related needs will improve 05/12/2020 1351 by Lawanda Cousins, RN Outcome: Completed/Met 05/12/2020 1350 by Lawanda Cousins, RN Outcome: Progressing   Problem: Clinical Measurements: Goal: Ability to maintain clinical measurements within normal limits will improve 05/12/2020 1351 by Lawanda Cousins, RN Outcome: Completed/Met 05/12/2020 1350 by Lawanda Cousins, RN Outcome: Progressing Goal: Will remain free from infection 05/12/2020 1351 by Lawanda Cousins, RN Outcome: Completed/Met 05/12/2020 1350 by Lawanda Cousins, RN Outcome: Progressing Goal: Diagnostic test results will improve 05/12/2020 1351 by Lawanda Cousins, RN Outcome: Completed/Met 05/12/2020 1350 by Lawanda Cousins, RN Outcome: Progressing Goal: Respiratory complications will improve 05/12/2020 1351 by Lawanda Cousins, RN Outcome: Completed/Met 05/12/2020 1350 by Lawanda Cousins, RN Outcome: Progressing Goal: Cardiovascular complication will be avoided 05/12/2020 1351 by Lawanda Cousins, RN Outcome: Completed/Met 05/12/2020 1350 by Lawanda Cousins, RN Outcome: Progressing   Problem: Activity: Goal: Risk for activity intolerance will decrease 05/12/2020 1351 by Lawanda Cousins, RN Outcome: Completed/Met 05/12/2020 1350 by Lawanda Cousins, RN Outcome: Progressing   Problem: Nutrition: Goal: Adequate nutrition will be maintained 05/12/2020 1351 by Lawanda Cousins, RN Outcome: Completed/Met 05/12/2020 1350 by Lawanda Cousins, RN Outcome: Progressing   Problem: Coping: Goal: Level of anxiety will decrease 05/12/2020 1351 by Lawanda Cousins, RN Outcome:  Completed/Met 05/12/2020 1350 by Lawanda Cousins, RN Outcome: Progressing   Problem: Elimination: Goal: Will not experience complications related to bowel motility 05/12/2020 1351 by Lawanda Cousins, RN Outcome: Completed/Met 05/12/2020 1350 by Lawanda Cousins, RN Outcome: Progressing Goal: Will not experience complications related to urinary retention 05/12/2020 1351 by Lawanda Cousins, RN Outcome: Completed/Met 05/12/2020 1350 by Lawanda Cousins, RN Outcome: Progressing   Problem: Pain Managment: Goal: General experience of comfort will improve 05/12/2020 1351 by Lawanda Cousins, RN Outcome: Completed/Met 05/12/2020 1350 by Lawanda Cousins, RN Outcome: Progressing   Problem: Safety: Goal: Ability to remain free from injury will improve 05/12/2020 1351 by Lawanda Cousins, RN Outcome: Completed/Met 05/12/2020 1350 by Lawanda Cousins, RN Outcome: Progressing   Problem: Skin Integrity: Goal: Risk for impaired skin integrity will decrease 05/12/2020 1351 by Lawanda Cousins, RN Outcome: Completed/Met 05/12/2020 1350 by Lawanda Cousins, RN Outcome: Progressing

## 2020-05-12 NOTE — Plan of Care (Signed)

## 2020-05-12 NOTE — Progress Notes (Signed)
This Probation officer attempted t call report to [facility] where patient is being discharged ,unoforturnately at the two attempted times (1236pm and 1308pm) the calls were not answered with a message being left at 1236. Nurse contacted SW to see if there was another number to call, SW personally contacted the SW at the facility whom stated "she was walking up front to tell them we were calling and to call back to ask for Pam."

## 2020-05-12 NOTE — Discharge Summary (Signed)
Physician Discharge Summary  Tina Ramos HBZ:[id number] DOB: 10-Dec-1955 DOA: 05/02/2020  PCP: [facility] date: 05/02/2020 Discharge date: 05/12/2020  Admitted From: Home Disposition: SNF  Recommendations for Outpatient Follow-up:  1. Follow up with PCP in 1-2 weeks 2. Follow with orthopedics in 1 to 2 weeks 3. Please obtain BMP/CBC in one week 4. Please follow up with your PCP on the following pending results: Unresulted Labs (From admission, onward)          Start     Ordered   05/12/20 0739  Lactic acid, plasma  STAT Now then every 3 hours,   R     Question:  Specimen collection method  Answer:  Lab=Lab collect   05/12/20 0739   05/09/20 0500  CBC with Differential/Platelet  Daily,   R     Question:  Specimen collection method  Answer:  Lab=Lab collect   05/08/20 [place]: None Equipment/Devices: None  Discharge Condition: Stable CODE STATUS: Full code Diet recommendation: Cardiac  Subjective: Seen and examined.  Feels much better.  No abdominal pain or nausea.  Has had multiple bowel movements in last 24 hours again.  Brief/Interim Summary: Tina Ramos a 64 y.o.femalewith medical history significant ofliver cirrhosis, insulin-dependent type 2 diabetes, hyperlipidemia, gastroparesis, CKD stage IV presented to the ED after sustaining head injury from a fall. Patient was recently evaluated in the ED on 8/9 after a fall and was diagnosed with a mildly displaced spiral fracture of the proximal diaphysis of the left humerus. she was not able to see orthopedics yet.  Complained of extremely swollen and painful left upper extremity associated with numbness and tingling in her fingers in the same extremity. she had multiple falls in the past 2 weeks. Each time she describes it as just falling on the ground all of a sudden.Denied prodromal symptoms or any history of seizure. her 87 year old father takes care of her.She takes a  baby aspirin daily.  Upon arrival to ED she was a stable with COVID-19 negative. Head CT showing acute bilateral subdural hematomas which extend along the tentorium, right greater than left. Right posterior parietal scalp laceration and hematoma. CT maxillofacial showing nondisplaced bilateral nasal bone fractures. CT C-spine negative for acute abnormality. X-ray of right shoulder negative for acute fracture or dislocation. Chest x-ray showingnoacute intrathoracic process.   Extremity x-ray shows knowndisplaced left humeral diaphyseal fracture.  ED provider discussed the case with Dr. Trenton Gammon from neurosurgery who felt that the subdural hematomas were small. He recommended neurochecks and potential discharge from the ED. Admission requested as it was not safe for the patient to go home given frequency of falls. Scalp laceration was repaired in the ED. Foley catheter was placed as patient does self-catheterization at home. Patient received fentanyl, IV fluid, and Tdap booster.  Admitted under hospitalist service.  Orthopedics were consulted to see her on urgent basis for concern of possible compartment syndrome in the left upper extremity.  They ruled out compartment syndrome.  Subsequently she underwent  left humeral shaft intramedullary nail fixation 05/04/2020.  Repeat head CT showed stable subdural hematomas. she had one episode of vomiting which was thought to be black on late morning of 05/04/2020.  Patient denied having any history of GI bleed in the past but she has history of GERD for which she takes Protonix.  Hemoglobin was repeated which was stable.  For some reason, sample was not taken  for occult blood.  Subsequently over the course of next 2 days, her hemoglobin dropped to 6.4 on 05/06/2020 and she received 1 unit of PRBC transfusion and GI was consulted and she underwent EGD on 05/07/2020 which was negative for any acute bleeding or any pathology.  Patient's hemoglobin is remained stable over  7 since then and she did not require any further transfusion.  Patient also developed fever of 102.3 at 8 PM on 05/09/2020.  There was no sign of infection.  Slightly elevated lactic acid but no leukocytosis.  Patient was monitored off of antibiotics.  She has remained afebrile since then and it has been almost 3 days.  However, when all this happened, patient also started having some nausea and vomiting and she was then diagnosed with ileus/small bowel obstruction.  GI was already on board and they consulted general surgery.  General surgery recommended continuing supportive care and did not think that patient needed any surgical intervention and thus they signed off.  She was kept n.p.o. with IV fluids.  She then started feeling well and she was started on clear liquid diets and diet was advanced to regular diet yesterday which she has been tolerating well.  She has been having at least 2-3 bowel movements every day since last 2 to 3 days.  She is feeling much better today.  Hemodynamically stable.  Renal function back to baseline.  She was seen by PT OT and they recommended SNF which has been arranged for her.  She will be discharged in stable condition today.  Note, she was taking aspirin 81 mg prior to hospitalization.  Patient does not know why she was taking.  I cannot find any history of CVA or any CAD.  Due to history of subdural hematoma and anemia requiring blood transfusion and hemoglobin hovering just over 7 and the fact that we do not know why she was taking aspirin, I have decided to discontinue this medication for her.  She was also on Klonopin prior to hospitalization.  Patient has not required any Klonopin and the indication was anxiety.  Patient has remained pleasant throughout her hospitalization.  I have discontinued Klonopin as well.  Discharge Diagnoses:  Principal Problem:   Subdural hematoma ([place]) Active Problems:   Humerus fracture   Scalp laceration   Nasal bone fractures   Falls  frequently   Ileus ([facility])   SBO (small bowel obstruction) ([facility])    Discharge Instructions   Allergies as of 05/12/2020      Reactions   Pentazocine Hives, Itching, Rash      Propoxyphene Itching, Other (See Comments)   Behavioral Changes.   Sulfa Antibiotics Hives, Itching, Swelling, Rash   Codeine Hives, Itching, Rash   Other Hives, Rash   Adhesive Tape      Medication List    STOP taking these medications   aspirin EC 81 MG tablet   clonazePAM 1 MG tablet Commonly known as: KLONOPIN   fexofenadine 60 MG tablet Commonly known as: ALLEGRA   HYDROcodone-acetaminophen 5-325 MG tablet Commonly known as: NORCO/VICODIN   ibuprofen 600 MG tablet Commonly known as: ADVIL     TAKE these medications   atorvastatin 20 MG tablet Commonly known as: LIPITOR Take 20 mg by mouth daily.   desvenlafaxine 50 MG 24 hr tablet Commonly known as: PRISTIQ Take 50 mg by mouth daily.   estradiol 1 MG tablet Commonly known as: ESTRACE Take 1 mg by mouth daily.   gabapentin 100 MG capsule Commonly  known as: NEURONTIN Take 100 mg by mouth 3 (three) times daily.   insulin glargine 100 UNIT/ML injection Commonly known as: LANTUS Inject 0-20 Units into the skin See admin instructions. Sliding scale: if BG is less than  < 150, 0 units and if > 150, 4 units. Pt increases by 1 unit per increase of 25 mmol/L in BG.   mupirocin cream 2 % Commonly known as: BACTROBAN Apply 1 application topically 2 (two) times daily. For 7 days   ondansetron 4 MG disintegrating tablet Commonly known as: Zofran ODT 4mg  ODT q4 hours prn nausea/vomit   oxybutynin 15 MG 24 hr tablet Commonly known as: DITROPAN XL Take 1 tablet by mouth daily as needed (overactive bladder).   oxyCODONE 5 MG immediate release tablet Commonly known as: Oxy IR/ROXICODONE Take 1 pills every 4-6 hrs as needed for pain   pantoprazole 40 MG tablet Commonly known as: PROTONIX Take 40 mg by mouth daily.   promethazine 25  MG tablet Commonly known as: PHENERGAN Take 1 tablet (25 mg total) by mouth every 6 (six) hours as needed for nausea.   QUEtiapine 100 MG tablet Commonly known as: SEROQUEL Take 300 mg by mouth at bedtime.   rizatriptan 10 MG tablet Commonly known as: MAXALT Take 10-20 mg by mouth daily as needed for migraine.   sevelamer carbonate 800 MG tablet Commonly known as: RENVELA Take 1,600 mg by mouth 3 (three) times daily with meals.   torsemide 20 MG tablet Commonly known as: DEMADEX Take 20 mg by mouth 3 (three) times a week.       Follow-up Information    Hiram Gash, MD In 3 weeks.   Specialty: Orthopedic Surgery Why: For wound re-check Contact information: [address] [place] [address] [phone]        [facility] Follow up in 1 week(s).   Contact information: [address]  [address] [place] [address] [phone]              Allergies  Allergen Reactions  . Pentazocine Hives, Itching and Rash       . Propoxyphene Itching and Other (See Comments)    Behavioral Changes.   . Sulfa Antibiotics Hives, Itching, Swelling and Rash  . Codeine Hives, Itching and Rash  . Other Hives and Rash    Adhesive Tape    Consultations: Orthopedics, GI and general surgery   Procedures/Studies: CT ABDOMEN PELVIS WO CONTRAST  Result Date: 05/08/2020 CLINICAL DATA:  Central abdominal pain. EXAM: CT ABDOMEN AND PELVIS WITHOUT CONTRAST TECHNIQUE: Multidetector CT imaging of the abdomen and pelvis was performed following the standard protocol without IV contrast. COMPARISON:  August 19, 2019 FINDINGS: Lower chest: Moderate RIGHT small LEFT pleural effusion. Basilar atelectasis. Patulous distal esophagus is filled with ingested contrast. No pericardial effusion. Heart is incompletely imaged. Hepatobiliary: Evidence of hepatic cirrhosis. Cholelithiasis and or sludge in the gallbladder lumen without pericholecystic  stranding. Pancreas: Pancreas is atrophic without ductal dilation or inflammation. Spleen: Spleen mildly enlarged. Adrenals/Urinary Tract: Adrenal glands are normal. Bilateral perinephric stranding in the setting of mild generalized edema. Renal cortical scarring. No hydronephrosis. No nephrolithiasis. Urinary bladder with small amount of gas in the anti dependent portion. Bladder is under distended. Clear fat plane between the urinary bladder in the adjacent bowel. No signs of bladder wall thickening to indicate fistula. Stomach/Bowel: Marked stool in the colon throughout the colon well-formed stool as it transitions more distally, partially liquid stool seen  proximally. No pericolonic stranding. Small bowel partially filled with ingested contrast material. Moderately dilated in the mid small bowel up to approximately 3 cm dilated loops are centralized with transition, gradual transition at the distal jejunum. Some dilute contrast distal to the site of transition. No perienteric stranding. Transition occurring in the LEFT mid abdomen on images 43-50. Vascular/Lymphatic: Scattered calcified atheromatous plaque of the abdominal aorta. No aneurysmal dilation. No adenopathy. Reproductive: Post hysterectomy.  No adnexal mass. Other: No ascites.  No free air. Musculoskeletal: Sacral nerve stimulator in place passing through the S2 neural foramen on the LEFT. Power pack over RIGHT gluteal region. No acute musculoskeletal process. Spinalw degenerative changes. IMPRESSION: 1. Developing ileus versus partial small bowel obstruction in the setting of marked constipation. Dilated small bowel loops are however not distal small bowel loops but are proximal to mid small bowel loops. 2. Moderate RIGHT small LEFT pleural effusion. 3. Patulous esophagus may indicate dysmotility are marked reflux. 4. Bilateral perinephric stranding, nonspecific in the setting of developing anasarca. Correlate with urine studies to exclude the  possibility of urinary tract infection. 5. Cirrhosis and portal hypertension as before. 6. Cholelithiasis and or sludge in the gallbladder lumen without pericholecystic stranding. 7. Mild anasarca with body wall edema and small effusions. Trace ascites. 8. Aortic atherosclerosis. Aortic Atherosclerosis (ICD10-I70.0). Electronically Signed   By: Zetta Bills M.D.   On: 05/08/2020 19:12   DG Chest 2 View  Result Date: 05/02/2020 CLINICAL DATA:  [place], recent left humeral fracture EXAM: CHEST - 2 VIEW COMPARISON:  04/25/2020 FINDINGS: Frontal and lateral views of the chest demonstrate displaced comminuted left humeral diaphyseal fracture, with increased displacement since prior study. Cardiac silhouette is unremarkable. No airspace disease, effusion, or pneumothorax. No new fractures. IMPRESSION: 1. Known displaced left humeral diaphyseal fracture, partially imaged on this study. Increased displacement since prior exam. 2. Otherwise no acute intrathoracic process. Electronically Signed   By: Randa Ngo M.D.   On: 05/02/2020 19:25   DG Shoulder Right  Result Date: 05/02/2020 CLINICAL DATA:  Right shoulder pain after fall EXAM: RIGHT SHOULDER - 2+ VIEW COMPARISON:  04/18/2020 FINDINGS: Frontal, transscapular, and axillary views of the right shoulder demonstrate no fracture, subluxation, or dislocation. Mild acromioclavicular and glenohumeral joint osteoarthritis. Right chest is clear. Soft tissues are normal. IMPRESSION: 1. Osteoarthritis.  No acute fracture. Electronically Signed   By: Randa Ngo M.D.   On: 05/02/2020 19:18   CT HEAD WO CONTRAST  Result Date: 05/05/2020 CLINICAL DATA:  Subdural hematoma. EXAM: CT HEAD WITHOUT CONTRAST TECHNIQUE: Contiguous axial images were obtained from the base of the skull through the vertex without intravenous contrast. COMPARISON:  05/03/2020 FINDINGS: Brain: A small subdural hematoma along both leaflets of the tentorium has not significantly changed in size,  measuring up to 5 mm in thickness on the right and 3 mm on the left. There is no associated mass effect. No new intracranial hemorrhage, acute infarct, mass, or midline shift is identified. The ventricles and sulci are normal. Vascular: Calcified atherosclerosis at the skull base. Skull: No fracture or suspicious osseous lesion. Sinuses/Orbits: Visualized paranasal sinuses and mastoid air cells are clear. Bilateral cataract extraction. Other: Persistent small right parietal scalp hematoma with skin staples in place. IMPRESSION: 1. Unchanged small tentorial subdural hematoma. 2. No new intracranial abnormality. Electronically Signed   By: Logan Bores M.D.   On: 05/05/2020 12:49   CT HEAD WO CONTRAST  Result Date: 05/03/2020 CLINICAL DATA:  Subdural hemorrhage follow-up EXAM: CT HEAD WITHOUT CONTRAST  TECHNIQUE: Contiguous axial images were obtained from the base of the skull through the vertex without intravenous contrast. COMPARISON:  Head CT from yesterday FINDINGS: Brain: Subdural hematoma on both leaflets of the tentorium, up to 5 mm in thickness on the right, unchanged. No associated mass effect. No interval hemorrhage or brain swelling. Negative for infarct or hydrocephalus. Vascular: Negative Skull: Scalp contusion on the right. No acute underlying fracture. Known nasal bone fracture. Sinuses/Orbits: No evidence of injury IMPRESSION: Size stable tentorial subdural hematoma without mass effect. No new abnormality. Electronically Signed   By: Monte Fantasia M.D.   On: 05/03/2020 07:53   CT Head Wo Contrast  Result Date: 05/02/2020 CLINICAL DATA:  Fall.  Hit head on plaster wall. EXAM: CT HEAD WITHOUT CONTRAST CT MAXILLOFACIAL WITHOUT CONTRAST CT CERVICAL SPINE WITHOUT CONTRAST TECHNIQUE: Multidetector CT imaging of the head, cervical spine, and maxillofacial structures were performed using the standard protocol without intravenous contrast. Multiplanar CT image reconstructions of the cervical spine and  maxillofacial structures were also generated. COMPARISON:  CT head 01/21/2020 CT maxillofacial 08/04/2018. FINDINGS: CT HEAD FINDINGS Brain: There are acute bilateral subdural hematomas which extend along the tentorium, right greater than left. On the right this measures up to 6 mm in thickness. On the left this measures approximately 3 mm in maximum thickness. No signs of acute brain infarct or mass. Ventricular volumes are within normal limits. Vascular: No hyperdense vessel or unexpected calcification. Skull: Normal. Negative for fracture or focal lesion. Other: Right posterior parietal scalp laceration and hematoma is noted, image 62/3. CT MAXILLOFACIAL FINDINGS Osseous: Nondisplaced bilateral nasal bone fractures, image 48/3. No additional fractures identified. Orbits: Negative. No traumatic or inflammatory finding. Sinuses: Clear Soft tissues: Negative. CT CERVICAL SPINE FINDINGS Alignment: Alignment of the cervical spine appears normal. The facet joints appear well aligned. Skull base and vertebrae: No acute fracture. No primary bone lesion or focal pathologic process. Soft tissues and spinal canal: No prevertebral fluid or swelling. No visible canal hematoma. Disc levels:  Within normal limits. Upper chest: Negative. Other: None IMPRESSION: 1. Acute bilateral subdural hematomas which extend along the tentorium, right greater than left. 2. Right posterior parietal scalp laceration and hematoma. 3. Nondisplaced bilateral nasal bone fractures. 4. No evidence for cervical spine fracture or dislocation. 5. Critical Value/emergent results were called by telephone at the time of interpretation on 05/02/2020 at 7:11 pm to provider Dr. Melina Copa, Who verbally acknowledged these results. Electronically Signed   By: Kerby Moors M.D.   On: 05/02/2020 19:12   CT Cervical Spine Wo Contrast  Result Date: 05/02/2020 CLINICAL DATA:  Fall.  Hit head on plaster wall. EXAM: CT HEAD WITHOUT CONTRAST CT MAXILLOFACIAL WITHOUT  CONTRAST CT CERVICAL SPINE WITHOUT CONTRAST TECHNIQUE: Multidetector CT imaging of the head, cervical spine, and maxillofacial structures were performed using the standard protocol without intravenous contrast. Multiplanar CT image reconstructions of the cervical spine and maxillofacial structures were also generated. COMPARISON:  CT head 01/21/2020 CT maxillofacial 08/04/2018. FINDINGS: CT HEAD FINDINGS Brain: There are acute bilateral subdural hematomas which extend along the tentorium, right greater than left. On the right this measures up to 6 mm in thickness. On the left this measures approximately 3 mm in maximum thickness. No signs of acute brain infarct or mass. Ventricular volumes are within normal limits. Vascular: No hyperdense vessel or unexpected calcification. Skull: Normal. Negative for fracture or focal lesion. Other: Right posterior parietal scalp laceration and hematoma is noted, image 62/3. CT MAXILLOFACIAL FINDINGS Osseous: Nondisplaced bilateral nasal  bone fractures, image 48/3. No additional fractures identified. Orbits: Negative. No traumatic or inflammatory finding. Sinuses: Clear Soft tissues: Negative. CT CERVICAL SPINE FINDINGS Alignment: Alignment of the cervical spine appears normal. The facet joints appear well aligned. Skull base and vertebrae: No acute fracture. No primary bone lesion or focal pathologic process. Soft tissues and spinal canal: No prevertebral fluid or swelling. No visible canal hematoma. Disc levels:  Within normal limits. Upper chest: Negative. Other: None IMPRESSION: 1. Acute bilateral subdural hematomas which extend along the tentorium, right greater than left. 2. Right posterior parietal scalp laceration and hematoma. 3. Nondisplaced bilateral nasal bone fractures. 4. No evidence for cervical spine fracture or dislocation. 5. Critical Value/emergent results were called by telephone at the time of interpretation on 05/02/2020 at 7:11 pm to provider Dr. Melina Copa, Who  verbally acknowledged these results. Electronically Signed   By: Kerby Moors M.D.   On: 05/02/2020 19:12   CT SHOULDER LEFT WO CONTRAST  Result Date: 05/03/2020 CLINICAL DATA:  The patient suffered a left humerus fracture in a fall 04/25/2020. Subsequent encounter. EXAM: CT OF THE UPPER LEFT EXTREMITY WITHOUT CONTRAST TECHNIQUE: Multidetector CT imaging of the upper left extremity was performed according to the standard protocol. COMPARISON:  Plain films of the left humerus 04/25/2020. FINDINGS: Bones/Joint/Cartilage As seen on the comparison examination, the patient has a spiral fracture of the proximal humerus. The most superior fracture line is 5 cm below the top of the humeral head and nondisplaced. Approximately 7.5 cm below the top of the humeral head, the fracture is displaced with fragment override of approximately 1.6 cm and almost 1 shaft width medial displacement of the distal fragment. There is also approximately 30 degrees dorsal angulation of the distal fragment. The fracture exits the diaphysis of the humerus approximately 17 cm below the top of the humeral head. The humeral head is located and the acromioclavicular joint is intact. Mild acromioclavicular osteoarthritis is present. Multilevel cervical facet degenerative disease is partially imaged. Ligaments Suboptimally assessed by CT. Muscles and Tendons There is some hematoma formation about the patient's fracture. Soft tissues Soft tissue contusion about the fracture noted. IMPRESSION: Acute spiral fracture of the diaphysis of the left humerus as described. Electronically Signed   By: Inge Rise M.D.   On: 05/03/2020 10:46   DG Abd Portable 1V  Result Date: 05/08/2020 CLINICAL DATA:  Abdominal pain EXAM: PORTABLE ABDOMEN - 1 VIEW COMPARISON:  None. FINDINGS: Spinal stimulator overlies the right lower quadrant with lead coursing into the sacrum with tip not seen on this radiograph. Mildly dilated central abdominal small bowel loops  up to 3.0 cm diameter. Mild gastric gaseous distention. Moderate colonic stool. No evidence of pneumatosis or pneumoperitoneum. No radiopaque nephrolithiasis. IMPRESSION: Mildly dilated central abdominal small bowel loops, cannot exclude ileus or mid to distal small bowel obstruction. Mild gastric gaseous distention. Moderate colonic stool. CT abdomen/pelvis with oral and IV contrast may be obtained for further evaluation as clinically warranted. Electronically Signed   By: Ilona Sorrel M.D.   On: 05/08/2020 11:52   DG Humerus Left  Result Date: 05/11/2020 CLINICAL DATA:  Postop evaluation EXAM: LEFT HUMERUS - 2+ VIEW COMPARISON:  Postoperative studies from May 04, 2020 FINDINGS: Postoperative changes of intramedullary rod placement into the LEFT humerus spanning a proximal humeral fracture. Displacement of the fracture appear similar to intraoperative radiographs perhaps slightly increased compared to postoperative radiographs from May 04, 2020 though some of this could be related to the obliquity of the current study  as compared to the prior exam. No new fracture is identified. IMPRESSION: Postoperative changes of intramedullary rod placement into a proximal LEFT humeral fracture. Question further lateral displacement of proximal fracture fragment along the margin of the intramedullary rod but with unchanged appearance of hardware. Some of this could be due to obliquity. Electronically Signed   By: Zetta Bills M.D.   On: 05/11/2020 16:03   DG Humerus Left  Result Date: 05/04/2020 CLINICAL DATA:  Fixation of left humerus fracture. EXAM: LEFT HUMERUS - 2+ VIEW; DG C-ARM 1-60 MIN COMPARISON:  CT scan 05/03/2020 FINDINGS: Intraoperative spot films and postoperative radiographs demonstrate an intramedullary rod in the humerus with 2 proximal and 1 distal interlocking screws. This is transfixing the spiral type proximal humeral shaft fracture. Mild displacement of the fracture is demonstrated.  IMPRESSION: Internal fixation of the proximal humeral shaft fracture. Electronically Signed   By: Marijo Sanes M.D.   On: 05/04/2020 16:49   DG Humerus Left  Result Date: 05/04/2020 CLINICAL DATA:  Fixation of left humerus fracture. EXAM: LEFT HUMERUS - 2+ VIEW; DG C-ARM 1-60 MIN COMPARISON:  CT scan 05/03/2020 FINDINGS: Intraoperative spot films and postoperative radiographs demonstrate an intramedullary rod in the humerus with 2 proximal and 1 distal interlocking screws. This is transfixing the spiral type proximal humeral shaft fracture. Mild displacement of the fracture is demonstrated. IMPRESSION: Internal fixation of the proximal humeral shaft fracture. Electronically Signed   By: Marijo Sanes M.D.   On: 05/04/2020 16:49   DG Humerus Left  Result Date: 04/25/2020 CLINICAL DATA:  64 year old female with fall and trauma to the left upper extremity. EXAM: LEFT HUMERUS - 2+ VIEW COMPARISON:  None. FINDINGS: There is a mildly displaced spiral fracture of the proximal diaphysis of the left humerus with approximately 11 mm medial displacement of the distal fracture fragment. The bones are osteopenic. There is no dislocation. The soft tissues are unremarkable. IMPRESSION: Mildly displaced spiral fracture of the proximal diaphysis of the left humerus. Electronically Signed   By: Anner Crete M.D.   On: 04/25/2020 21:51   EEG adult  Result Date: 05/03/2020 Lora Havens, MD     05/03/2020 12:30 PM Patient Name: Tina [facility] MRN: [id number] Epilepsy Attending: Lora Havens Referring Physician/Provider: Dr. Shela Leff Date: 8/70/2021 Duration: 26.19 mins Patient history: 27 old female with multiple episodes of fall/syncope.  EEG evaluate for seizures. Level of alertness: Awake AEDs during EEG study: None Technical aspects: This EEG study was done with scalp electrodes positioned according to the 10-20 International system of electrode placement. Electrical activity was acquired at a  sampling rate of 500Hz  and reviewed with a high frequency filter of 70Hz  and a low frequency filter of 1Hz . EEG data were recorded continuously and digitally stored. Description: The posterior dominant rhythm consists of 7.5 Hz activity of moderate voltage (25-35 uV) seen predominantly in posterior head regions, symmetric and reactive to eye opening and eye closing. Hyperventilation and photic stimulation were not performed.   IMPRESSION: This study is within normal limits. No seizures or epileptiform discharges were seen throughout the recording. Lora Havens   DG C-Arm 1-60 Min  Result Date: 05/04/2020 CLINICAL DATA:  Fixation of left humerus fracture. EXAM: LEFT HUMERUS - 2+ VIEW; DG C-ARM 1-60 MIN COMPARISON:  CT scan 05/03/2020 FINDINGS: Intraoperative spot films and postoperative radiographs demonstrate an intramedullary rod in the humerus with 2 proximal and 1 distal interlocking screws. This is transfixing the spiral type proximal humeral shaft fracture. Mild  displacement of the fracture is demonstrated. IMPRESSION: Internal fixation of the proximal humeral shaft fracture. Electronically Signed   By: Marijo Sanes M.D.   On: 05/04/2020 16:49   ECHOCARDIOGRAM COMPLETE  Result Date: 05/03/2020    ECHOCARDIOGRAM REPORT   Patient Name:   Tina Ramos Date of Exam: 05/03/2020 Medical Rec #:  [id number]        Height:       62.0 in Accession #:    [id number]       Weight:       137.0 lb Date of Birth:  [birth year]        BSA:          1.628 m Patient Age:    42 years         BP:           164/63 mmHg Patient Gender: F                HR:           108 bpm. Exam Location:  Inpatient Procedure: 2D Echo, Cardiac Doppler, Color Doppler and Intracardiac            Opacification Agent Indications:    R55 Syncope  History:        Patient has no prior history of Echocardiogram examinations.                 Risk Factors:Dyslipidemia and Diabetes.  Sonographer:    Jonelle Sidle Dance Referring Phys: [id number] [place]  1. Left ventricular ejection fraction, by estimation, is 65 to 70%. The left ventricle has normal function. The left ventricle has no regional wall motion abnormalities. There is mild left ventricular hypertrophy. Left ventricular diastolic parameters were normal.  2. Right ventricular systolic function is normal. The right ventricular size is normal. Tricuspid regurgitation signal is inadequate for assessing PA pressure.  3. The mitral valve is normal in structure. Trivial mitral valve regurgitation.  4. The aortic valve is tricuspid. Aortic valve regurgitation is not visualized. Mild aortic valve sclerosis is present, with no evidence of aortic valve stenosis.  5. The inferior vena cava is normal in size with greater than 50% respiratory variability, suggesting right atrial pressure of 3 mmHg. FINDINGS  Left Ventricle: Left ventricular ejection fraction, by estimation, is 65 to 70%. The left ventricle has normal function. The left ventricle has no regional wall motion abnormalities. Definity contrast agent was given IV to delineate the left ventricular  endocardial borders. The left ventricular internal cavity size was normal in size. There is mild left ventricular hypertrophy. Left ventricular diastolic parameters were normal. Right Ventricle: The right ventricular size is normal. Right vetricular wall thickness was not assessed. Right ventricular systolic function is normal. Tricuspid regurgitation signal is inadequate for assessing PA pressure. Left Atrium: Left atrial size was normal in size. Right Atrium: Right atrial size was normal in size. Pericardium: There is no evidence of pericardial effusion. Mitral Valve: The mitral valve is normal in structure. Trivial mitral valve regurgitation. Tricuspid Valve: The tricuspid valve is normal in structure. Tricuspid valve regurgitation is trivial. Aortic Valve: The aortic valve is tricuspid. Aortic valve regurgitation is not visualized. Mild  aortic valve sclerosis is present, with no evidence of aortic valve stenosis. Pulmonic Valve: The pulmonic valve was not well visualized. Pulmonic valve regurgitation is not visualized. Aorta: The aortic root and ascending aorta are structurally normal, with no evidence of dilitation. Venous: The inferior vena cava is normal in  size with greater than 50% respiratory variability, suggesting right atrial pressure of 3 mmHg. IAS/Shunts: No atrial level shunt detected by color flow Doppler.  LEFT VENTRICLE PLAX 2D LVIDd:         3.80 cm  Diastology LVIDs:         2.70 cm  LV e' lateral:   6.53 cm/s LV PW:         1.20 cm  LV E/e' lateral: 13.3 LV IVS:        1.10 cm  LV e' medial:    7.15 cm/s LVOT diam:     1.70 cm  LV E/e' medial:  12.1 LV SV:         69 LV SV Index:   43 LVOT Area:     2.27 cm  RIGHT VENTRICLE             IVC RV Basal diam:  2.00 cm     IVC diam: 1.60 cm RV S prime:     14.30 cm/s TAPSE (M-mode): 1.9 cm LEFT ATRIUM             Index       RIGHT ATRIUM          Index LA diam:        4.10 cm 2.52 cm/m  RA Area:     7.09 cm LA Vol (A2C):   39.7 ml 24.39 ml/m RA Volume:   11.50 ml 7.06 ml/m LA Vol (A4C):   42.0 ml 25.80 ml/m LA Biplane Vol: 41.9 ml 25.74 ml/m  AORTIC VALVE LVOT Vmax:   117.00 cm/s LVOT Vmean:  72.000 cm/s LVOT VTI:    0.305 m  AORTA Ao Root diam: 3.10 cm Ao Asc diam:  2.60 cm MITRAL VALVE MV Area (PHT): 3.48 cm     SHUNTS MV Decel Time: 218 msec     Systemic VTI:  0.30 m MV E velocity: 86.60 cm/s   Systemic Diam: 1.70 cm MV A velocity: 112.00 cm/s MV E/A ratio:  0.77 Oswaldo Milian MD Electronically signed by Oswaldo Milian MD Signature Date/Time: 05/03/2020/11:28:16 AM    Final    CT Maxillofacial WO CM  Result Date: 05/02/2020 CLINICAL DATA:  Fall.  Hit head on plaster wall. EXAM: CT HEAD WITHOUT CONTRAST CT MAXILLOFACIAL WITHOUT CONTRAST CT CERVICAL SPINE WITHOUT CONTRAST TECHNIQUE: Multidetector CT imaging of the head, cervical spine, and maxillofacial  structures were performed using the standard protocol without intravenous contrast. Multiplanar CT image reconstructions of the cervical spine and maxillofacial structures were also generated. COMPARISON:  CT head 01/21/2020 CT maxillofacial 08/04/2018. FINDINGS: CT HEAD FINDINGS Brain: There are acute bilateral subdural hematomas which extend along the tentorium, right greater than left. On the right this measures up to 6 mm in thickness. On the left this measures approximately 3 mm in maximum thickness. No signs of acute brain infarct or mass. Ventricular volumes are within normal limits. Vascular: No hyperdense vessel or unexpected calcification. Skull: Normal. Negative for fracture or focal lesion. Other: Right posterior parietal scalp laceration and hematoma is noted, image 62/3. CT MAXILLOFACIAL FINDINGS Osseous: Nondisplaced bilateral nasal bone fractures, image 48/3. No additional fractures identified. Orbits: Negative. No traumatic or inflammatory finding. Sinuses: Clear Soft tissues: Negative. CT CERVICAL SPINE FINDINGS Alignment: Alignment of the cervical spine appears normal. The facet joints appear well aligned. Skull base and vertebrae: No acute fracture. No primary bone lesion or focal pathologic process. Soft tissues and spinal canal: No prevertebral fluid or swelling. No visible  canal hematoma. Disc levels:  Within normal limits. Upper chest: Negative. Other: None IMPRESSION: 1. Acute bilateral subdural hematomas which extend along the tentorium, right greater than left. 2. Right posterior parietal scalp laceration and hematoma. 3. Nondisplaced bilateral nasal bone fractures. 4. No evidence for cervical spine fracture or dislocation. 5. Critical Value/emergent results were called by telephone at the time of interpretation on 05/02/2020 at 7:11 pm to provider Dr. Melina Copa, Who verbally acknowledged these results. Electronically Signed   By: Kerby Moors M.D.   On: 05/02/2020 19:12     Discharge  Exam: Vitals:   05/12/20 0415 05/12/20 0753  BP: (!) 109/38 (!) 112/37  Pulse: 79 86  Resp: (!) 24 20  Temp: (!) 97.4 F (36.3 C) 98.5 F (36.9 C)  SpO2: 98% 95%   Vitals:   05/11/20 1951 05/11/20 2350 05/12/20 0415 05/12/20 0753  BP: (!) 129/43 (!) 115/42 (!) 109/38 (!) 112/37  Pulse: 85 79 79 86  Resp: (!) 22 20 (!) 24 20  Temp: 98.2 F (36.8 C) 98.1 F (36.7 C) (!) 97.4 F (36.3 C) 98.5 F (36.9 C)  TempSrc: Oral Oral Axillary Oral  SpO2: 99% 100% 98% 95%    General: Pt is alert, awake, not in acute distress Cardiovascular: RRR, S1/S2 +, no rubs, no gallops Respiratory: CTA bilaterally, no wheezing, no rhonchi Abdominal: Soft, NT, ND, bowel sounds + Extremities: no edema, no cyanosis, sling in left upper extremity, some scattered bruises in the left upper extremity but she had a large bruise before and her bruise has improved about 80%.    The results of significant diagnostics from this hospitalization (including imaging, microbiology, ancillary and laboratory) are listed below for reference.     Microbiology: Recent Results (from the past 240 hour(s))  SARS Coronavirus 2 by RT PCR (hospital order, performed in [facility] hospital lab) Nasopharyngeal Nasopharyngeal Swab     Status: None   Collection Time: 05/02/20  7:28 PM   Specimen: Nasopharyngeal Swab  Result Value Ref Range Status   SARS Coronavirus 2 NEGATIVE NEGATIVE Final    Comment: (NOTE) SARS-CoV-2 target nucleic acids are NOT DETECTED.  The SARS-CoV-2 RNA is generally detectable in upper and lower respiratory specimens during the acute phase of infection. The lowest concentration of SARS-CoV-2 viral copies this assay can detect is 250 copies / mL. A negative result does not preclude SARS-CoV-2 infection and should not be used as the sole basis for treatment or other patient management decisions.  A negative result may occur with improper specimen collection / handling, submission of specimen  other than nasopharyngeal swab, presence of viral mutation(s) within the areas targeted by this assay, and inadequate number of viral copies (<250 copies / mL). A negative result must be combined with clinical observations, patient history, and epidemiological information.  Fact Sheet for Patients:   StrictlyIdeas.no  Fact Sheet for Healthcare Providers: BankingDealers.co.za  This test is not yet approved or  cleared by the [place] FDA and has been authorized for detection and/or diagnosis of SARS-CoV-2 by FDA under an Emergency Use Authorization (EUA).  This EUA will remain in effect (meaning this test can be used) for the duration of the COVID-19 declaration under Section 564(b)(1) of the Act, 21 U.S.C. section 360bbb-3(b)(1), unless the authorization is terminated or revoked sooner.  Performed at [facility], [address] [place], [place] [address]   MRSA PCR Screening     Status: None   Collection Time: 05/02/20 11:40 PM  Specimen: Nasal Mucosa; Nasopharyngeal  Result Value Ref Range Status   MRSA by PCR NEGATIVE NEGATIVE Final    Comment:        The GeneXpert MRSA Assay (FDA approved for NASAL specimens only), is one component of a comprehensive MRSA colonization surveillance program. It is not intended to diagnose MRSA infection nor to guide or monitor treatment for MRSA infections. Performed at [facility], [place] [address], [place], [place] [address]   SARS Coronavirus 2 by RT PCR (hospital order, performed in [facility] hospital lab) Nasopharyngeal Nasopharyngeal Swab     Status: None   Collection Time: 05/12/20  7:40 AM   Specimen: Nasopharyngeal Swab  Result Value Ref Range Status   SARS Coronavirus 2 NEGATIVE NEGATIVE Final    Comment: (NOTE) SARS-CoV-2 target nucleic acids are NOT DETECTED.  The SARS-CoV-2 RNA is generally detectable in upper and lower respiratory specimens during  the acute phase of infection. The lowest concentration of SARS-CoV-2 viral copies this assay can detect is 250 copies / mL. A negative result does not preclude SARS-CoV-2 infection and should not be used as the sole basis for treatment or other patient management decisions.  A negative result may occur with improper specimen collection / handling, submission of specimen other than nasopharyngeal swab, presence of viral mutation(s) within the areas targeted by this assay, and inadequate number of viral copies (<250 copies / mL). A negative result must be combined with clinical observations, patient history, and epidemiological information.  Fact Sheet for Patients:   StrictlyIdeas.no  Fact Sheet for Healthcare Providers: BankingDealers.co.za  This test is not yet approved or  cleared by the [place] FDA and has been authorized for detection and/or diagnosis of SARS-CoV-2 by FDA under an Emergency Use Authorization (EUA).  This EUA will remain in effect (meaning this test can be used) for the duration of the COVID-19 declaration under Section 564(b)(1) of the Act, 21 U.S.C. section 360bbb-3(b)(1), unless the authorization is terminated or revoked sooner.  Performed at [facility], [place] [address], [place], [place] [address]      Labs: BNP (last 3 results) No results for input(s): BNP in the last 8760 hours. Basic Metabolic Panel: Recent Labs  Lab 05/08/20 0828 05/09/20 0653 05/09/20 1019 05/10/20 0614 05/11/20 1043 05/12/20 0750  NA 140 140  --  142 139 137  K 4.8 5.4*  --  5.2* 4.7 4.2  CL 109 110  --  112* 108 108  CO2 21* 20*  --  20* 21* 20*  GLUCOSE 132* 118*  --  125* 170* 99  BUN 28* 30*  --  29* 24* 26*  CREATININE 2.24* 2.07*  --  1.98* 1.84* 2.11*  CALCIUM 7.8* 7.7*  --  8.3* 8.0* 7.8*  MG  --   --  2.1  --   --   --    Liver Function Tests: No results for input(s): AST, ALT, ALKPHOS, BILITOT, PROT,  ALBUMIN in the last 168 hours. No results for input(s): LIPASE, AMYLASE in the last 168 hours. No results for input(s): AMMONIA in the last 168 hours. CBC: Recent Labs  Lab 05/06/20 1855 05/07/20 1649 05/08/20 0828 05/09/20 0653 05/10/20 0614 05/11/20 1043 05/12/20 0532  WBC 9.9   < > 6.1 6.5 11.6* 10.1 8.8  NEUTROABS 5.7  --   --  4.1 9.1* 7.5 5.3  HGB 9.0*   < > 8.5* 7.8* 8.7* 8.1* 7.9*  HCT 27.3*   < > 27.6*  26.1* 27.9* 26.1* 25.2*  MCV 101.9*   < > 104.2* 106.1* 104.1* 107.0* 105.4*  PLT 175   < > 181 183 248 219 211   < > = values in this interval not displayed.   Cardiac Enzymes: No results for input(s): CKTOTAL, CKMB, CKMBINDEX, TROPONINI in the last 168 hours. BNP: Invalid input(s): POCBNP CBG: Recent Labs  Lab 05/11/20 0752 05/11/20 1151 05/11/20 1630 05/11/20 2249 05/12/20 0755  GLUCAP 105* 179* 120* 128* 96   D-Dimer No results for input(s): DDIMER in the last 72 hours. Hgb A1c No results for input(s): HGBA1C in the last 72 hours. Lipid Profile No results for input(s): CHOL, HDL, LDLCALC, TRIG, CHOLHDL, LDLDIRECT in the last 72 hours. Thyroid function studies No results for input(s): TSH, T4TOTAL, T3FREE, THYROIDAB in the last 72 hours.  Invalid input(s): FREET3 Anemia work up No results for input(s): VITAMINB12, FOLATE, FERRITIN, TIBC, IRON, RETICCTPCT in the last 72 hours. Urinalysis    Component Value Date/Time   COLORURINE YELLOW 05/02/2020 2240   APPEARANCEUR CLEAR 05/02/2020 2240   LABSPEC <1.005 (L) 05/02/2020 2240   PHURINE 5.0 05/02/2020 2240   GLUCOSEU NEGATIVE 05/02/2020 2240   HGBUR NEGATIVE 05/02/2020 2240   BILIRUBINUR NEGATIVE 05/02/2020 2240   KETONESUR NEGATIVE 05/02/2020 2240   PROTEINUR NEGATIVE 05/02/2020 2240   UROBILINOGEN 0.2 01/05/2015 1825   NITRITE NEGATIVE 05/02/2020 2240   LEUKOCYTESUR NEGATIVE 05/02/2020 2240   Sepsis Labs Invalid input(s): PROCALCITONIN,  WBC,  LACTICIDVEN Microbiology Recent Results (from the  past 240 hour(s))  SARS Coronavirus 2 by RT PCR (hospital order, performed in [facility] hospital lab) Nasopharyngeal Nasopharyngeal Swab     Status: None   Collection Time: 05/02/20  7:28 PM   Specimen: Nasopharyngeal Swab  Result Value Ref Range Status   SARS Coronavirus 2 NEGATIVE NEGATIVE Final    Comment: (NOTE) SARS-CoV-2 target nucleic acids are NOT DETECTED.  The SARS-CoV-2 RNA is generally detectable in upper and lower respiratory specimens during the acute phase of infection. The lowest concentration of SARS-CoV-2 viral copies this assay can detect is 250 copies / mL. A negative result does not preclude SARS-CoV-2 infection and should not be used as the sole basis for treatment or other patient management decisions.  A negative result may occur with improper specimen collection / handling, submission of specimen other than nasopharyngeal swab, presence of viral mutation(s) within the areas targeted by this assay, and inadequate number of viral copies (<250 copies / mL). A negative result must be combined with clinical observations, patient history, and epidemiological information.  Fact Sheet for Patients:   StrictlyIdeas.no  Fact Sheet for Healthcare Providers: BankingDealers.co.za  This test is not yet approved or  cleared by the [place] FDA and has been authorized for detection and/or diagnosis of SARS-CoV-2 by FDA under an Emergency Use Authorization (EUA).  This EUA will remain in effect (meaning this test can be used) for the duration of the COVID-19 declaration under Section 564(b)(1) of the Act, 21 U.S.C. section 360bbb-3(b)(1), unless the authorization is terminated or revoked sooner.  Performed at [facility], [place] [place], [place] [address]   MRSA PCR Screening     Status: None   Collection Time: 05/02/20 11:40 PM   Specimen: Nasal Mucosa; Nasopharyngeal  Result Value Ref Range  Status   MRSA by PCR NEGATIVE NEGATIVE Final    Comment:        The GeneXpert MRSA Assay (FDA approved for NASAL specimens only), is  one component of a comprehensive MRSA colonization surveillance program. It is not intended to diagnose MRSA infection nor to guide or monitor treatment for MRSA infections. Performed at [facility], [place] [address] [place], [place] [address]   SARS Coronavirus 2 by RT PCR (hospital order, performed in [facility] hospital lab) Nasopharyngeal Nasopharyngeal Swab     Status: None   Collection Time: 05/12/20  7:40 AM   Specimen: Nasopharyngeal Swab  Result Value Ref Range Status   SARS Coronavirus 2 NEGATIVE NEGATIVE Final    Comment: (NOTE) SARS-CoV-2 target nucleic acids are NOT DETECTED.  The SARS-CoV-2 RNA is generally detectable in upper and lower respiratory specimens during the acute phase of infection. The lowest concentration of SARS-CoV-2 viral copies this assay can detect is 250 copies / mL. A negative result does not preclude SARS-CoV-2 infection and should not be used as the sole basis for treatment or other patient management decisions.  A negative result may occur with improper specimen collection / handling, submission of specimen other than nasopharyngeal swab, presence of viral mutation(s) within the areas targeted by this assay, and inadequate number of viral copies (<250 copies / mL). A negative result must be combined with clinical observations, patient history, and epidemiological information.  Fact Sheet for Patients:   StrictlyIdeas.no  Fact Sheet for Healthcare Providers: BankingDealers.co.za  This test is not yet approved or  cleared by the [place] FDA and has been authorized for detection and/or diagnosis of SARS-CoV-2 by FDA under an Emergency Use Authorization (EUA).  This EUA will remain in effect (meaning this test can be used) for the duration of  the COVID-19 declaration under Section 564(b)(1) of the Act, 21 U.S.C. section 360bbb-3(b)(1), unless the authorization is terminated or revoked sooner.  Performed at [facility], [place] [address], [place], [place] [address]      Time coordinating discharge: Over 30 minutes  SIGNED:   Darliss Cheney, MD  [facility] 05/12/2020, 10:44 AM  If 7PM-7AM, please contact night-coverage [email or web address]

## 2022-03-05 IMAGING — CT CT MAXILLOFACIAL W/O CM
3 of 5 series · 15 of 47 positions shown, 18 images · non-contrast
Comparison: CT head 01/21/2020 CT maxillofacial 08/04/2018.

CLINICAL DATA: Fall.  Hit head on plaster wall.

EXAM:
CT HEAD WITHOUT CONTRAST
CT MAXILLOFACIAL WITHOUT CONTRAST
CT CERVICAL SPINE WITHOUT CONTRAST
TECHNIQUE: Multidetector CT imaging of the head, cervical spine, and
maxillofacial structures were performed using the standard protocol
without intravenous contrast. Multiplanar CT image reconstructions
of the cervical spine and maxillofacial structures were also
generated.

[Series 2: max soft · axial · 0.35mm/px · z∈[+1230,+1346]mm · 11 of 68 slices shown, 14 images]
[im 5/68  brain]
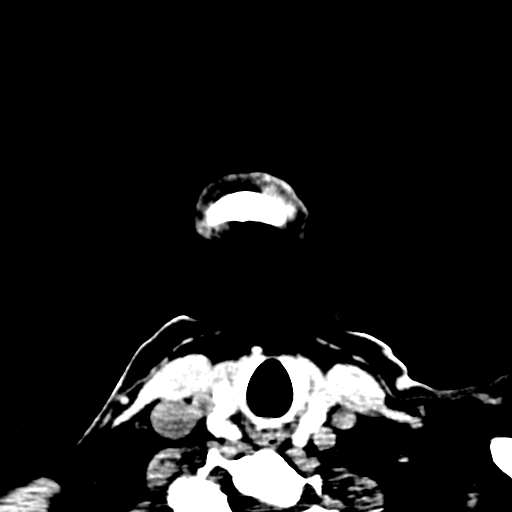
[im 5/68  bone]
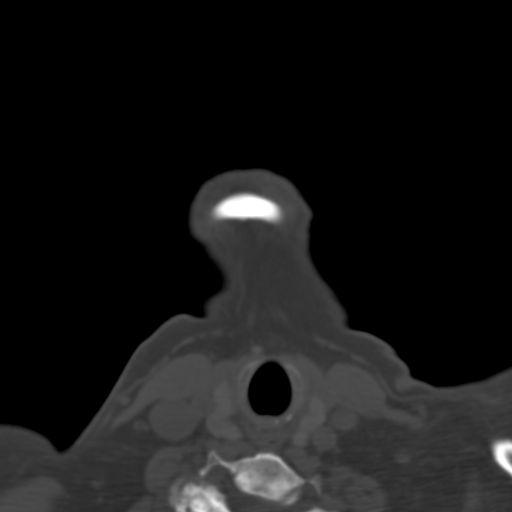
[im 10/68  bone]
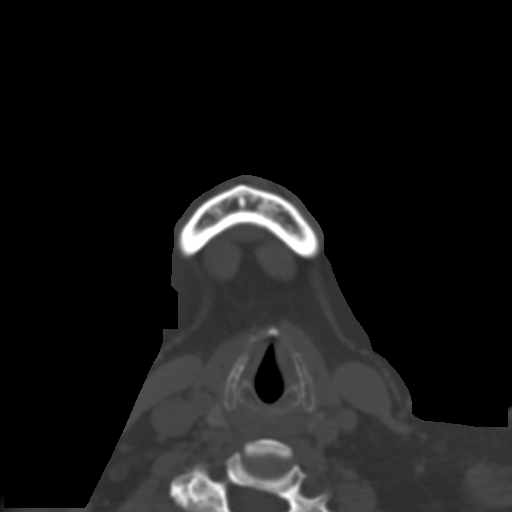
[im 17/68  bone]
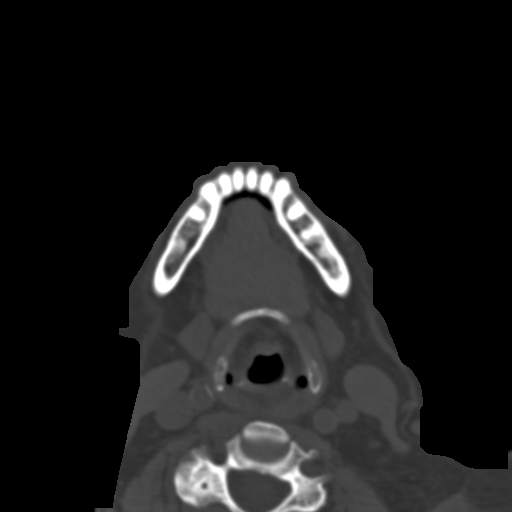
[im 21/68  bone]
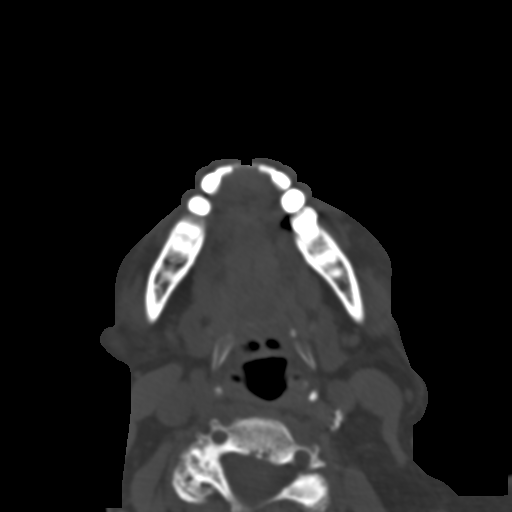
[im 28/68  brain]
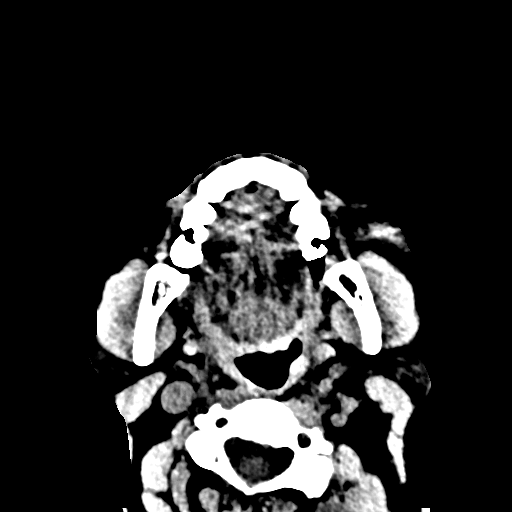
[im 28/68  bone]
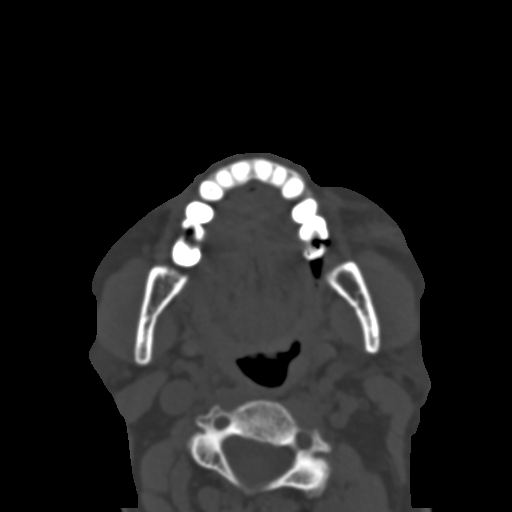
[im 35/68  bone]
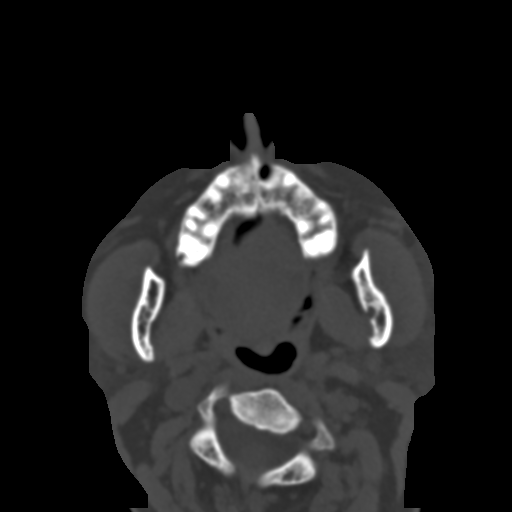
[im 40/68  bone]
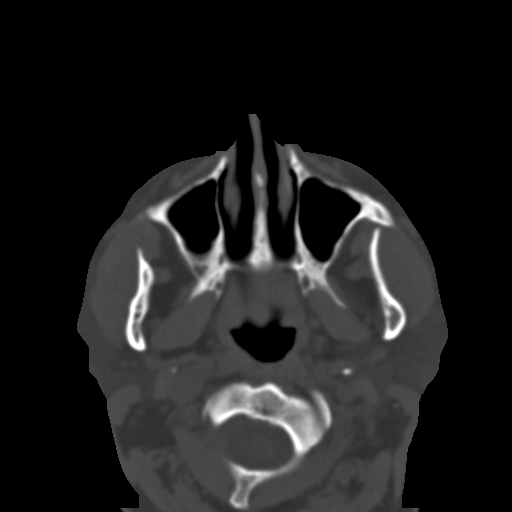
[im 47/68  bone]
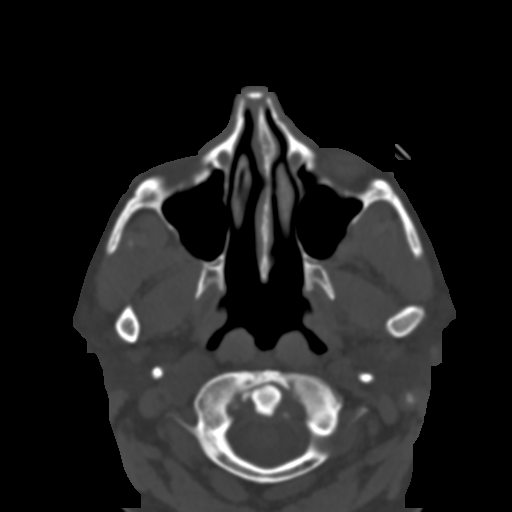
[im 51/68  brain]
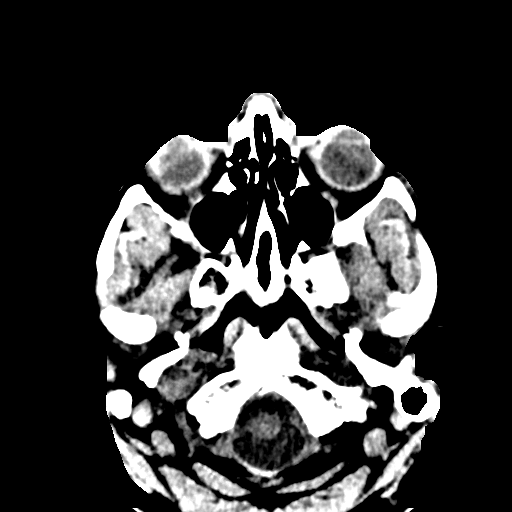
[im 51/68  bone]
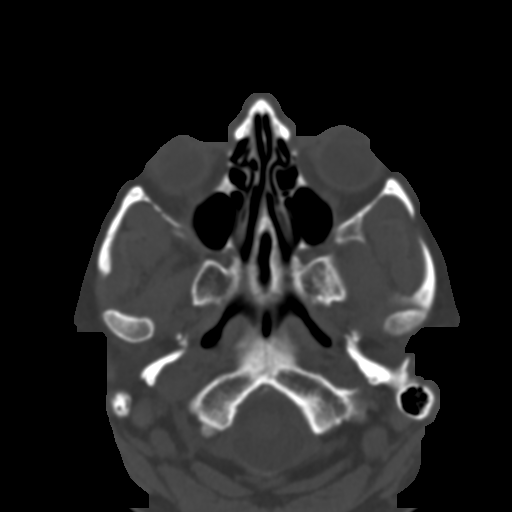
[im 58/68  bone]
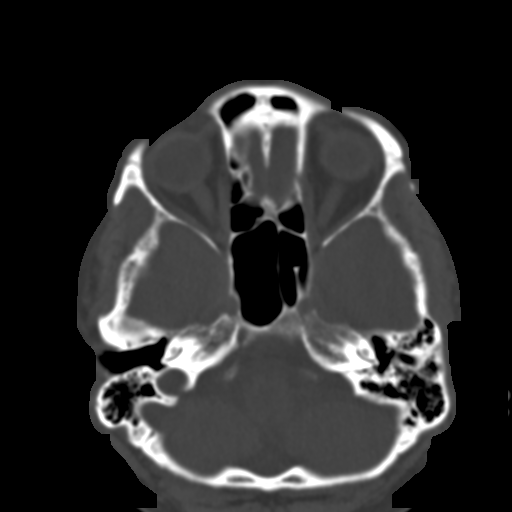
[im 63/68  bone]
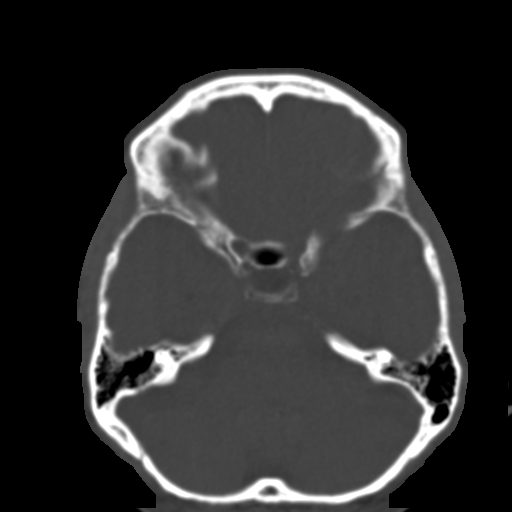

[Series 6: coronal bone · coronal · 0.37mm/px · 3 of 79 slices shown]
[im 20/79  bone]
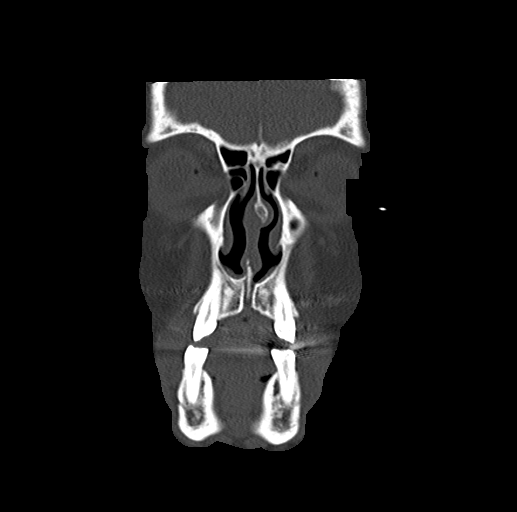
[im 40/79  bone]
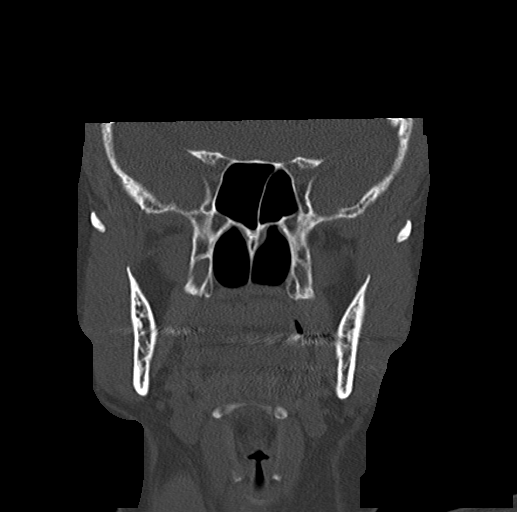
[im 59/79  bone]
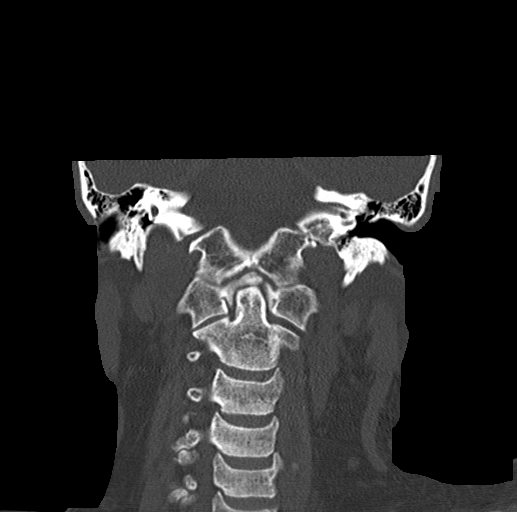

[Series 7: sagittal bone · sagittal · 0.31mm/px · 1 of 66 slices shown]
[im 33/66  bone]
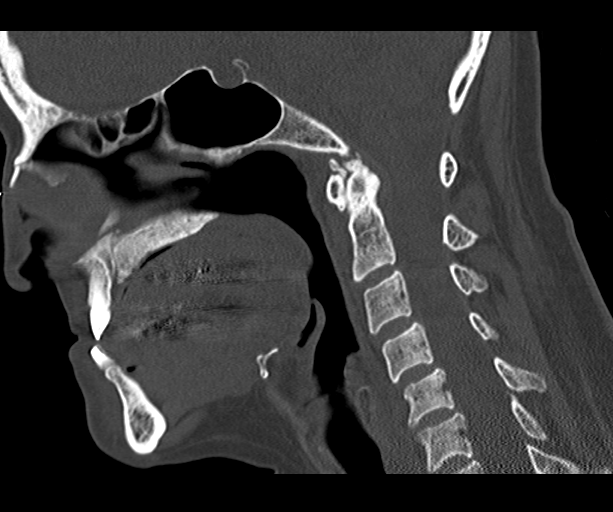

[15 of 47 positions shown; findings below may reference images not displayed]

FINDINGS: CT HEAD FINDINGS

Brain: There are acute bilateral subdural hematomas which extend
along the tentorium, right greater than left. On the right this
measures up to 6 mm in thickness. On the left this measures
approximately 3 mm in maximum thickness.

No signs of acute brain infarct or mass. Ventricular volumes are
within normal limits.

Vascular: No hyperdense vessel or unexpected calcification.

Skull: Normal. Negative for fracture or focal lesion.

Other: Right posterior parietal scalp laceration and hematoma is
noted, image 62/3.

CT MAXILLOFACIAL FINDINGS

Osseous: Nondisplaced bilateral nasal bone fractures, image 48/3. No
additional fractures identified.

Orbits: Negative. No traumatic or inflammatory finding.

Sinuses: Clear

Soft tissues: Negative.

CT CERVICAL SPINE FINDINGS

Alignment: Alignment of the cervical spine appears normal. The facet
joints appear well aligned.

Skull base and vertebrae: No acute fracture. No primary bone lesion
or focal pathologic process.

Soft tissues and spinal canal: No prevertebral fluid or swelling. No
visible canal hematoma.

Disc levels:  Within normal limits.

Upper chest: Negative.

Other: None
IMPRESSION: 1. Acute bilateral subdural hematomas which extend along the
tentorium, right greater than left.
2. Right posterior parietal scalp laceration and hematoma.
3. Nondisplaced bilateral nasal bone fractures.
4. No evidence for cervical spine fracture or dislocation.
5. Critical Value/emergent results were called by telephone at the
time of interpretation on 05/02/2020 at [DATE] to provider Dr.
Curt, Who verbally acknowledged these results.

## 2022-03-11 IMAGING — DX DG ABD PORTABLE 1V
1 series · 1 of 1 positions shown · non-contrast
Comparison: None.

CLINICAL DATA: Abdominal pain

EXAM:
PORTABLE ABDOMEN - 1 VIEW

[abdomen kub]
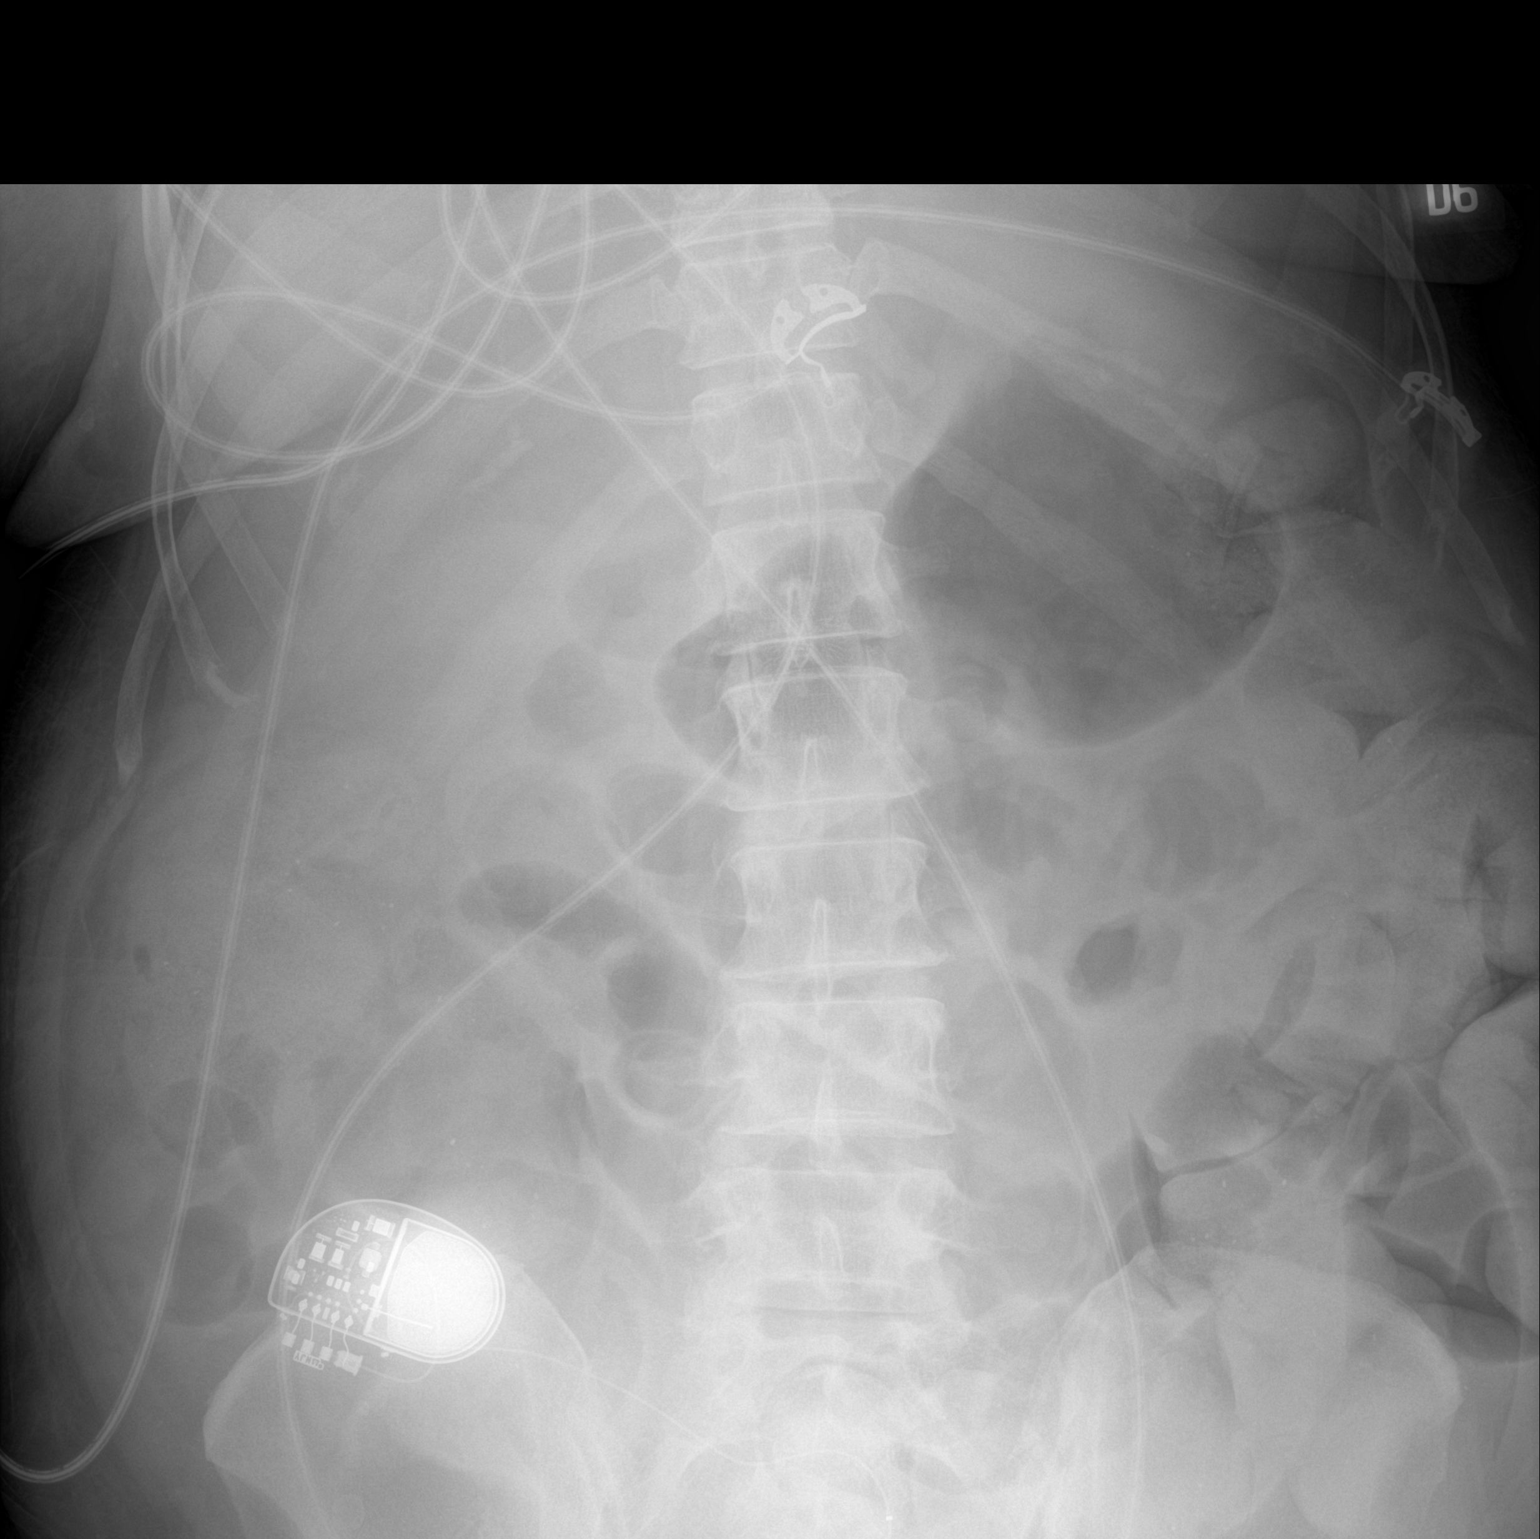

[1 of 1 positions shown; findings below may reference images not displayed]

FINDINGS: Spinal stimulator overlies the right lower quadrant with lead
coursing into the sacrum with tip not seen on this radiograph.
Mildly dilated central abdominal small bowel loops up to 3.0 cm
diameter. Mild gastric gaseous distention. Moderate colonic stool.
No evidence of pneumatosis or pneumoperitoneum. No radiopaque
nephrolithiasis.
IMPRESSION: Mildly dilated central abdominal small bowel loops, cannot exclude
ileus or mid to distal small bowel obstruction. Mild gastric gaseous
distention. Moderate colonic stool. CT abdomen/pelvis with oral and
IV contrast may be obtained for further evaluation as clinically
warranted.

## 2022-03-15 IMAGING — CR DG ABDOMEN 2V
2 series · 2 of 2 positions shown · non-contrast
Comparison: May 09, 2019

CLINICAL DATA: Ileus

EXAM:
ABDOMEN - 2 VIEW

[abdomen erect]
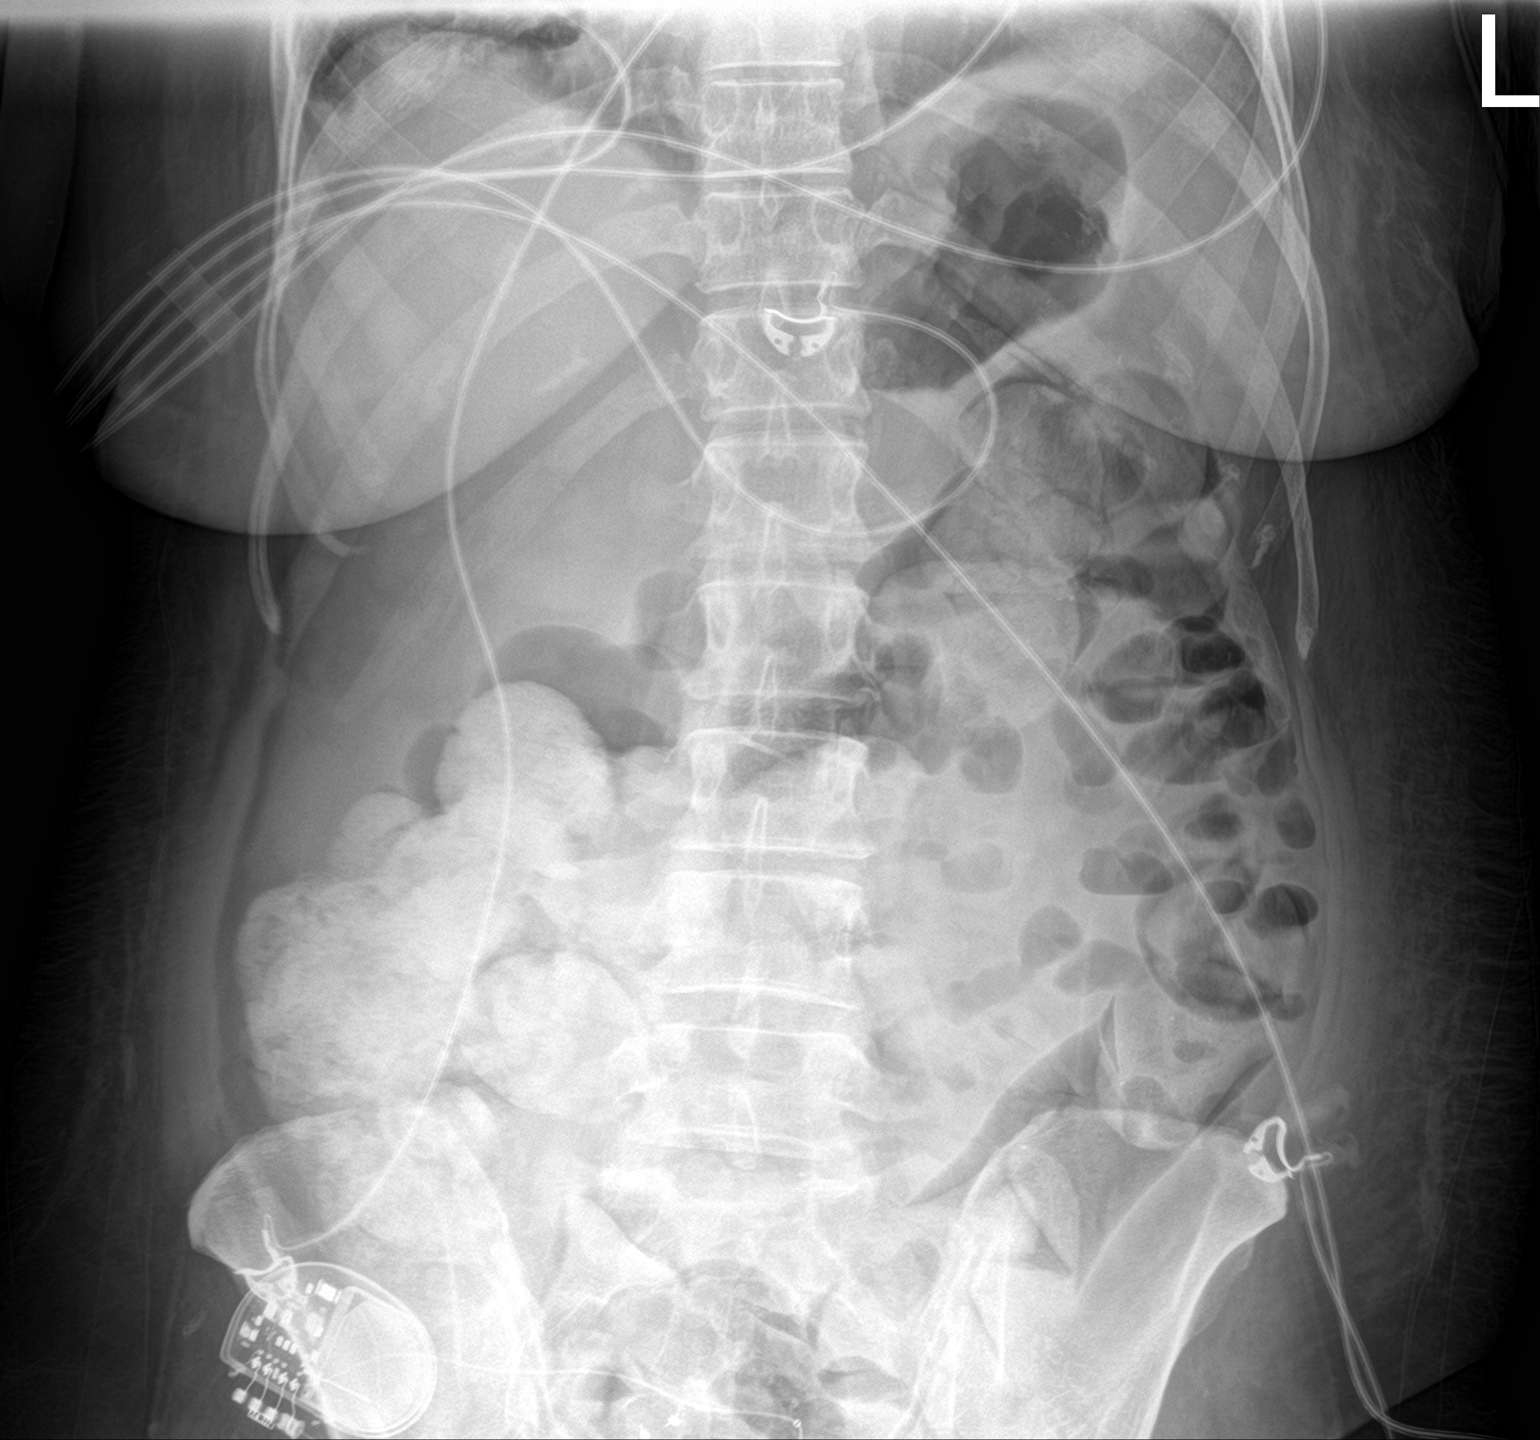

[abdomen supine]
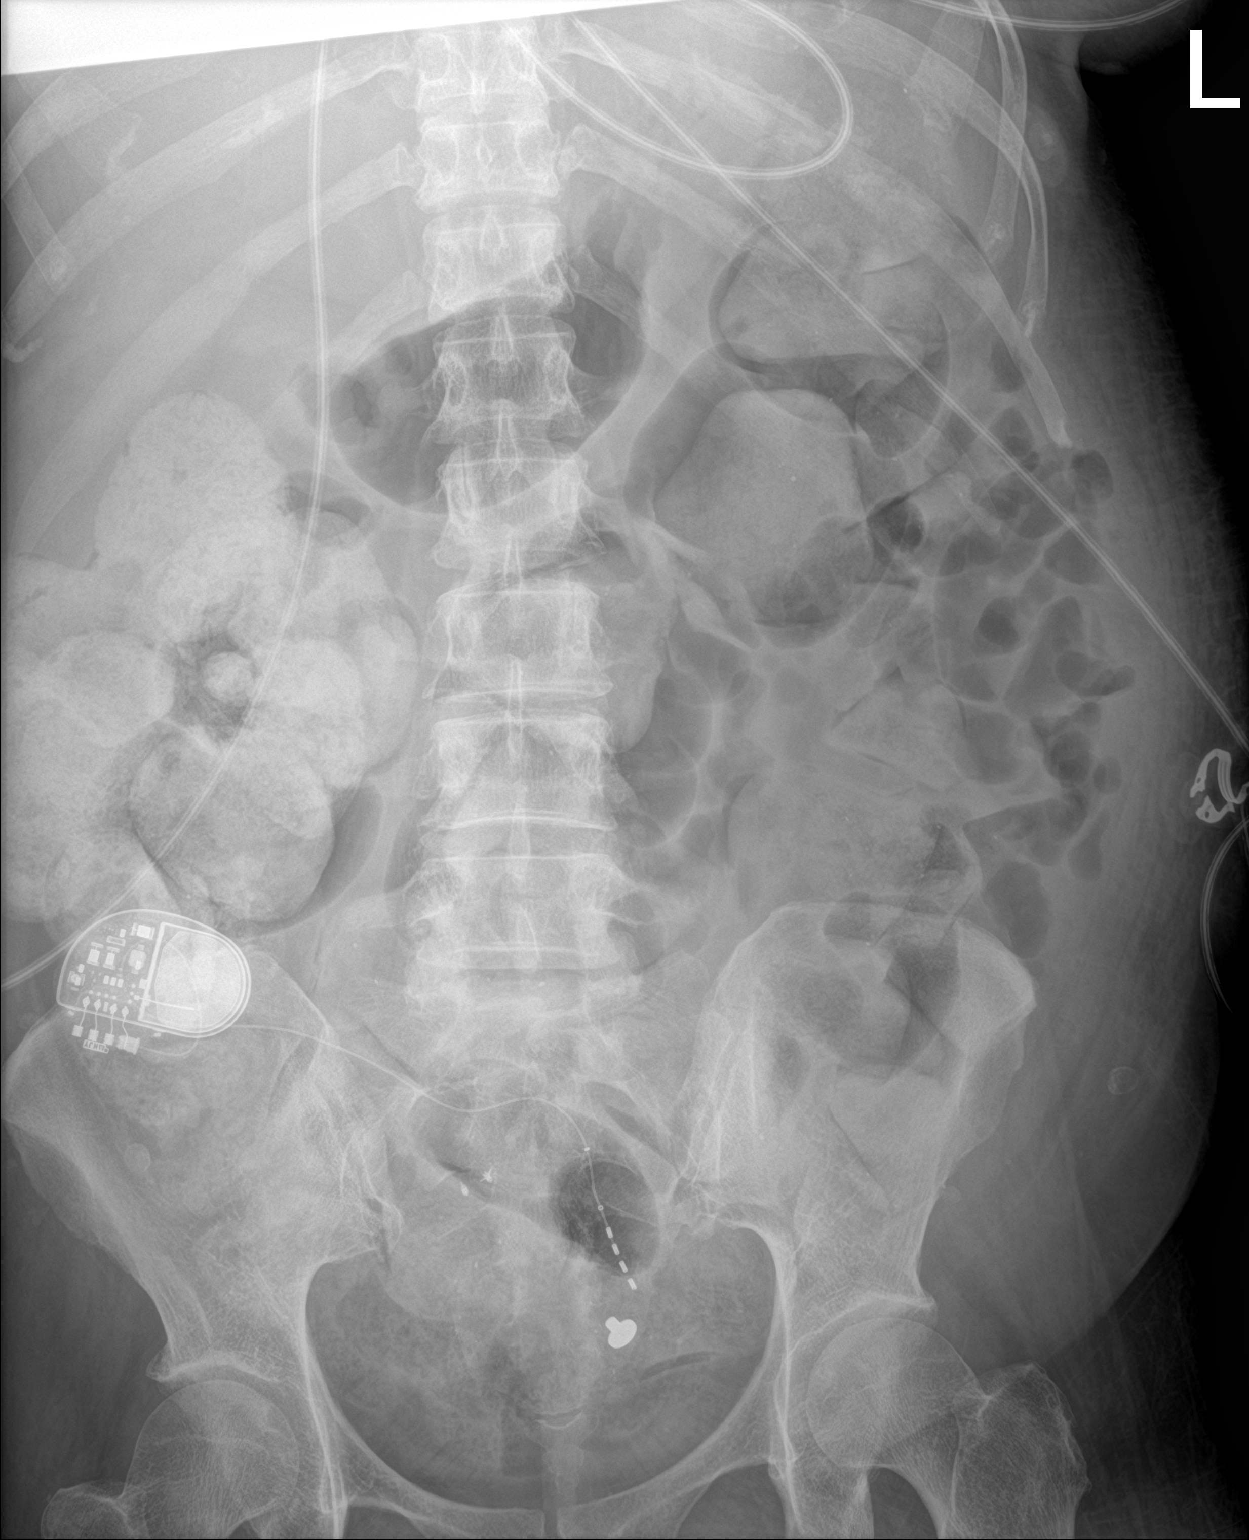

[2 of 2 positions shown; findings below may reference images not displayed]

FINDINGS: Air and stool-filled nondilated loops of bowel. Moderate colonic
stool burden predominately within the RIGHT colon. Enteric contrast
has progressed to the RIGHT hemicolon. A metallic screw projects
over the pelvis; it was previously within the ascending colon on CT
dated May 08, 2020. Visualized lung bases are unremarkable.
Spinal stimulator.
IMPRESSION: 1. No evidence of bowel obstruction. Moderate colonic stool burden
predominately within the RIGHT colon. Enteric contrast has
progressed to the RIGHT hemicolon.

2. A metallic screw projects over the pelvis; it was previously
within the ascending colon on CT dated May 08, 2020. Correlate
with patient ingestion history.

## 2024-08-11 NOTE — Progress Notes (Signed)
 Retina Clinic Note 08/11/2024        CHIEF COMPLAINT Patient presents for Retina Evaluation   HISTORY OF PRESENT ILLNESS: Tina Ramos is a 68 y.o. female who presents to the clinic today for: new patient evaluation for EX AMD OD  HPI   Pt here today for AMD eval per Dr. Janit. (Reestablish care with retina) L/v Dr. Dyane No hx of injections.   Pt states va fluctuates.  Started taking AREDS PO 2 weeks ago. (Very costly per pt) Monitors Amsler grid daily and reports no changes.  Never a smoker  Past occupation: international aid/development worker. Retired. No pain or discomfort with both eyes. Using Blink gtts both eyes QAM.   AODM x 20+ yrs. Diet controlled. Unsure of most recent A1C. Not in EMR  Lab Results      Component                Value               Date                      HGBA1C                   6.1 (H)             08/28/2023             Last edited by Alan Lyle Faster, COT on 08/11/2024  8:28 AM.      HISTORICAL INFORMATION:   Selected notes from the MEDICAL RECORD NUMBER    CURRENT MEDICATIONS: Medications Ordered Prior to Encounter[1]  Referring physician: Ozell Lemond Janit, MD [address] [place],  [place] [address]  REVIEW OF SYSTEMS:  ALLERGIES Allergies[2]  PAST MEDICAL HISTORY Medical History[3] Surgical History[4]  FAMILY HISTORY Family History[5]  SOCIAL HISTORY Social History[6]      GENERAL EXAM: General Exam: Neuro: Alert and Oriented x 3, normal mood and affect   OPHTHALMIC EXAM:  Base Eye Exam     Visual Acuity (Snellen - Linear)       Right Left   Dist [place] CF@2ft  20/40+2   Dist ph [place] NI 20/25+2         Tonometry (Applanation, 8:37 AM)       Right Left   Pressure 11 9         Pupils       Pupils Dark Light React APD   Right PERRL 2 1 Minimal None   Left PERRL 2 1 Minimal None  Miotic. Hx of Flomax use for yrs. None currently        Visual Fields (Counting fingers)       Left Right     Full    Restrictions Partial inner superior temporal, inferior temporal, superior nasal, inferior nasal deficiencies          Neuro/Psych     Oriented x3: Yes   Mood/Affect: Normal         Dilation     Both eyes: 2.5% Phenylephrine , 1.0% Tropicamide @ 8:38 AM           Slit Lamp and Fundus Exam     External Exam       Right Left   External Normal Normal         Slit Lamp Exam       Right Left   Lids/Lashes dermatochalasis Normal   Conjunctiva/Sclera White and quiet White and quiet  Cornea Clear Clear   Anterior Chamber Deep and quiet Deep and quiet   Iris Dilated Dilated   Lens PCIOL PCIOL   Anterior Vitreous PVD PVD         Fundus Exam       Right Left   Disc Normal Normal   C/D Ratio 0.25 0.3   Macula Central scar, no heme Pigment spot temp side of the fovea, no heme   Vessels Arteriolar narrowing Arteriolar narrowing   Periphery Normal Normal           Refraction     Wearing Rx     Type: None            IMAGING AND PROCEDURES  Imaging and Procedures for 08/11/2024  OCT, Macula - OU - Both Eyes       Optical Coherence Tomography  Test Date: 08/11/2024.   Right Eye  Central Foveal Thickness: 400   Left Eye  Central Foveal Thickness: 233   Notes OD new disciform scar, ERM, no SRF, 1 cystic change, worse from 2023 OS lesion at fovea, no IRF/SRF, rpe migration, stable from 2023             ASSESSMENT/PLAN:  1. 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 Intermediate stage nonexudative age-related macular degeneration of left eye        Ophthalmic Meds Ordered this visit:  No orders of the defined types were placed in this encounter.    Exudative age related macular degeneration right eye -The incidence pathology and anatomy of wet AMD discussed with patient including treatment options with intravitreal anti VEGF agents like Lucentis,  Avastin, Eylea, and Vabysmo - Intravitreal injection risks of endophthalmitis and vascular occlusive events and atrophic changes discussed with patient - Last injection never - 08/11/24: Today, vision CF from baseline of 20/40 in 2023 at last visit with Dr. Dyane, and OCT shows large central scar new from last exam, no heme and no IRF  Recommend observation today  Guarded visual prognosis discussed now that scar has formed  Nonexudative age related macular degeneration left eye  -monitor closely since patient is now monocular -AREDS -amsler grid -smoking cessation/avoidance discussed  RTC 4-5 weeks for repeat exam       No follow-ups on file.  There are no Patient Instructions on file for this visit.   Explained the diagnoses, plan, and follow up with the patient and they expressed understanding.  Patient expressed understanding of the importance of proper follow up care.    Abbreviations: M myopia (nearsighted); A astigmatism; H hyperopia (farsighted); P presbyopia; Mrx spectacle prescription;  CTL contact lenses; OD right eye; OS left eye; OU both eyes  XT exotropia; ET esotropia; PEK punctate epithelial keratitis; PEE punctate epithelial erosions; DES dry eye syndrome; MGD meibomian gland dysfunction; ATs artificial tears; PFAT's preservative free artificial tears; NSC nuclear sclerotic cataract; PSC posterior subcapsular cataract; ERM epi-retinal membrane; PVD posterior vitreous detachment; RD retinal detachment; DM diabetes mellitus; DR diabetic retinopathy; NPDR non-proliferative diabetic retinopathy; PDR proliferative diabetic retinopathy; CSME clinically significant macular edema; DME diabetic macular edema; dbh dot blot hemorrhages; CWS cotton wool spot; POAG primary open angle glaucoma; C/D cup-to-disc ratio; HVF humphrey visual field; GVF goldmann visual field; OCT optical coherence tomography; IOP intraocular pressure; BRVO Branch retinal vein occlusion; CRVO central retinal  vein occlusion; CRAO central retinal artery occlusion; BRAO branch retinal artery  occlusion; RT retinal tear; SB scleral buckle; PPV pars plana vitrectomy; VH Vitreous hemorrhage; PRP panretinal laser photocoagulation; IVK intravitreal kenalog; VMT vitreomacular traction; MH Macular hole;  NVD neovascularization of the disc; NVE neovascularization elsewhere; AREDS age related eye disease study; ARMD age related macular degeneration; POAG primary open angle glaucoma; EBMD epithelial/anterior basement membrane dystrophy; ACIOL anterior chamber intraocular lens; IOL intraocular lens; PCIOL posterior chamber intraocular lens; Phaco/IOL phacoemulsification with intraocular lens placement; PRK photorefractive keratectomy; LASIK laser assisted in situ keratomileusis; HTN hypertension; DM diabetes mellitus; COPD chronic obstructive pulmonary disease  Electronically signed by: Josette Tess Lot, MD 08/11/2024 9:13 AM     Josette RAMAN. Lot, MD Assistant Professor of Ophthalmology  Vitreoretinal Diseases and Surgery [facility]       [1] Current Outpatient Medications on File Prior to Visit  Medication Sig Dispense Refill  . atorvastatin  (LIPITOR) 20 mg tablet Take 1 tablet (20 mg total) by mouth daily. 90 tablet 1  . estradioL (ESTRACE) 0.01 % (0.1 mg/gram) vaginal cream DISCARD APPLICATOR. INSERT A BLUEBERRY SIZE AMOUNT (APPROXIMATELY 1 GRAM) OF CREAM ON FINGERTIP INSIDE VAGINA NIGHTLY AT BEDTIME FOR 2 WEEKS. THEN EVERY OTHER NIGHT 3 NIGHTS PER WEEK FOR LONG TERM USE 42.5 g 3  . estradioL (VIVELLE-DOT) 0.1 mg/24 hr apply one PATCH onto THE SKIN ON monday AND thursday AT bedtime 24 patch 3  . famotidine (PEPCID) 20 mg tablet TAKE 1 TABLET BY MOUTH TWICE DAILY 180 tablet 3  . gabapentin  (NEURONTIN ) 300 mg capsule TAKE 1 CAPSULE BY MOUTH TWICE DAILY 60 capsule 5  . labetaloL  (NORMODYNE ) 100 mg tablet Take 100 mg by mouth 2 (two) times a day.    . lamoTRIgine (LaMICtal)  100 mg tablet Take 1 at night 30 tablet 2  . lemborexant (Dayvigo) 10 mg tablet Take 1 tab at night for sleep 30 tablet 2  . lidocaine  (XYLOCAINE ) 5 % ointment Apply topically as needed for mild pain (1-3). 30 g PRN  . ondansetron  (ZOFRAN -ODT) 4 mg disintegrating tablet Dissolve 1 tablet (4 mg total) on tongue every 8 (eight) hours as needed for nausea or vomiting. 20 tablet 3  . prucalopride (Motegrity) 1 mg tablet Take 1 tablet (1 mg total) by mouth daily. 90 tablet 3  . QUEtiapine  (SEROquel ) 300 mg tablet Take 1 at night 30 tablet 2  . RABEprazole (ACIPHEX) 20 mg DR tablet Take 1 tablet (20 mg total) by mouth before breakfast and before evening meal. 180 tablet 3  . ramelteon (ROZEREM) 8 mg tablet for sleep    . rOPINIRole (REQUIP) 1 mg tablet Take 1 tab twice a day 60 tablet 2  . sodium bicarbonate 650 mg tablet Take 650 mg by mouth 3 (three) times a day.    . tolterodine (DETROL LA) 4 mg ER capsule TAKE 1 CAPSULE BY MOUTH ONCE DAILY 30 capsule 11  . torsemide  (DEMADEX ) 20 mg tablet Take 1 tablet by mouth daily.    . tamsulosin (FLOMAX) 0.4 mg cap Take 0.4 mg by mouth daily. (Patient not taking: Reported on 08/11/2024)    . [DISCONTINUED] fluconazole (DIFLUCAN) 150 mg tablet Take 1 tablet po x 1 dose. Repeat in 3 days if needed. 2 tablet 0  . [DISCONTINUED] promethazine  (PHENERGAN ) 25 mg tablet TAKE 1 TABLET BY MOUTH EVERY 6 HOURS AS NEEDED FOR NAUSEA 90 tablet 0  . [DISCONTINUED] silver sulfADIAZINE (SILVADENE) 1 % cream Apply topically 2 (two) times a day. 20 g 4   No current facility-administered medications  on file prior to visit.  [2] Allergies Allergen Reactions  . Latex Itching and Rash  . Pentazocine Hives, Itching, Other (See Comments) and Rash    Other Reaction: OTHER REACTION  . Propoxyphene Other (See Comments) and Itching    Doesn't work per patient  . Sulfa (Sulfonamide Antibiotics) Swelling, Hives, Itching and Rash  . Propoxyphene N-Acetaminophen  Other (See Comments)     Doesn't work per patient  . Rifaximin  Hives, Itching and Angioedema  . Vitamin C And E Other (See Comments)    Pt states she cannot take this because of her kidneys   . Codeine Itching and Rash  . Pentazocine Lactate Itching and Rash  [3] Past Medical History: Diagnosis Date  . ADHD (attention deficit hyperactivity disorder)   . Allergic rhinitis   . Anemia   . Anxiety and depression   . Arthritis   . At risk for falls   . Bipolar disorder (CMD)   . Bunion 04/10/2016  . Cervicalgia 12/02/2015  . CKD (chronic kidney disease), stage IV    (CMD)   . Diabetes mellitus (CMD)   . Fall 11/15/2013  . Fatty liver   . Gastroparesis due to DM    (CMD) 12/13/2015  . GERD (gastroesophageal reflux disease)   . Glaucoma   . Hepatitis   . Hiatal hernia   . History of diabetic ulcer of foot 08/10/2018  . Hyperlipidemia 12/09/2015   Overview:  using statin Overview:  Overview:  using statin  . Hypertension   . Hypertrophy of bone, right ankle and foot 04/27/2016  . Imbalance 04/18/2016  . Kidney failure    stage 5  . 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 Macular degeneration    BILATERAL   . Memory changes 01/29/2014  . Meniere disease, left 01/29/2017  . Migraine without status migrainosus, not intractable 12/02/2015  . NASH (nonalcoholic steatohepatitis) 06/08/2016   Overview:  Biopsy diagnosis-Stage II fibrosis  . Orthostatic hypotension 03/16/2016  . OSA (obstructive sleep apnea) 02/21/2013   NO CPAP; COULD NOT TOLERATE  . Osteoporosis   . 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 Portal venous hypertension    (CMD) 06/08/2016   Overview:  splenomegaly; gastric varix on EGD-2012  . Pre-syncope 08/13/2018  . Proteinuria 12/09/2015  . Restless legs syndrome 02/21/2013  . S/P implantation of urinary electronic stimulator device   . Shortness of breath on exertion   . Urinary retention 07/07/2012  . Urinary tract  infection   . Visual impairment   [4] Past Surgical History: Procedure Laterality Date  . APPENDECTOMY     Procedure: APPENDECTOMY  . AV FISTULA PLACEMENT Left 02/05/2018   Procedure: A-V FISTULA;  Surgeon: Okey Kennyth Moats, MD;  Location: [facility] MAIN OR;  Service: Vascular;  Laterality: Left;  . BLADDER SURGERY Right 02/26/2023   IMPLANT/EXCHANGE INTERSTIM FULL (MEDTRONICS) performed by Lamar JINNY Sar, MD at [facility] OR  . CATARACT EXTRACTION Left 07/21/2020   Procedure: YAG CAPSULOTOMY; MWE  . CATARACT EXTRACTION W/  INTRAOCULAR LENS IMPLANT Right 10/11/2016   Procedure: CATARACT EXTRACTION W/  INTRAOCULAR LENS IMPLANT; RJD: Model: [id number] Power: 23.5D [id number]  . CATARACT EXTRACTION W/  INTRAOCULAR LENS IMPLANT Left 11/23/2016   Procedure: CATARACT EXTRACTION W/  INTRAOCULAR LENS IMPLANT; SN60WF  23.5  [id number]  . COLONOSCOPY     Procedure: COLONOSCOPY  . CYSTOSTOMY W/ BLADDER DILATION     Procedure: BLADDER DILATION; multi  . EXPLORATORY LAPAROTOMY  Procedure: EXPLORATORY LAPAROTOMY  . FINGER AMPUTATION Left 06/22/2016   Procedure: AMPUTATION OF LEFT LONG FINGER;  Surgeon: Morene Levander Gavel, MD;  Location: [facility] MAIN OR;  Service: Orthopedics;  Laterality: Left;  . FOOT SURGERY Right    Procedure: FOOT SURGERY; bone removed 2/2 non-healing callus  . FOOT SURGERY Left 10/03/2021   Procedure: OPEN TREATMENT DISTAL TIBIOFIBULAR JOINT DISRUPTION;  Surgeon: Reena Nat Banner, MD;  Location: [facility] MAIN OR;  Service: Orthopedics;  Laterality: Left;  . FRACTURE SURGERY Left    Procedure: FRACTURE SURGERY; ARM FRACTURE , HARDWARE RETAINED   . HYSTERECTOMY      Procedure: HYSTERECTOMY  . INTERSTIM GENERATOR PLACEMENT  initially 2002   Procedure: INTERSTIM GENERATOR PLACEMENT; s/p multi revisions  . INTERSTIM REVISION Right 08/17/2016   Procedure: INTERSTIM REVISION- battery replecement;  Surgeon: Lamar Lynwood Sar, MD;  Location[facility] OUTPATIENT OR;  Service: Urology;   Laterality: Right;  no xray for generator placement.  Mix bacitracin with water.  No saline on the field.  SABRA JOINT REPLACEMENT    . LARYNGOPLASTY Bilateral 12/05/2020   Procedure: MEDIALIZATION LARYNGOPLASTY, laryngoplasty with gortex;  Surgeon: Mirna Anette Cohens, DO;  Location: [facility] OUTPATIENT OR;  Service: ENT;  Laterality: Bilateral;  . LARYNGOSCOPY Bilateral 07/25/2020   Procedure: SUSPENSION MICRO DIRECT LARYNGOSCOPY injection of prolaryn GEL, bilateral with focus on the LEFT cord;  Surgeon: Alan Idell Plant, MD;  Location: [facility] MAIN OR;  Service: ENT;  Laterality: Bilateral;  Pro Laryn GEL will be needed. Please contact rep.  SABRA NASAL SEPTUM SURGERY (AKA DEVIATED SEPTUM)     Procedure: NASAL SEPTUM SURGERY  . ORIF ANKLE FRACTURE Left 10/03/2021   Procedure: OPEN TREATMENT ANKLE FRACTURE;  Surgeon: Reena Nat Banner, MD;  Location: [facility] MAIN OR;  Service: Orthopedics;  Laterality: Left;  smith and nephew supine reg bed  . PERCUTANEOUS PINNING FINGER FRACTURE Left 05/08/2016   Procedure: PERCUTANEOUS FIXATION FINGER FRACTURE / DISLOCATION;  Surgeon: Morene Levander Gavel, MD;  Location: [facility] OUTPATIENT OR;  Service: Orthopedics;  Laterality: Left;  . SHOULDER SURGERY     Procedure: SHOULDER SURGERY  . TONSILLECTOMY     Procedure: TONSILLECTOMY  . UPPER GASTROINTESTINAL ENDOSCOPY     Procedure: UPPER GASTROINTESTINAL ENDOSCOPY  [5] Family History Problem Relation Name Age of Onset  . Macular degeneration Mother Valentin Neas   . Diabetes Mother Valentin Neas   . Colon polyps Mother Valentin Neas   . Cataracts Mother Valentin Neas   . Alzheimer's disease Mother Valentin Neas   . Depression Mother Valentin Neas   . Macular degeneration Father    . Colon polyps Father    . Cataracts Father    . Hypertension Maternal Grandmother Clara Neas   . Cancer Maternal Grandmother Valentin Neas   . Diabetes Maternal Grandmother Clara Neas   . Cataracts Maternal Grandmother Clara Neas   . Cataracts  Maternal Grandfather    . Cataracts Paternal Grandmother    . Stroke Paternal Grandfather V   . Cataracts Paternal Grandfather V   . Anesthesia problems Brother Garrel Neas        slow to wake up  . Breast cancer Maternal Aunt    . Cancer Maternal Aunt Dot Rumley   . Cancer Other    . Diabetes Other    . Breast cancer Paternal Grandparent    . Glaucoma Neg Hx    [6] Social History Tobacco Use  . Smoking status: Unknown    Passive exposure: Never  Vaping Use  .  Vaping status: Never Used  Substance Use Topics  . Alcohol use: No  . Drug use: No

## 2024-08-22 ENCOUNTER — Emergency Department (HOSPITAL_BASED_OUTPATIENT_CLINIC_OR_DEPARTMENT_OTHER)
Admission: EM | Admit: 2024-08-22 | Discharge: 2024-08-22 | Disposition: A | Discharge status: Home / Self Care | Attending: Emergency Medicine | Admitting: Emergency Medicine

## 2024-08-22 ENCOUNTER — Other Ambulatory Visit: Payer: Self-pay

## 2024-08-22 ENCOUNTER — Encounter (HOSPITAL_BASED_OUTPATIENT_CLINIC_OR_DEPARTMENT_OTHER): Payer: Self-pay | Admitting: Urology

## 2024-08-22 ENCOUNTER — Emergency Department (HOSPITAL_BASED_OUTPATIENT_CLINIC_OR_DEPARTMENT_OTHER)

## 2024-08-22 DIAGNOSIS — M21619 Bunion of unspecified foot: Secondary | ICD-10-CM

## 2024-08-22 DIAGNOSIS — M79672 Pain in left foot: Secondary | ICD-10-CM

## 2024-08-22 DIAGNOSIS — M21612 Bunion of left foot: Secondary | ICD-10-CM | POA: Insufficient documentation

## 2024-08-22 HISTORY — DX: End stage renal disease: N18.6

## 2024-08-22 HISTORY — DX: Type 2 diabetes mellitus with diabetic neuropathy, unspecified: E11.40

## 2024-08-22 NOTE — ED Provider Notes (Signed)
 " [facility]ERGENCY DEPARTMENT AT [facility] [place] Provider Note   CSN: [id number] Arrival date & time: 08/22/24  1333     Patient presents with: Foot Pain   Tina Ramos is a 68 y.o. female.   Patient is a 68 year old female presenting for foot pain.  Patient admits to left foot pain at the first MTP region.  Admits to thickening of the skin changes.  Denies any open wounds.  Denies any swelling, redness, or purulent drainage.  Denies any recent falls or injuries.  The history is provided by the patient. No language interpreter was used.  Foot Pain Pertinent negatives include no chest pain, no abdominal pain and no shortness of breath.       Prior to Admission medications   Medication Sig Start Date End Date Taking? Authorizing Provider  atorvastatin  (LIPITOR) 20 MG tablet Take 20 mg by mouth daily.    [provider]  desvenlafaxine (PRISTIQ) 50 MG 24 hr tablet Take 50 mg by mouth daily. 03/28/20   [provider]  estradiol (ESTRACE) 1 MG tablet Take 1 mg by mouth daily.    [provider]  gabapentin  (NEURONTIN ) 100 MG capsule Take 100 mg by mouth 3 (three) times daily.    [provider]  insulin  glargine (LANTUS) 100 UNIT/ML injection Inject 0-20 Units into the skin See admin instructions. Sliding scale: if BG is less than  < 150, 0 units and if > 150, 4 units. Pt increases by 1 unit per increase of 25 mmol/L in BG.    [provider]  mupirocin  cream (BACTROBAN ) 2 % Apply 1 application topically 2 (two) times daily. For 7 days Patient not taking: Reported on 05/03/2020 03/17/15   Lenor Hollering, MD  ondansetron  (ZOFRAN  ODT) 4 MG disintegrating tablet 4mg  ODT q4 hours prn nausea/vomit Patient not taking: Reported on 05/03/2020 10/02/18   Emil Share, DO  oxybutynin  (DITROPAN  XL) 15 MG 24 hr tablet Take 1 tablet by mouth daily as needed (overactive bladder).  05/02/20   [provider]  oxyCODONE  (OXY IR/ROXICODONE ) 5  MG immediate release tablet Take 1 pills every 4-6 hrs as needed for pain 05/07/20   McBane, Caroline N, PA-C  pantoprazole  (PROTONIX ) 40 MG tablet Take 40 mg by mouth daily.    [provider]  promethazine  (PHENERGAN ) 25 MG tablet Take 1 tablet (25 mg total) by mouth every 6 (six) hours as needed for nausea. Patient not taking: Reported on 05/03/2020 11/27/14   Charlyn Sora, MD  QUEtiapine  (SEROQUEL ) 100 MG tablet Take 300 mg by mouth at bedtime.     [provider]  rizatriptan (MAXALT) 10 MG tablet Take 10-20 mg by mouth daily as needed for migraine.     [provider]  sevelamer  carbonate (RENVELA ) 800 MG tablet Take 1,600 mg by mouth 3 (three) times daily with meals.     [provider]  torsemide  (DEMADEX ) 20 MG tablet Take 20 mg by mouth 3 (three) times a week. 04/15/20   [provider]    Allergies: Pentazocine, Propoxyphene, Sulfa antibiotics, Codeine, and Other    Review of Systems  Constitutional:  Negative for chills and fever.  HENT:  Negative for ear pain and sore throat.   Eyes:  Negative for pain and visual disturbance.  Respiratory:  Negative for cough and shortness of breath.   Cardiovascular:  Negative for chest pain and palpitations.  Gastrointestinal:  Negative for abdominal pain and vomiting.  Genitourinary:  Negative  for dysuria and hematuria.  Musculoskeletal:  Negative for arthralgias and back pain.  Skin:  Negative for color change and rash.  Neurological:  Negative for seizures and syncope.  All other systems reviewed and are negative.   Updated Vital Signs BP (!) 158/61 (BP Location: Right Arm)   Pulse 82   Temp 98.3 F (36.8 C)   Resp 18   Ht 5' 2 (1.575 m)   Wt 62 kg   SpO2 99%   BMI 25.00 kg/m   Physical Exam Vitals and nursing note reviewed.  Constitutional:      General: She is not in acute distress.    Appearance: She is well-developed.  HENT:     Head: Normocephalic and atraumatic.  Eyes:      Conjunctiva/sclera: Conjunctivae normal.  Cardiovascular:     Rate and Rhythm: Normal rate and regular rhythm.     Heart sounds: No murmur heard. Pulmonary:     Effort: Pulmonary effort is normal. No respiratory distress.     Breath sounds: Normal breath sounds.  Abdominal:     Palpations: Abdomen is soft.     Tenderness: There is no abdominal tenderness.  Musculoskeletal:        General: No swelling.     Cervical back: Neck supple.       Feet:  Skin:    General: Skin is warm and dry.     Capillary Refill: Capillary refill takes less than 2 seconds.  Neurological:     Mental Status: She is alert.  Psychiatric:        Mood and Affect: Mood normal.     (all labs ordered are listed, but only abnormal results are displayed) Labs Reviewed - No data to display  EKG: None  Radiology: DG Foot 2 Views Left Result Date: 08/22/2024 CLINICAL DATA:  Left foot pain. EXAM: LEFT FOOT - 2 VIEW COMPARISON:  None Available. FINDINGS: There is no evidence of acute fracture or dislocation. Mild degenerative changes are noted at the first metatarsophalangeal joint and midfoot. There is mild calcaneal spurring. Fixation hardware is noted in the distal tibia and fibula. Soft tissue swelling is present at the fifth metatarsophalangeal joint. IMPRESSION: 1. No acute osseous abnormality. 2. Soft tissue swelling at the fifth metatarsophalangeal joint. Electronically Signed   By: Leita Birmingham M.D.   On: 08/22/2024 16:44     Procedures   Medications Ordered in the ED - No data to display                                  Medical Decision Making Amount and/or Complexity of Data Reviewed Radiology: ordered.   Tenderness-bunion, small puncture wound superior to bunion. No erythema, swelling, warmth, of flucuance.   Referred to her established podiatrist for care.  Patient in no distress and overall condition improved here in the ED. Detailed discussions were had with the patient regarding  current findings, and need for close f/u with PCP or on call doctor. The patient has been instructed to return immediately if the symptoms worsen in any way for re-evaluation. Patient verbalized understanding and is in agreement with current care plan. All questions answered prior to discharge.      Final diagnoses:  Left foot pain  Bunion    ED Discharge Orders     None          Elnor Bernarda SQUIBB, DO 09/15/24 2216  "

## 2024-08-22 NOTE — ED Notes (Signed)
 Pt alert and oriented X 4 at the time of discharge. RR even and unlabored. No acute distress noted. Pt verbalized understanding of discharge instructions as discussed. Pt ambulatory to lobby at time of discharge.

## 2024-08-22 NOTE — ED Provider Notes (Incomplete)
 [place] [facility] [place] Provider Note   CSN: [id number] Arrival date & time: 08/22/24  1333     Patient presents with: Foot Pain   Tina Ramos is a 68 y.o. female.  {Add pertinent medical, surgical, social history, OB history to HPI:32947}  Foot Pain       Prior to Admission medications   Medication Sig Start Date End Date Taking? Authorizing Provider  atorvastatin  (LIPITOR) 20 MG tablet Take 20 mg by mouth daily.    [provider]  desvenlafaxine (PRISTIQ) 50 MG 24 hr tablet Take 50 mg by mouth daily. 03/28/20   [provider]  estradiol (ESTRACE) 1 MG tablet Take 1 mg by mouth daily.    [provider]  gabapentin  (NEURONTIN ) 100 MG capsule Take 100 mg by mouth 3 (three) times daily.    [provider]  insulin  glargine (LANTUS) 100 UNIT/ML injection Inject 0-20 Units into the skin See admin instructions. Sliding scale: if BG is less than  < 150, 0 units and if > 150, 4 units. Pt increases by 1 unit per increase of 25 mmol/L in BG.    [provider]  mupirocin  cream (BACTROBAN ) 2 % Apply 1 application topically 2 (two) times daily. For 7 days Patient not taking: Reported on 05/03/2020 03/17/15   Lenor Hollering, MD  ondansetron  (ZOFRAN  ODT) 4 MG disintegrating tablet 4mg  ODT q4 hours prn nausea/vomit Patient not taking: Reported on 05/03/2020 10/02/18   Emil Share, DO  oxybutynin  (DITROPAN  XL) 15 MG 24 hr tablet Take 1 tablet by mouth daily as needed (overactive bladder).  05/02/20   [provider]  oxyCODONE  (OXY IR/ROXICODONE ) 5 MG immediate release tablet Take 1 pills every 4-6 hrs as needed for pain 05/07/20   McBane, Caroline N, PA-C  pantoprazole  (PROTONIX ) 40 MG tablet Take 40 mg by mouth daily.    [provider]  promethazine  (PHENERGAN ) 25 MG tablet Take 1 tablet (25 mg total) by mouth every 6 (six) hours as needed for nausea. Patient not taking: Reported on 05/03/2020  11/27/14   Charlyn Sora, MD  QUEtiapine  (SEROQUEL ) 100 MG tablet Take 300 mg by mouth at bedtime.     [provider]  rizatriptan (MAXALT) 10 MG tablet Take 10-20 mg by mouth daily as needed for migraine.     [provider]  sevelamer  carbonate (RENVELA ) 800 MG tablet Take 1,600 mg by mouth 3 (three) times daily with meals.     [provider]  torsemide  (DEMADEX ) 20 MG tablet Take 20 mg by mouth 3 (three) times a week. 04/15/20   [provider]    Allergies: Pentazocine, Propoxyphene, Sulfa antibiotics, Codeine, and Other    Review of Systems  Updated Vital Signs BP (!) 158/61 (BP Location: Right Arm)   Pulse 82   Temp 98.3 F (36.8 C)   Resp 18   Ht 5' 2 (1.575 m)   Wt 62 kg   SpO2 99%   BMI 25.00 kg/m   Physical Exam  (all labs ordered are listed, but only abnormal results are displayed) Labs Reviewed - No data to display  EKG: None  Radiology: DG Foot 2 Views Left Result Date: 08/22/2024 CLINICAL DATA:  Left foot pain. EXAM: LEFT FOOT - 2 VIEW COMPARISON:  None Available. FINDINGS: There is no evidence of acute fracture or dislocation. Mild degenerative changes are noted at the first metatarsophalangeal joint and midfoot. There is mild calcaneal spurring. Fixation hardware is  noted in the distal tibia and fibula. Soft tissue swelling is present at the fifth metatarsophalangeal joint. IMPRESSION: 1. No acute osseous abnormality. 2. Soft tissue swelling at the fifth metatarsophalangeal joint. Electronically Signed   By: Leita Birmingham M.D.   On: 08/22/2024 16:44    {Document cardiac monitor, telemetry assessment procedure when appropriate:32947} Procedures   Medications Ordered in the ED - No data to display    {Click here for ABCD2, HEART and other calculators REFRESH Note before signing:1}                              Medical Decision Making Amount and/or Complexity of Data Reviewed Radiology: ordered.   ***  {Document  critical care time when appropriate  Document review of labs and clinical decision tools ie CHADS2VASC2, etc  Document your independent review of radiology images and any outside records  Document your discussion with family members, caretakers and with consultants  Document social determinants of health affecting pt's care  Document your decision making why or why not admission, treatments were needed:32947:::1}   Final diagnoses:  None    ED Discharge Orders     None

## 2024-08-22 NOTE — Discharge Instructions (Signed)
 Please take gabapentin  for nerve pain. 100mg  three times day.  Follow up with your podiatrist for treatment plan.

## 2024-08-22 NOTE — ED Triage Notes (Signed)
 Pt states left foot pain that is lateral and painful with walking  Ambulatory to triage  States feels like nerve pain, no known injury    Pt diabetic and on dialysis

## 2024-10-01 ENCOUNTER — Emergency Department (HOSPITAL_BASED_OUTPATIENT_CLINIC_OR_DEPARTMENT_OTHER)

## 2024-10-01 ENCOUNTER — Emergency Department (HOSPITAL_BASED_OUTPATIENT_CLINIC_OR_DEPARTMENT_OTHER)
Admission: EM | Admit: 2024-10-01 | Discharge: 2024-10-01 | Disposition: A | Discharge status: Home / Self Care | Attending: Emergency Medicine | Admitting: Emergency Medicine

## 2024-10-01 ENCOUNTER — Other Ambulatory Visit: Payer: Self-pay

## 2024-10-01 ENCOUNTER — Encounter (HOSPITAL_BASED_OUTPATIENT_CLINIC_OR_DEPARTMENT_OTHER): Payer: Self-pay | Admitting: Emergency Medicine

## 2024-10-01 DIAGNOSIS — E1142 Type 2 diabetes mellitus with diabetic polyneuropathy: Secondary | ICD-10-CM | POA: Diagnosis not present

## 2024-10-01 DIAGNOSIS — N186 End stage renal disease: Secondary | ICD-10-CM | POA: Insufficient documentation

## 2024-10-01 DIAGNOSIS — Z794 Long term (current) use of insulin: Secondary | ICD-10-CM | POA: Insufficient documentation

## 2024-10-01 DIAGNOSIS — L84 Corns and callosities: Secondary | ICD-10-CM | POA: Diagnosis not present

## 2024-10-01 DIAGNOSIS — L97521 Non-pressure chronic ulcer of other part of left foot limited to breakdown of skin: Secondary | ICD-10-CM | POA: Insufficient documentation

## 2024-10-01 DIAGNOSIS — E1122 Type 2 diabetes mellitus with diabetic chronic kidney disease: Secondary | ICD-10-CM | POA: Diagnosis not present

## 2024-10-01 DIAGNOSIS — L98491 Non-pressure chronic ulcer of skin of other sites limited to breakdown of skin: Secondary | ICD-10-CM

## 2024-10-01 DIAGNOSIS — M7989 Other specified soft tissue disorders: Secondary | ICD-10-CM | POA: Diagnosis present

## 2024-10-01 MED ORDER — DOXYCYCLINE HYCLATE 100 MG PO CAPS: 100.0000 mg | ORAL_CAPSULE | Freq: Two times a day (BID) | ORAL | 0 refills | Status: AC

## 2024-10-01 NOTE — ED Triage Notes (Signed)
 Pt states has something on side of foot. Multiple ulcers noted in triage. On outer part of left foot an ulcer that is black in the center and red and swollen about 3cm. Pt is diabetic. And is dialysis  pt.

## 2024-10-01 NOTE — Discharge Instructions (Addendum)
 Corn pad to the affect area cleanse with original dial liquid soap. Follow up with podiatry.

## 2024-10-01 NOTE — ED Notes (Signed)
 Arrived to room with EDP.  Pts left foot ulcer was cleansed and abx ointment was placed, same was covered in dressing and secured.

## 2024-10-01 NOTE — ED Provider Notes (Signed)
 " [place] [facility] [place] Provider Note   CSN: [id number] Arrival date & time: 10/01/24  9677     Patient presents with: No chief complaint on file.   Tina Ramos is a 69 y.o. female.    Foot Pain This is a recurrent problem. The problem occurs constantly. The problem has been rapidly worsening. Pertinent negatives include no chest pain, no abdominal pain, no headaches and no shortness of breath. Nothing aggravates the symptoms. Nothing relieves the symptoms. She has tried nothing for the symptoms. The treatment provided no relief.  Patient with DM and CKD and a ghistory of DM ulcers of the feet followed by podiatry presents with elevated lesion on the lateral aspect of the left foot a the MTP.  Also has a healing ulcer on the sole of the foot.  No fevers.  No discharge.      Past Medical History:  Diagnosis Date   Chronic kidney disease    Cirrhosis (HCC)    Cirrhosis (HCC)    Diabetes mellitus without complication (HCC)    Diabetic neuropathy (HCC)    ESRD (end stage renal disease) (HCC)    Gastroparesis    Hyperlipemia    Interstitial cystitis    Migraine    UTI (lower urinary tract infection)      Prior to Admission medications  Medication Sig Start Date End Date Taking? Authorizing Provider  doxycycline  (VIBRAMYCIN ) 100 MG capsule Take 1 capsule (100 mg total) by mouth 2 (two) times daily. One po bid x 7 days 10/01/24  Yes Oriyah Lamphear, MD  atorvastatin  (LIPITOR) 20 MG tablet Take 20 mg by mouth daily.    [provider]  desvenlafaxine (PRISTIQ) 50 MG 24 hr tablet Take 50 mg by mouth daily. 03/28/20   [provider]  estradiol (ESTRACE) 1 MG tablet Take 1 mg by mouth daily.    [provider]  gabapentin  (NEURONTIN ) 100 MG capsule Take 100 mg by mouth 3 (three) times daily.    [provider]  insulin  glargine (LANTUS) 100 UNIT/ML injection Inject 0-20 Units into the skin See admin instructions.  Sliding scale: if BG is less than  < 150, 0 units and if > 150, 4 units. Pt increases by 1 unit per increase of 25 mmol/L in BG.    [provider]  mupirocin  cream (BACTROBAN ) 2 % Apply 1 application topically 2 (two) times daily. For 7 days Patient not taking: Reported on 05/03/2020 03/17/15   Lenor Hollering, MD  ondansetron  (ZOFRAN  ODT) 4 MG disintegrating tablet 4mg  ODT q4 hours prn nausea/vomit Patient not taking: Reported on 05/03/2020 10/02/18   Emil Share, DO  oxybutynin  (DITROPAN  XL) 15 MG 24 hr tablet Take 1 tablet by mouth daily as needed (overactive bladder).  05/02/20   [provider]  oxyCODONE  (OXY IR/ROXICODONE ) 5 MG immediate release tablet Take 1 pills every 4-6 hrs as needed for pain 05/07/20   McBane, Caroline N, PA-C  pantoprazole  (PROTONIX ) 40 MG tablet Take 40 mg by mouth daily.    [provider]  promethazine  (PHENERGAN ) 25 MG tablet Take 1 tablet (25 mg total) by mouth every 6 (six) hours as needed for nausea. Patient not taking: Reported on 05/03/2020 11/27/14   Charlyn Sora, MD  QUEtiapine  (SEROQUEL ) 100 MG tablet Take 300 mg by mouth at bedtime.     [provider]  rizatriptan (MAXALT) 10 MG tablet Take 10-20 mg by mouth daily as needed for migraine.  [provider]  sevelamer  carbonate (RENVELA ) 800 MG tablet Take 1,600 mg by mouth 3 (three) times daily with meals.     [provider]  torsemide  (DEMADEX ) 20 MG tablet Take 20 mg by mouth 3 (three) times a week. 04/15/20   [provider]    Allergies: Pentazocine, Propoxyphene, Sulfa antibiotics, Codeine, and Other    Review of Systems  Respiratory:  Negative for shortness of breath.   Cardiovascular:  Negative for chest pain.  Gastrointestinal:  Negative for abdominal pain.  Skin:  Positive for color change.  Neurological:  Negative for headaches.  All other systems reviewed and are negative.   Updated Vital Signs BP (!) 171/69 (BP Location:  Right Arm)   Pulse 78   Temp 99.1 F (37.3 C) (Oral)   Resp 20   Ht 5' 2 (1.575 m)   Wt 56.7 kg   SpO2 96%   BMI 22.86 kg/m   Physical Exam Vitals and nursing note reviewed.  Constitutional:      General: She is not in acute distress.    Appearance: Normal appearance. She is well-developed.  HENT:     Head: Normocephalic and atraumatic.     Nose: Nose normal.  Eyes:     Pupils: Pupils are equal, round, and reactive to light.  Cardiovascular:     Rate and Rhythm: Normal rate and regular rhythm.     Pulses: Normal pulses.     Heart sounds: Normal heart sounds.  Pulmonary:     Effort: Pulmonary effort is normal. No respiratory distress.     Breath sounds: Normal breath sounds.  Abdominal:     General: Bowel sounds are normal. There is no distension.     Palpations: Abdomen is soft.     Tenderness: There is no abdominal tenderness. There is no guarding or rebound.  Musculoskeletal:        General: Normal range of motion.     Cervical back: Normal range of motion and neck supple.     Right foot: Normal capillary refill. No bony tenderness or crepitus. Normal pulse.     Left foot: Normal capillary refill. No bony tenderness or crepitus. Normal pulse.       Feet:  Skin:    General: Skin is warm and dry.     Capillary Refill: Capillary refill takes less than 2 seconds.     Findings: No erythema or rash.  Neurological:     General: No focal deficit present.     Mental Status: She is alert.     Deep Tendon Reflexes: Reflexes normal.  Psychiatric:        Mood and Affect: Mood normal.     (all labs ordered are listed, but only abnormal results are displayed) Labs Reviewed - No data to display  EKG: None  Radiology: DG Foot Complete Left Result Date: 10/01/2024 EXAM: 3 OR MORE VIEW(S) XRAY OF THE LEFT FOOT 10/01/2024 04:50:45 AM COMPARISON: 08/22/2024 CLINICAL HISTORY: Foot ulcer and pain. History of diabetes. FINDINGS: BONES AND JOINTS: Plate and screw fixation of  distal fibula and tibia noted. Stable first metatarsophalangeal and midfoot degenerative changes. No acute fracture. No focal bone erosions identified. No malalignment. SOFT TISSUES: Soft tissue swelling lateral to fifth metatarsophalangeal joint with tiny soft tissue calcification. IMPRESSION: 1. Soft tissue swelling lateral to the fifth metatarsophalangeal joint with tiny soft tissue calcification. 2. No bone erosions of osteomyelitis. Electronically signed by: Waddell Calk MD 10/01/2024 05:33 AM EST RP Workstation: [id number]  Procedures   Medications Ordered in the ED - No data to display                                  Medical Decision Making Patient with foot lesion   Amount and/or Complexity of Data Reviewed External Data Reviewed: notes.    Details: Previous notes reviewed  Radiology: ordered and independent interpretation performed.    Details: No osteomyelitis  Risk Prescription drug management. Risk Details: Patient had wound care performed in the ED by EDP and medic.  Bacitracin applied with cushion. I have advised diabetic shoes as the tight mesh shoe the patient is wearing is grating on the area and undoubtedly caused the blood blister. There is no warmth nor fluctuance but given DM have started antibiotics.  Cleansing instructions given with Dial soap.  Patient follow up with podiatry for ongoing care.  Patient verbalizes understanding of all verbal and written instructions and agrees to follow up.  Stable for discharge.      Final diagnoses:  Corn of foot  Callous ulcer, limited to breakdown of skin (HCC)    No signs of systemic illness or infection. The patient is nontoxic-appearing on exam and vital signs are within normal limits.  I have reviewed the triage vital signs and the nursing notes. Pertinent labs & imaging results that were available during my care of the patient were reviewed by me and considered in my medical decision making (see chart for details).  After history, exam, and medical workup I feel the patient has been appropriately medically screened and is safe for discharge home. Pertinent diagnoses were discussed with the patient. Patient was given return precautions.    ED Discharge Orders          Ordered    doxycycline  (VIBRAMYCIN ) 100 MG capsule  2 times daily        10/01/24 0610               Charday Capetillo, MD 10/01/24 [id number]  "
